# Patient Record
Sex: Female | Born: 1985 | Race: White | Hispanic: No | Marital: Single | Smoking: Current every day smoker
Health system: Southern US, Community
[De-identification: ages and names within clinical notes are randomized; demographics above are authoritative.]

## PROBLEM LIST (undated history)

## (undated) ENCOUNTER — Inpatient Hospital Stay (HOSPITAL_COMMUNITY): Payer: Self-pay

## (undated) DIAGNOSIS — J45909 Unspecified asthma, uncomplicated: Secondary | ICD-10-CM

## (undated) DIAGNOSIS — A4902 Methicillin resistant Staphylococcus aureus infection, unspecified site: Secondary | ICD-10-CM

## (undated) DIAGNOSIS — D649 Anemia, unspecified: Secondary | ICD-10-CM

## (undated) HISTORY — PX: WISDOM TOOTH EXTRACTION: SHX21

## (undated) HISTORY — DX: Methicillin resistant Staphylococcus aureus infection, unspecified site: A49.02

---
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
## 2004-01-15 ENCOUNTER — Emergency Department (HOSPITAL_COMMUNITY): Admission: EM | Admit: 2004-01-15 | Discharge: 2004-01-16 | Payer: Self-pay | Admitting: Emergency Medicine

## 2005-07-28 ENCOUNTER — Emergency Department (HOSPITAL_COMMUNITY): Admission: EM | Admit: 2005-07-28 | Discharge: 2005-07-28 | Payer: Self-pay | Admitting: Emergency Medicine

## 2005-08-17 ENCOUNTER — Emergency Department (HOSPITAL_COMMUNITY): Admission: EM | Admit: 2005-08-17 | Discharge: 2005-08-18 | Payer: Self-pay | Admitting: Emergency Medicine

## 2006-05-30 ENCOUNTER — Emergency Department (HOSPITAL_COMMUNITY): Admission: EM | Admit: 2006-05-30 | Discharge: 2006-05-30 | Payer: Self-pay | Admitting: Emergency Medicine

## 2006-10-12 DIAGNOSIS — J45909 Unspecified asthma, uncomplicated: Secondary | ICD-10-CM | POA: Insufficient documentation

## 2006-10-12 DIAGNOSIS — E669 Obesity, unspecified: Secondary | ICD-10-CM

## 2006-10-12 DIAGNOSIS — L708 Other acne: Secondary | ICD-10-CM

## 2011-12-12 ENCOUNTER — Encounter (HOSPITAL_COMMUNITY): Payer: Self-pay | Admitting: *Deleted

## 2011-12-12 ENCOUNTER — Emergency Department (HOSPITAL_COMMUNITY)
Admission: EM | Admit: 2011-12-12 | Discharge: 2011-12-12 | Disposition: A | Discharge status: Home / Self Care | Payer: Self-pay | Attending: Emergency Medicine | Admitting: Emergency Medicine

## 2011-12-12 DIAGNOSIS — R059 Cough, unspecified: Secondary | ICD-10-CM | POA: Insufficient documentation

## 2011-12-12 DIAGNOSIS — F17200 Nicotine dependence, unspecified, uncomplicated: Secondary | ICD-10-CM | POA: Insufficient documentation

## 2011-12-12 DIAGNOSIS — R05 Cough: Secondary | ICD-10-CM | POA: Insufficient documentation

## 2011-12-12 MED ORDER — AZITHROMYCIN 500 MG PO TABS: ORAL_TABLET | ORAL | Status: DC

## 2011-12-12 MED ORDER — IBUPROFEN 800 MG PO TABS: 800.0000 mg | ORAL_TABLET | Freq: Three times a day (TID) | ORAL | Status: AC

## 2011-12-12 MED ORDER — AZITHROMYCIN 250 MG PO TABS
500.0000 mg | ORAL_TABLET | Freq: Once | ORAL | Status: AC
  Administered 2011-12-12: 500 mg via ORAL
  Filled 2011-12-12: qty 2

## 2011-12-12 NOTE — Discharge Instructions (Signed)

## 2011-12-12 NOTE — ED Provider Notes (Signed)
History     CSN: [id number]  Arrival date & time 12/12/11  1722   First MD Initiated Contact with Patient 12/12/11 1851      Chief Complaint  Patient presents with  . Fever    (Consider location/radiation/quality/duration/timing/severity/associated sxs/prior treatment) Patient is a 26 y.o. female presenting with cough. The history is provided by the patient.  Cough This is a new problem. The current episode started yesterday. The problem occurs every few minutes. The problem has not changed since onset.The cough is productive of sputum. The maximum temperature recorded prior to her arrival was 100 to 100.9 F. The fever has been present for 1 to 2 days. Associated symptoms include chills, headaches, rhinorrhea and myalgias. Pertinent negatives include no ear pain, no sore throat, no shortness of breath and no wheezing. Associated symptoms comments: Body aches. Treatments tried: tylenol. The treatment provided no relief. She is a smoker. Her past medical history does not include bronchitis, pneumonia or asthma.    History reviewed. No pertinent past medical history.  History reviewed. No pertinent past surgical history.  History reviewed. No pertinent family history.  History  Substance Use Topics  . Smoking status: Current Some Day Smoker  . Smokeless tobacco: Not on file  . Alcohol Use: No    OB History    Grav Para Term Preterm Abortions TAB SAB Ect Mult Living                  Review of Systems  Constitutional: Positive for fever and chills. Negative for appetite change.  HENT: Positive for congestion and rhinorrhea. Negative for ear pain, sore throat, trouble swallowing, neck pain and neck stiffness.   Respiratory: Positive for cough. Negative for chest tightness, shortness of breath and wheezing.   Gastrointestinal: Positive for nausea. Negative for vomiting and abdominal pain.  Genitourinary: Negative for dysuria.  Musculoskeletal: Positive for myalgias. Negative for  arthralgias.  Skin: Negative for color change and rash.  Neurological: Positive for headaches. Negative for dizziness, weakness and numbness.  Hematological: Negative for adenopathy.  All other systems reviewed and are negative.    Allergies  Review of patient's allergies indicates no known allergies.  Home Medications   Current Outpatient Rx  Name Route Sig Dispense Refill  . ACETAMINOPHEN 500 MG PO TABS Oral Take 1,000 mg by mouth 2 (two) times daily as needed. For fever      BP 133/74  Pulse 74  Temp 97.7 F (36.5 C)  Resp 20  Ht 5\' 5"  (1.651 m)  Wt 275 lb 1.6 oz (124.785 kg)  BMI 45.78 kg/m2  SpO2 100%  LMP 12/12/2011  Physical Exam  Nursing note and vitals reviewed. Constitutional: She is oriented to person, place, and time. She appears well-developed and well-nourished. No distress.  HENT:  Head: Normocephalic and atraumatic.  Right Ear: Tympanic membrane and ear canal normal.  Left Ear: Tympanic membrane and ear canal normal.  Nose: Mucosal edema and rhinorrhea present.  Mouth/Throat: Uvula is midline, oropharynx is clear and moist and mucous membranes are normal.  Neck: Normal range of motion. Neck supple.  Cardiovascular: Normal rate, regular rhythm and normal heart sounds.   Pulmonary/Chest: Effort normal. No respiratory distress. She exhibits no tenderness.       Coarse lungs sounds bilaterally w/o rales or wheezing  Abdominal: Soft. She exhibits no distension. There is no tenderness.  Musculoskeletal: Normal range of motion. She exhibits no edema and no tenderness.  Lymphadenopathy:    She has no cervical  adenopathy.  Neurological: She is alert and oriented to person, place, and time. She exhibits normal muscle tone. Coordination normal.  Skin: Skin is warm and dry.    ED Course  Procedures (including critical care time)       MDM    Previous medical charts, nursing notes and vitals signs from this visit were reviewed by me   All laboratory  results and/or imaging results performed on this visit, if applicable, were reviewed by me and discussed with the patient and/or parent as well as recommendation for follow-up    MEDICATIONS GIVEN IN ED: zithromax  Patient is alert, non-toxic appearing.  No meningeal signs, abd is soft NT. No hypoxia.   Ambulates well.  Coarse lungs sounds bilaterally w/o rales or wheezing.  Pt agrees to f/u with her PMD or return here if her sx's worsen      PRESCRIPTIONS GIVEN AT DISCHARGE:  z-pack, ibuprofen      Pt stable in ED with no significant deterioration in condition. Pt feels improved after observation and/or treatment in ED. Patient / Family / Caregiver understand and agree with initial ED impression and plan with expectations set for ED visit.  Patient agrees to return to ED for any worsening symptoms        Braylea Brancato L. [place], [place] 12/16/11 2142

## 2011-12-12 NOTE — ED Notes (Signed)
Sick since yesterday with cough,productive green brown sputum,  Headache, Fever.nausea

## 2011-12-17 NOTE — ED Provider Notes (Signed)
Medical screening examination/treatment/procedure(s) were performed by non-physician practitioner and as supervising physician I was immediately available for consultation/collaboration.  Dahlia Nifong S. Fritzie Prioleau, MD 12/17/11 2319 

## 2012-04-26 ENCOUNTER — Encounter (HOSPITAL_COMMUNITY): Payer: Self-pay | Admitting: Emergency Medicine

## 2012-04-26 ENCOUNTER — Emergency Department (HOSPITAL_COMMUNITY)
Admission: EM | Admit: 2012-04-26 | Discharge: 2012-04-26 | Disposition: A | Discharge status: Home / Self Care | Payer: Self-pay | Attending: Emergency Medicine | Admitting: Emergency Medicine

## 2012-04-26 DIAGNOSIS — J40 Bronchitis, not specified as acute or chronic: Secondary | ICD-10-CM | POA: Insufficient documentation

## 2012-04-26 DIAGNOSIS — F17200 Nicotine dependence, unspecified, uncomplicated: Secondary | ICD-10-CM | POA: Insufficient documentation

## 2012-04-26 MED ORDER — AMOXICILLIN 500 MG PO CAPS: 500.0000 mg | ORAL_CAPSULE | Freq: Three times a day (TID) | ORAL | Status: AC

## 2012-04-26 NOTE — ED Notes (Signed)
Pt c/o sore throat, sob, and productive cough.

## 2012-04-26 NOTE — ED Provider Notes (Signed)
History     CSN: [id number]  Arrival date & time 04/26/12  1404   First MD Initiated Contact with Patient 04/26/12 1454      Chief Complaint  Patient presents with  . Sore Throat  . Shortness of Breath  . Cough    (Consider location/radiation/quality/duration/timing/severity/associated sxs/prior treatment) Patient is a 26 y.o. female presenting with pharyngitis, shortness of breath, and cough. The history is provided by the patient (pt complains of cough and sore throat). No language interpreter was used.  Sore Throat This is a new problem. The current episode started 12 to 24 hours ago. The problem occurs hourly. The problem has not changed since onset.Associated symptoms include shortness of breath. Pertinent negatives include no chest pain, no abdominal pain and no headaches. Nothing aggravates the symptoms. Nothing relieves the symptoms. She has tried nothing for the symptoms. The treatment provided no relief.  Shortness of Breath  Associated symptoms include sore throat, cough and shortness of breath. Pertinent negatives include no chest pain.  Cough Associated symptoms include sore throat and shortness of breath. Pertinent negatives include no chest pain and no headaches.    History reviewed. No pertinent past medical history.  History reviewed. No pertinent past surgical history.  History reviewed. No pertinent family history.  History  Substance Use Topics  . Smoking status: Current Some Day Smoker  . Smokeless tobacco: Not on file  . Alcohol Use: No    OB History    Grav Para Term Preterm Abortions TAB SAB Ect Mult Living                  Review of Systems  Constitutional: Negative for fatigue.  HENT: Positive for sore throat. Negative for congestion, sinus pressure and ear discharge.   Eyes: Negative for discharge.  Respiratory: Positive for cough and shortness of breath.   Cardiovascular: Negative for chest pain.  Gastrointestinal: Negative for abdominal  pain and diarrhea.  Genitourinary: Negative for frequency and hematuria.  Musculoskeletal: Negative for back pain.  Skin: Negative for rash.  Neurological: Negative for seizures and headaches.  Hematological: Negative.   Psychiatric/Behavioral: Negative for hallucinations.    Allergies  Review of patient's allergies indicates no known allergies.  Home Medications   Current Outpatient Rx  Name Route Sig Dispense Refill  . GUAIFENESIN-DM 100-10 MG/5ML PO SYRP Oral Take 5 mLs by mouth 3 (three) times daily as needed. For congestion    . AMOXICILLIN 500 MG PO CAPS Oral Take 1 capsule (500 mg total) by mouth 3 (three) times daily. 21 capsule 0    BP 149/98  Pulse 73  Temp 98.8 F (37.1 C) (Oral)  Resp 20  Ht 5\' 5"  (1.651 m)  Wt 258 lb (117.028 kg)  BMI 42.93 kg/m2  SpO2 100%  LMP 04/25/2012  Physical Exam  Constitutional: She is oriented to person, place, and time. She appears well-developed.  HENT:  Head: Normocephalic and atraumatic.  Eyes: Conjunctivae normal and EOM are normal. No scleral icterus.  Neck: Neck supple. No thyromegaly present.  Cardiovascular: Normal rate and regular rhythm.  Exam reveals no gallop and no friction rub.   No murmur heard. Pulmonary/Chest: No stridor. She has no wheezes. She has no rales. She exhibits no tenderness.  Abdominal: She exhibits no distension. There is no tenderness. There is no rebound.  Musculoskeletal: Normal range of motion. She exhibits no edema.  Lymphadenopathy:    She has no cervical adenopathy.  Neurological: She is oriented to person, place, and  time. Coordination normal.  Skin: No rash noted. No erythema.  Psychiatric: She has a normal mood and affect. Her behavior is normal.    ED Course  Procedures (including critical care time)  Labs Reviewed - No data to display No results found.   1. Bronchitis       MDM           Benny Lennert, MD 04/26/12 1500

## 2012-05-30 ENCOUNTER — Encounter (HOSPITAL_COMMUNITY): Payer: Self-pay | Admitting: *Deleted

## 2012-05-30 ENCOUNTER — Emergency Department (HOSPITAL_COMMUNITY)
Admission: EM | Admit: 2012-05-30 | Discharge: 2012-05-30 | Disposition: A | Discharge status: Home / Self Care | Payer: Self-pay | Attending: Emergency Medicine | Admitting: Emergency Medicine

## 2012-05-30 DIAGNOSIS — K0889 Other specified disorders of teeth and supporting structures: Secondary | ICD-10-CM

## 2012-05-30 DIAGNOSIS — X58XXXA Exposure to other specified factors, initial encounter: Secondary | ICD-10-CM | POA: Insufficient documentation

## 2012-05-30 DIAGNOSIS — S025XXA Fracture of tooth (traumatic), initial encounter for closed fracture: Secondary | ICD-10-CM | POA: Insufficient documentation

## 2012-05-30 DIAGNOSIS — F17200 Nicotine dependence, unspecified, uncomplicated: Secondary | ICD-10-CM | POA: Insufficient documentation

## 2012-05-30 DIAGNOSIS — K029 Dental caries, unspecified: Secondary | ICD-10-CM | POA: Insufficient documentation

## 2012-05-30 DIAGNOSIS — K047 Periapical abscess without sinus: Secondary | ICD-10-CM

## 2012-05-30 MED ORDER — HYDROCODONE-ACETAMINOPHEN 5-325 MG PO TABS: ORAL_TABLET | ORAL | Status: DC

## 2012-05-30 MED ORDER — PENICILLIN V POTASSIUM 500 MG PO TABS: 500.0000 mg | ORAL_TABLET | Freq: Four times a day (QID) | ORAL | Status: AC

## 2012-05-30 MED ORDER — HYDROCODONE-ACETAMINOPHEN 5-325 MG PO TABS
2.0000 | ORAL_TABLET | Freq: Once | ORAL | Status: AC
  Administered 2012-05-30: 2 via ORAL
  Filled 2012-05-30: qty 2

## 2012-05-30 NOTE — ED Provider Notes (Signed)
History     CSN: [id number]  Arrival date & time 05/30/12  4098   First MD Initiated Contact with Patient 05/30/12 [phone]      Chief Complaint  Patient presents with  . Dental Pain    (Consider location/radiation/quality/duration/timing/severity/associated sxs/prior treatment) The history is provided by the patient, a parent and medical records.    Dorothy Flowers is a 26 y.o. female presents to the emergency department complaining of dental pain on the top L.  The onset of the symptoms was abrupt starting 4 days ago.  The patient has associated L ear pain, decreased appetite, pain in the L jaw, fever, headache.  The symptoms have been  persistent, gradually worsened.  Eating, drinking, smoking, talking makes the symptoms worse and nothing makes symptoms better.  The patient denies chills, sore throat, nausea, vomiting, diarrhea, abdominal pain.  Pt states the tooth broke about 1 week ago while she was eating.   Pt had a fever to 100.8 yesterday.  She has been taking ibuprofen 600mg  TID.  Pt has an appointment on Oct 25th with dentistry.  Pt denies Hx of coryza, cough, congestion, rhinorrhea, sore throat.    History reviewed. No pertinent past medical history.  History reviewed. No pertinent past surgical history.  No family history on file.  History  Substance Use Topics  . Smoking status: Current Some Day Smoker -- 0.5 packs/day    Types: Cigarettes  . Smokeless tobacco: Not on file  . Alcohol Use: No    OB History    Grav Para Term Preterm Abortions TAB SAB Ect Mult Living                  Review of Systems  Constitutional: Positive for fever. Negative for chills and appetite change.  HENT: Positive for ear pain, facial swelling and dental problem. Negative for nosebleeds, rhinorrhea, drooling, trouble swallowing, neck pain, neck stiffness and postnasal drip.   Eyes: Negative for pain and redness.  Respiratory: Negative for cough and wheezing.   Cardiovascular:  Negative for chest pain.  Gastrointestinal: Negative for nausea, vomiting and abdominal pain.  Skin: Negative for color change and rash.  Neurological: Positive for headaches. Negative for weakness and light-headedness.  All other systems reviewed and are negative.    Allergies  Review of patient's allergies indicates no known allergies.  Home Medications   Current Outpatient Rx  Name Route Sig Dispense Refill  . IBUPROFEN 200 MG PO TABS Oral Take 600 mg by mouth every 6 (six) hours as needed. For pain    . HYDROCODONE-ACETAMINOPHEN 5-325 MG PO TABS  1-2 tablets as needed for pain control 21 tablet 0  . PENICILLIN V POTASSIUM 500 MG PO TABS Oral Take 1 tablet (500 mg total) by mouth 4 (four) times daily. 40 tablet 0    BP 145/94  Temp 98.3 F (36.8 C) (Oral)  Resp 18  SpO2 96%  Physical Exam  Nursing note and vitals reviewed. Constitutional: She appears well-developed and well-nourished.  HENT:  Head: Normocephalic.  Right Ear: Tympanic membrane, external ear and ear canal normal. No drainage. No mastoid tenderness. Tympanic membrane is not bulging.  Left Ear: Tympanic membrane, external ear and ear canal normal. No drainage. No mastoid tenderness. Tympanic membrane is not bulging.  Nose: Nose normal. Right sinus exhibits no maxillary sinus tenderness and no frontal sinus tenderness. Left sinus exhibits no maxillary sinus tenderness and no frontal sinus tenderness.  Mouth/Throat: Uvula is midline, oropharynx is clear and moist  and mucous membranes are normal. No oral lesions. Abnormal dentition. Dental caries present. No dental abscesses, uvula swelling or lacerations. No oropharyngeal exudate, posterior oropharyngeal edema, posterior oropharyngeal erythema or tonsillar abscesses.         Broken molar on Left upper with dental carie Generally poor dentition with multiple other caries throughout   Eyes: Conjunctivae normal are normal. Right eye exhibits no discharge. Left eye  exhibits no discharge.  Neck: Normal range of motion. Neck supple.  Cardiovascular: Normal rate, regular rhythm and normal heart sounds.   Pulmonary/Chest: Effort normal and breath sounds normal. No respiratory distress. She has no wheezes.  Abdominal: Soft. Bowel sounds are normal. She exhibits no distension. There is no tenderness.  Lymphadenopathy:    She has no cervical adenopathy.  Neurological: She is alert.  Skin: Skin is warm and dry.  Psychiatric: She has a normal mood and affect.    ED Course  Procedures (including critical care time)  Labs Reviewed - No data to display No results found.   1. Pain, dental   2. Dental caries   3. Dental infection       MDM  Dorothy Flowers presents with dental pain and associated L ear pain.  Dental infection likely.  No evidence of dental abscess that needs draining.  Exam unconcerning for Ludwig's angina or spread of infection.  Will treat with penicillin and pain medicine.  Urged patient to keep her currently schedule appointment and follow-up with dentist.  I have also discussed reasons to return immediately to the ER.  Patient expresses understanding and agrees with plan.  1. Medications: penicillin, vicoden 2. Treatment: rest, drink plenty of fluids, take medications as prescribed 3. Follow Up: with dentistry          Dierdre Forth, PA-C 05/30/12 [phone]

## 2012-05-30 NOTE — ED Notes (Signed)
NAD noted at time of d/c home 

## 2012-05-30 NOTE — ED Provider Notes (Signed)
Medical screening examination/treatment/procedure(s) were performed by non-physician practitioner and as supervising physician I was immediately available for consultation/collaboration.  Ethelda Chick, MD 05/30/12 [phone]

## 2012-05-30 NOTE — ED Notes (Signed)
Pt to ED c/o L ear pain 4 days ago. Pain has increased to L jaw.

## 2012-05-30 NOTE — ED Notes (Signed)
Pt c/o L upper dental pain x 4 days.  States on Friday she felt something "crack".  Pain 8/10, has been taking 600 mg ibuprofen, 3 times a day.  Denies n/v, weakness, SOB.

## 2012-06-08 ENCOUNTER — Encounter (HOSPITAL_COMMUNITY): Payer: Self-pay | Admitting: Emergency Medicine

## 2012-06-08 ENCOUNTER — Emergency Department (HOSPITAL_COMMUNITY)
Admission: EM | Admit: 2012-06-08 | Discharge: 2012-06-08 | Disposition: A | Discharge status: Home / Self Care | Payer: Self-pay | Attending: Emergency Medicine | Admitting: Emergency Medicine

## 2012-06-08 DIAGNOSIS — Z87891 Personal history of nicotine dependence: Secondary | ICD-10-CM | POA: Insufficient documentation

## 2012-06-08 DIAGNOSIS — K089 Disorder of teeth and supporting structures, unspecified: Secondary | ICD-10-CM | POA: Insufficient documentation

## 2012-06-08 DIAGNOSIS — K0889 Other specified disorders of teeth and supporting structures: Secondary | ICD-10-CM

## 2012-06-08 NOTE — ED Notes (Signed)
Reports 2 weeks ago was dx with dental abscess, sent home on pcn and finished abx, went to dentist last Wednesday, to have tooth pulled, and wouldn't pull it d/t it was still infected--pt states dentist sent her back her for abx

## 2012-06-08 NOTE — ED Provider Notes (Signed)
History     CSN: [id number]  Arrival date & time 06/08/12  1513   First MD Initiated Contact with Patient 06/08/12 1530      Chief Complaint  Patient presents with  . Dental Problem    (Consider location/radiation/quality/duration/timing/severity/associated sxs/prior treatment) HPI Pt seen in the ED for dental pain about 10 days ago given RX for pain meds and Abx, states she saw a dentist in [place] 2 days ago who would not pull her tooth due to infection and reportedly advised her to come back to the ED for additional treatment even though she did not finish her antibiotics until today. Pain is moderate aching. Swelling is improved.   History reviewed. No pertinent past medical history.  History reviewed. No pertinent past surgical history.  History reviewed. No pertinent family history.  History  Substance Use Topics  . Smoking status: Former Smoker    Quit date: 05/30/2012  . Smokeless tobacco: Not on file  . Alcohol Use: No    OB History    Grav Para Term Preterm Abortions TAB SAB Ect Mult Living                  Review of Systems All other systems reviewed and are negative except as noted in HPI.   Allergies  Review of patient's allergies indicates no known allergies.  Home Medications   Current Outpatient Rx  Name Route Sig Dispense Refill  . IBUPROFEN 200 MG PO TABS Oral Take 1,000 mg by mouth every 6 (six) hours as needed. For pain      BP 137/94  Pulse 78  Temp 98 F (36.7 C) (Oral)  Resp 19  SpO2 99%  LMP 05/29/2012  Physical Exam  Constitutional: She is oriented to person, place, and time. She appears well-developed and well-nourished.  HENT:  Head: Normocephalic and atraumatic.       Multiple dental caries, no significant gingival swelling and no abscess to L upper 2nd molar  Neck: Neck supple.  Pulmonary/Chest: Effort normal.  Neurological: She is alert and oriented to person, place, and time. No cranial nerve deficit.  Psychiatric: She  has a normal mood and affect. Her behavior is normal.    ED Course  Procedures (including critical care time)  Labs Reviewed - No data to display No results found.   No diagnosis found.    MDM  Pt finished PCN today, no need for additional Rx. Advised dental follow-up with a different dentist who would be willing to treat her.        Brittain Hosie B. Bernette Mayers, MD 06/08/12 1539

## 2014-03-19 ENCOUNTER — Encounter (HOSPITAL_COMMUNITY): Payer: Self-pay | Admitting: Emergency Medicine

## 2014-03-19 ENCOUNTER — Emergency Department (HOSPITAL_COMMUNITY)
Admission: EM | Admit: 2014-03-19 | Discharge: 2014-03-19 | Disposition: A | Discharge status: Home / Self Care | Payer: Self-pay | Attending: Emergency Medicine | Admitting: Emergency Medicine

## 2014-03-19 DIAGNOSIS — K089 Disorder of teeth and supporting structures, unspecified: Secondary | ICD-10-CM | POA: Insufficient documentation

## 2014-03-19 DIAGNOSIS — F17200 Nicotine dependence, unspecified, uncomplicated: Secondary | ICD-10-CM | POA: Insufficient documentation

## 2014-03-19 DIAGNOSIS — K0530 Chronic periodontitis, unspecified: Secondary | ICD-10-CM | POA: Insufficient documentation

## 2014-03-19 DIAGNOSIS — K029 Dental caries, unspecified: Secondary | ICD-10-CM | POA: Insufficient documentation

## 2014-03-19 MED ORDER — OXYCODONE-ACETAMINOPHEN 5-325 MG PO TABS
1.0000 | ORAL_TABLET | Freq: Once | ORAL | Status: AC
  Administered 2014-03-19: 1 via ORAL
  Filled 2014-03-19: qty 1

## 2014-03-19 MED ORDER — AMOXICILLIN 500 MG PO CAPS: 500.0000 mg | ORAL_CAPSULE | Freq: Three times a day (TID) | ORAL | Status: DC

## 2014-03-19 MED ORDER — AMOXICILLIN 250 MG PO CAPS
500.0000 mg | ORAL_CAPSULE | Freq: Once | ORAL | Status: AC
  Administered 2014-03-19: 500 mg via ORAL
  Filled 2014-03-19: qty 2

## 2014-03-19 MED ORDER — TRAMADOL HCL 50 MG PO TABS: 50.0000 mg | ORAL_TABLET | Freq: Four times a day (QID) | ORAL | Status: DC | PRN

## 2014-03-19 NOTE — ED Provider Notes (Signed)
CSN: [id number]     Arrival date & time 03/19/14  1028 History   First MD Initiated Contact with Patient 03/19/14 1031     Chief Complaint  Patient presents with  . Dental Pain     (Consider location/radiation/quality/duration/timing/severity/associated sxs/prior Treatment) Patient is a 28 y.o. female presenting with tooth pain. The history is provided by the patient.  Dental Pain Location:  Upper and lower Upper teeth location:  1/RU 3rd molar and 16/LU 3rd molar Lower teeth location:  17/LL 3rd molar and 31/RL 2nd molar Quality:  Throbbing Severity:  Severe Onset quality:  Gradual Duration:  3 days Timing:  Constant Progression:  Worsening Chronicity:  New  Dorothy Flowers is a age female who presents to the ED with dental pain that started 3 days ago. She could not get an appointment with a dentist for 3 weeks.   History reviewed. No pertinent past medical history. History reviewed. No pertinent past surgical history. No family history on file. History  Substance Use Topics  . Smoking status: Current Every Day Smoker    Types: Cigarettes  . Smokeless tobacco: Not on file  . Alcohol Use: No   OB History   Grav Para Term Preterm Abortions TAB SAB Ect Mult Living                 Review of Systems Negative except as stated in HPI   Allergies  Review of patient's allergies indicates no known allergies.  Home Medications   Prior to Admission medications   Medication Sig Start Date End Date Taking? Authorizing Provider  ibuprofen (ADVIL,MOTRIN) 200 MG tablet Take 1,000 mg by mouth every 6 (six) hours as needed. For pain    Historical Provider, MD   BP 119/75  Pulse 100  Temp(Src) 98.8 F (37.1 C) (Oral)  Resp 16  Ht 5\' 6"  (1.676 m)  Wt 270 lb (122.471 kg)  BMI 43.60 kg/m2  SpO2 100%  LMP 02/26/2014 Physical Exam  Nursing note and vitals reviewed. Constitutional: She is oriented to person, place, and time. She appears well-developed and well-nourished.    HENT:  Mouth/Throat: Uvula is midline, oropharynx is clear and moist and mucous membranes are normal.    There is decay and tenderness of the third molars upper and lower. There is swelling and tenderness of the gum surrounding the molars.   Eyes: EOM are normal.  Neck: Neck supple.  Pulmonary/Chest: Effort normal.  Abdominal: Soft. There is no tenderness.  Musculoskeletal: Normal range of motion.  Lymphadenopathy:    She has cervical adenopathy.  Neurological: She is alert and oriented to person, place, and time. No cranial nerve deficit.  Skin: Skin is warm and dry.    ED Course  Procedures   MDM  28 y.o. female with pain and decay of the third upper and lower molars with swelling of the gum surrounding the teeth. Will treat for infection and pain. The patient will keep the follow up appointment she has with the dentist. She will return here as needed. Discussed with the patient and all questioned fully answered.    Medication List    TAKE these medications       amoxicillin 500 MG capsule  Commonly known as:  AMOXIL  Take 1 capsule (500 mg total) by mouth 3 (three) times daily.     traMADol 50 MG tablet  Commonly known as:  ULTRAM  Take 1 tablet (50 mg total) by mouth every 6 (six) hours as needed.  ASK your doctor about these medications       ibuprofen 200 MG tablet  Commonly known as:  ADVIL,MOTRIN  Take 1,000 mg by mouth every 6 (six) hours as needed. For pain           Janne Napoleon, [place] 03/19/14 [phone]

## 2014-03-19 NOTE — Discharge Instructions (Signed)
See the dentist as soon as possible for follow up.   Dental Caries Dental caries (also called tooth decay) is the most common oral disease. It can occur at any age but is more common in children and young adults.  HOW DENTAL CARIES DEVELOPS  The process of decay begins when bacteria and foods (particularly sugars and starches) combine in your mouth to produce plaque. Plaque is a substance that sticks to the hard, outer surface of a tooth (enamel). The bacteria in plaque produce acids that attack enamel. These acids may also attack the root surface of a tooth (cementum) if it is exposed. Repeated attacks dissolve these surfaces and create holes in the tooth (cavities). If left untreated, the acids destroy the other layers of the tooth.  RISK FACTORS  Frequent sipping of sugary beverages.   Frequent snacking on sugary and starchy foods, especially those that easily get stuck in the teeth.   Poor oral hygiene.   Dry mouth.   Substance abuse such as methamphetamine abuse.   Broken or poor-fitting dental restorations.   Eating disorders.   Gastroesophageal reflux disease (GERD).   Certain radiation treatments to the head and neck. SYMPTOMS In the early stages of dental caries, symptoms are seldom present. Sometimes white, chalky areas may be seen on the enamel or other tooth layers. In later stages, symptoms may include:  Pits and holes on the enamel.  Toothache after sweet, hot, or cold foods or drinks are consumed.  Pain around the tooth.  Swelling around the tooth. DIAGNOSIS  Most of the time, dental caries is detected during a regular dental checkup. A diagnosis is made after a thorough medical and dental history is taken and the surfaces of your teeth are checked for signs of dental caries. Sometimes special instruments, such as lasers, are used to check for dental caries. Dental X-ray exams may be taken so that areas not visible to the eye (such as between the contact  areas of the teeth) can be checked for cavities.  TREATMENT  If dental caries is in its early stages, it may be reversed with a fluoride treatment or an application of a remineralizing agent at the dental office. Thorough brushing and flossing at home is needed to aid these treatments. If it is in its later stages, treatment depends on the location and extent of tooth destruction:   If a small area of the tooth has been destroyed, the destroyed area will be removed and cavities will be filled with a material such as gold, silver amalgam, or composite resin.   If a large area of the tooth has been destroyed, the destroyed area will be removed and a cap (crown) will be fitted over the remaining tooth structure.   If the center part of the tooth (pulp) is affected, a procedure called a root canal will be needed before a filling or crown can be placed.   If most of the tooth has been destroyed, the tooth may need to be pulled (extracted). HOME CARE INSTRUCTIONS You can prevent, stop, or reverse dental caries at home by practicing good oral hygiene. Good oral hygiene includes:  Thoroughly cleaning your teeth at least twice a day with a toothbrush and dental floss.   Using a fluoride toothpaste. A fluoride mouth rinse may also be used if recommended by your dentist or health care provider.   Restricting the amount of sugary and starchy foods and sugary liquids you consume.   Avoiding frequent snacking on these foods  and sipping of these liquids.   Keeping regular visits with a dentist for checkups and cleanings. PREVENTION   Practice good oral hygiene.  Consider a dental sealant. A dental sealant is a coating material that is applied by your dentist to the pits and grooves of teeth. The sealant prevents food from being trapped in them. It may protect the teeth for several years.  Ask about fluoride supplements if you live in a community without fluorinated water or with water that has a  low fluoride content. Use fluoride supplements as directed by your dentist or health care provider.  Allow fluoride varnish applications to teeth if directed by your dentist or health care provider. Document Released: 04/23/2002 Document Revised: 12/16/2013 Document Reviewed: 08/03/2012 [facility] Patient Information 2015 [place], [place]. This information is not intended to replace advice given to you by your health care provider. Make sure you discuss any questions you have with your health care provider.  Dental Pain Toothache is pain in or around a tooth. It may get worse with chewing or with cold or heat.  HOME CARE  Your dentist may use a numbing medicine during treatment. If so, you may need to avoid eating until the medicine wears off. Ask your dentist about this.  Only take medicine as told by your dentist or doctor.  Avoid chewing food near the painful tooth until after all treatment is done. Ask your dentist about this. GET HELP RIGHT AWAY IF:   The problem gets worse or new problems appear.  You have a fever.  There is redness and puffiness (swelling) of the face, jaw, or neck.  You cannot open your mouth.  There is pain in the jaw.  There is very bad pain that is not helped by medicine. MAKE SURE YOU:   Understand these instructions.  Will watch your condition.  Will get help right away if you are not doing well or get worse. Document Released: 01/18/2008 Document Revised: 10/24/2011 Document Reviewed: 01/18/2008 [facility] Patient Information 2015 [place], [place]. This information is not intended to replace advice given to you by your health care provider. Make sure you discuss any questions you have with your health care provider.

## 2014-03-19 NOTE — ED Notes (Signed)
Pt states dental pain began Monday, states shooting pain to ear and head. Unable to see dentist x 3 weeks.

## 2014-03-25 NOTE — ED Provider Notes (Signed)
Medical screening examination/treatment/procedure(s) were performed by non-physician practitioner and as supervising physician I was immediately available for consultation/collaboration.   EKG Interpretation None        Cherene Dobbins L Makaia Rappa, MD 03/25/14 0910 

## 2014-05-11 ENCOUNTER — Emergency Department (HOSPITAL_COMMUNITY)
Admission: EM | Admit: 2014-05-11 | Discharge: 2014-05-11 | Disposition: A | Discharge status: Home / Self Care | Payer: Self-pay | Attending: Emergency Medicine | Admitting: Emergency Medicine

## 2014-05-11 ENCOUNTER — Encounter (HOSPITAL_COMMUNITY): Payer: Self-pay | Admitting: Emergency Medicine

## 2014-05-11 DIAGNOSIS — Z792 Long term (current) use of antibiotics: Secondary | ICD-10-CM | POA: Insufficient documentation

## 2014-05-11 DIAGNOSIS — T7840XA Allergy, unspecified, initial encounter: Secondary | ICD-10-CM

## 2014-05-11 DIAGNOSIS — K0889 Other specified disorders of teeth and supporting structures: Secondary | ICD-10-CM

## 2014-05-11 DIAGNOSIS — K029 Dental caries, unspecified: Secondary | ICD-10-CM | POA: Insufficient documentation

## 2014-05-11 DIAGNOSIS — R221 Localized swelling, mass and lump, neck: Secondary | ICD-10-CM

## 2014-05-11 DIAGNOSIS — T361X5A Adverse effect of cephalosporins and other beta-lactam antibiotics, initial encounter: Secondary | ICD-10-CM | POA: Insufficient documentation

## 2014-05-11 DIAGNOSIS — R220 Localized swelling, mass and lump, head: Secondary | ICD-10-CM | POA: Insufficient documentation

## 2014-05-11 DIAGNOSIS — F17200 Nicotine dependence, unspecified, uncomplicated: Secondary | ICD-10-CM | POA: Insufficient documentation

## 2014-05-11 DIAGNOSIS — K089 Disorder of teeth and supporting structures, unspecified: Secondary | ICD-10-CM | POA: Insufficient documentation

## 2014-05-11 MED ORDER — PREDNISONE 10 MG PO TABS
60.0000 mg | ORAL_TABLET | Freq: Once | ORAL | Status: AC
  Administered 2014-05-11: 60 mg via ORAL
  Filled 2014-05-11 (×2): qty 1

## 2014-05-11 MED ORDER — PREDNISONE 20 MG PO TABS: 40.0000 mg | ORAL_TABLET | Freq: Every day | ORAL | Status: DC

## 2014-05-11 MED ORDER — FAMOTIDINE 20 MG PO TABS
40.0000 mg | ORAL_TABLET | Freq: Once | ORAL | Status: AC
  Administered 2014-05-11: 40 mg via ORAL
  Filled 2014-05-11: qty 2

## 2014-05-11 MED ORDER — CLINDAMYCIN HCL 150 MG PO CAPS: ORAL_CAPSULE | ORAL | Status: DC

## 2014-05-11 MED ORDER — DIPHENHYDRAMINE HCL 25 MG PO CAPS
50.0000 mg | ORAL_CAPSULE | Freq: Once | ORAL | Status: AC
  Administered 2014-05-11: 50 mg via ORAL
  Filled 2014-05-11: qty 2

## 2014-05-11 MED ORDER — HYDROCODONE-ACETAMINOPHEN 5-325 MG PO TABS: ORAL_TABLET | ORAL | Status: DC

## 2014-05-11 MED ORDER — CLINDAMYCIN HCL 150 MG PO CAPS
450.0000 mg | ORAL_CAPSULE | Freq: Once | ORAL | Status: AC
  Administered 2014-05-11: 450 mg via ORAL
  Filled 2014-05-11: qty 3

## 2014-05-11 NOTE — ED Provider Notes (Signed)
CSN: [id number]     Arrival date & time 05/11/14  1714 History   First MD Initiated Contact with Patient 05/11/14 1749     Chief Complaint  Patient presents with  . Facial Swelling  . Allergic Reaction      HPI Pt was seen at 1755. Per pt and her mother, c/o gradual onset and persistence of constant right sided facial "pain" and "swelling" for the past 3 days. Pt's mother states pt had her right upper wisdom tooth extracted 2 weeks ago and is due to have several more teeth extracted in the upcoming weeks. Pt states she has been having mild pain in her right upper and lower teeth for the past 5 days, so she called her Dentist. Pt's Dentist started her on Keflex 3 days ago. Pt states since she started the keflex, she feels her right face and tongue have "gotten swollen." Pt left work today to come to the ED for evaluation. Pt's mother states she is concerned that pt "is allergic to the antibiotic" and "the antibiotic isn't working anyway." Denies fevers, no rash/hives, no dysphagia, no hoarse voice, no SOB/wheezing.    History reviewed. No pertinent past medical history.  Past Surgical History  Procedure Laterality Date  . Wisdom tooth extraction     History  Substance Use Topics  . Smoking status: Current Every Day Smoker -- 0.50 packs/day    Types: Cigarettes  . Smokeless tobacco: Not on file  . Alcohol Use: No    Review of Systems ROS: Statement: All systems negative except as marked or noted in the HPI; Constitutional: Negative for fever and chills. ; ; Eyes: Negative for eye pain and discharge. ; ; ENMT: Positive for "right sided facial swelling" and "right sided tongue swelling," dental caries, dental hygiene poor and toothache. Negative for ear pain, bleeding gums, dental injury, facial deformity, hoarseness, nasal congestion, sinus pressure, sore throat, throat swelling. ; ; Cardiovascular: Negative for chest pain, palpitations, diaphoresis, dyspnea and peripheral edema. ; ;  Respiratory: Negative for cough, wheezing and stridor. ; ; Gastrointestinal: Negative for nausea, vomiting, diarrhea and abdominal pain. ; ; Genitourinary: Negative for dysuria, flank pain and hematuria. ; ; Musculoskeletal: Negative for back pain and neck pain. ; ; Skin: Negative for rash and skin lesion. ; ; Neuro: Negative for headache, lightheadedness and neck stiffness.      Allergies  Review of patient's allergies indicates no known allergies.  Home Medications   Prior to Admission medications   Medication Sig Start Date End Date Taking? Authorizing Provider  amoxicillin (AMOXIL) 500 MG capsule Take 1 capsule (500 mg total) by mouth 3 (three) times daily. 03/19/14   Hope Orlene Och, NP  ibuprofen (ADVIL,MOTRIN) 200 MG tablet Take 1,000 mg by mouth every 6 (six) hours as needed. For pain    Historical Provider, MD  traMADol (ULTRAM) 50 MG tablet Take 1 tablet (50 mg total) by mouth every 6 (six) hours as needed. 03/19/14   Hope Orlene Och, NP   BP 127/93  Pulse 70  Temp(Src) 98.1 F (36.7 C) (Oral)  Resp 19  Ht  (1.676 m)  Wt 270 lb (122.471 kg)  BMI 43.60 kg/m2  SpO2 100%  LMP 04/21/2014 Physical Exam 1800: Physical examination: Vital signs and O2 SAT: Reviewed; Constitutional: Well developed, Well nourished, Well hydrated, In no acute distress; Head and Face: Normocephalic, Atraumatic. No facial swelling. ; Eyes: EOMI, PERRL, No scleral icterus; ENMT: Mouth and pharynx normal, Poor dentition, Widespread dental decay,  Left TM normal, Right TM normal, Mucous membranes moist, +upper right wisdom tooth extraction site healed without open wound, no drainage, no fluctuance, no edema, no erythema, no ecchymosis. +lower right 2nd molar, upper and lower left molars with extensive dental decay.  No gingival erythema, edema, fluctuance, or drainage.  No intra-oral edema. No submandibular or sublingual edema. When pt is distracted she has no hoarse or muffled voice. No drooling, no stridor. No  trismus. ; Neck: Supple, Full range of motion, No lymphadenopathy; Cardiovascular: Regular rate and rhythm, No murmur, rub, or gallop; Respiratory: Breath sounds clear & equal bilaterally, No rales, rhonchi, wheezes, Normal respiratory effort/excursion; Chest: Nontender, Movement normal; Extremities: Pulses normal, No tenderness, No edema; Neuro: AA&Ox3, Major CN grossly intact.  No gross focal motor or sensory deficits in extremities. Climbs on and off stretcher easily by herself. Gait steady.; Skin: Color normal, No diffuse rash, No petechiae, Warm, Dry    ED Course  Procedures     MDM  MDM Reviewed: previous chart, nursing note and vitals    1805:  Pt was mumbling quietly initially, but when I told her that I could not understand her, she spoke in a loud clear voice. Pt continued to speak with a clear voice throughout my H&P. No intra-oral edema noted and pt does not have rash/hives. Wisdom tooth extraction site well healed and without signs of infection. Pt with multiple dental caries with significant dental decay. No signs of Ludwig's at this time. Source of pt's symptoms is likely her multiple dental caries, but confounding factor of recent new abx (keflex) is present. Will tx for presumed allergic reaction, as well as change abx to clindamycin. Will dose meds in ED and continue to observe. Pt and her mother agreeable with plan.    2030:  Pt states she "feels better now" and wants to go home. No intra-oral edema, no SOB, no rash. Voice clear, lungs CTA bilat, Sats 98% R/A. Pt has tol PO well without choking/gagging/dysphagia. Dx and testing d/w pt and family.  Questions answered.  Verb understanding, agreeable to d/c home with outpt f/u.     Samuel Jester, DO 05/14/14 [place]

## 2014-05-11 NOTE — Discharge Instructions (Signed)
°Emergency Department Resource Guide °1) Find a Doctor and Pay Out of Pocket °Although you won't have to find out who is covered by your insurance plan, it is a good idea to ask around and get recommendations. You will then need to call the office and see if the doctor you have chosen will accept you as a new patient and what types of options they offer for patients who are self-pay. Some doctors offer discounts or will set up payment plans for their patients who do not have insurance, but you will need to ask so you aren't surprised when you get to your appointment. ° °2) Contact Your Local Health Department °Not all health departments have doctors that can see patients for sick visits, but many do, so it is worth a call to see if yours does. If you don't know where your local health department is, you can check in your phone book. The CDC also has a tool to help you locate your state's health department, and many state websites also have listings of all of their local health departments. ° °3) Find a Walk-in Clinic °If your illness is not likely to be very severe or complicated, you may want to try a walk in clinic. These are popping up all over the country in pharmacies, drugstores, and shopping centers. They're usually staffed by nurse practitioners or physician assistants that have been trained to treat common illnesses and complaints. They're usually fairly quick and inexpensive. However, if you have serious medical issues or chronic medical problems, these are probably not your best option. ° °No Primary Care Doctor: °- Call [facility] at  832-8000 - they can help you locate a primary care doctor that  accepts your insurance, provides certain services, etc. °- Physician Referral Service- 1-800-533-3463 ° °Chronic Pain Problems: °Organization         Address  Phone   Notes  °[facility]  (336) 297-2271 Patients need to be referred by their primary care doctor.  ° °Medication  Assistance: °Organization         Address  Phone   Notes  °[place] [facility] [address] °[place], [place] [address] (336) 641-8030 --Must be a resident of [place] °-- Must have NO insurance coverage whatsoever (no Medicaid/ Medicare, etc.) °-- The pt. MUST have a primary care doctor that directs their care regularly and follows them in the community °  °[facility]  (866) 331-1348   °[employer]  (888) 892-1162   ° °Agencies that provide inexpensive medical care: °Organization         Address  Phone   Notes  °[facility]  (336) 832-8035   °[facility]    (336) 832-7272   °[facility] [address] °[place], [place] [address] (336) 832-4777   °[facility] [address], °[place] (336) 271-4999   °[facility]    (336) 373-0678   °[facility]    (336) 272-1050   °[facility] ° [address], [place] Phone:  (336) 832-4444, Fax:  (336) 832-4440 Hours of Operation:  9 am - 6 pm, M-F.  Also accepts Medicaid/Medicare and self-pay.  °[facility] ° [address], [place] Phone: (336) 832-3150, Fax: (336) 832-3151. Hours of Operation:  8:30 am - 5:30 pm, M-F.  Also accepts Medicaid and self-pay.  °[facility] [place] [address], [place] Phone: (336) 878-6027   °[facility] [address], [place], [place] (336)723-1848, Ext. 123 Mondays & Thursdays: 7-9 AM.  First 15 patients are seen on a first come, first serve basis. °  ° °Medicaid-accepting [place] Providers: ° °Organization         Address  Phone   Notes  °[facility] [address], [place] (336) 641-2100 Also accepts self-pay patients.  °[facility] [address], [place] ° (336) 856-9996   °[facility] [address], [place]  (336) 288-8857   °[facility] [address] [place] [address], [place] (336) 299-7000   °Veita Bland [address], [place]  ° (336) 373-1557 Only accepts [place] Access Medicaid patients after they have their name applied to their card.  ° °Self-Pay (no insurance) in [place]: ° °Organization         Address  Phone   Notes  °Sickle Cell Patients, [facility] [address], [place] (336) 832-1970   °[facility] [address], [place] (336) 832-4400   °[facility] ° [address], [place] (336) 992-4800   °[facility]/Dr. Osei-Bonsu ° [address] [place] [address], [place] or [address], [place] (336) 841-8500 Phone number for both [place] and [place] locations is the same.  °[facility] [address], [place] (336) 299-0000   °[facility] [address] [place] [address], [place] or [address] (336) 852-7530 °(336) 878-2260   °[facility] [address], [place] (336) 350-1642, phone; (336) 294-5005, fax Sees patients 1st and 3rd Saturday of every month.  Must not qualify for public or private insurance (i.e. Medicaid, Medicare, [place] Health Choice, Veterans' Benefits) • Household income should be no more than 200% of the poverty level •The clinic cannot treat you if you are pregnant or think you are pregnant • Sexually transmitted diseases are not treated at the clinic.  ° ° °Dental Care: °Organization         Address  Phone  Notes  °[place] [facility] [address], [place] (336) 641-6152 Accepts children up to age 21 who are enrolled in Medicaid or [place] Health Choice; pregnant women with a Medicaid card; and children who have applied for Medicaid or [place] Health Choice, but were declined, whose parents can pay a reduced fee at time of service.  °[place]  [facility] [place]  [address], [place] (336) 641-7733 Accepts children up to age 21 who are enrolled in Medicaid or [place] Health Choice; pregnant women with a Medicaid card; and children who have applied for Medicaid or [place] Health Choice, but were declined, whose parents can pay a reduced fee at time of service.  °[facility] ° [address], [place] (336) 641-4533 Patients are seen by appointment only. Walk-ins are not accepted. [facility] will see patients 18 years of age and older. °Monday - Tuesday (8am-5pm) °Most Wednesdays (8:30-5pm) °$30 per visit, cash only  °[facility] ° [address], [place] (336) 641-4533 Patients are seen by appointment only. Walk-ins are not accepted. [facility] will see patients 18 years of age and older. °One   Wednesday Evening (Monthly: Volunteer Based).  $30 per visit, cash only  °[facility]  (919) 537-3737 for adults; Children under age 4, call Graduate Pediatric Dentistry at ([phone]) [phone]. Children aged 4-14, please call (919) [phone] to request a pediatric application. ° Dental services are provided in all areas of dental care including fillings, crowns and bridges, complete and partial dentures, implants, gum treatment, root canals, and extractions. Preventive care is also provided. Treatment is provided to both adults and children. °Patients are selected via a lottery and there is often a waiting list. °  °[facility] [address], °[place] ° (336) 763-8833 [email or web address] °  °[facility] [address], [place], [place] (336)723-1848, Ext. 123 Second and Fourth Thursday of each month, opens at 6:30 AM; Clinic ends at 9 AM.  Patients are seen on a first-come first-served basis, and a limited number are seen during each clinic.  ° °[facility] ° [address], [place], [place] (336) 723-7904    Eligibility Requirements °You must have lived in [place], [place], or [place] counties for at least the last three months. °  You cannot be eligible for state or federal sponsored healthcare insurance, including Veterans Administration, Medicaid, or Medicare. °  You generally cannot be eligible for healthcare insurance through your employer.  °  How to apply: °Eligibility screenings are held every Tuesday and Wednesday afternoon from 1:00 pm until 4:00 pm. You do not need an appointment for the interview!  °[facility] [address], [place], [place] 336-631-2330   °[facility]  336-342-8273   °[facility]  336-703-3100   °[facility]  336-570-6415   ° °Behavioral Health Resources in the Community: °Intensive Outpatient Programs °Organization         Address  Phone  Notes  °[facility] [address], [place], [place] 336-878-6098   °[facility] [address], [place], [place] 336-832-9800   °[facility] [address], [place], [place] ° 336-882-2125   °[facility] [address],  °[place], [place] 1-800-853-5163 or 336-641-4981   °Substance Abuse Resources °Organization         Address  Phone  Notes  °[facility]  336-882-2125   °[facility]  336-784-9470   °[facility]  336-285-9073   °[facility]  336-845-3988   °Residential & Outpatient Substance Abuse Program  1-800-659-3381   °Psychological Services °Organization         Address  Phone  Notes  °[facility]  336- 832-9600   °[facility]  336- 378-7881   °[facility] [address], [place] 1-800-853-5163 or 336-641-4981   ° °Mobile Crisis Teams °Organization         Address  Phone  Notes  °[facility], Mobile Crisis Care Unit  1-877-626-1772   °Assertive °[facility] ° [address]  [place], [place] 336-834-9664   °Sharon DeEsch [address] °[place] 336-554-5454   ° °Self-Help/Support Groups °Organization         Address  Phone             Notes  °[facility]  - variety of support groups  336- 373-1402 Call for more information  °Narcotics Anonymous (NA), [facility] [address], °[place]  2 meetings at this location  ° °  Residential Treatment Programs Organization         Address  Phone  Notes  [facility] [address]    [place]  1-610-960-4540   [facility]  [address] [place] [address], [place], [place] 478-295-6213   [facility] [address] [place], [place] 086-578-4696 Admissions: 8am-3pm M-F  [facility] [address]    [place], [place] 295-284-1324   [facility] [address] [place], [place], [place] 401-027-2536   [facility] [address]  [place], [place] 644-034-7425   [facility] - Intensive Outpatient [address], [place], [place] 956-387-5643   [facility] ([facility] [address] [place].,  [place], [place] 3-295-188-4166 or 445-140-9160   [facility] ([facility] [address] [place], [place] 323-557-3220 Accepts Medicaid  [facility] [address]  [place] 2-542-706-2376 Substance Abuse/Addiction Treatment   [facility]         Address  Phone  Notes  [facility]  318-662-4592   Angie Fava, PhD [address] [place] [place], [place]   682-825-9072 or 682-147-6969   [facility]   [address] [place], [place] 641-635-3299   [facility] [address] [place], [place] 364-402-6605 Insurance/Medicaid/sponsorship through [facility] [address]                                    [place], [place] 402-720-8031 Therapy/tele-psych/case    [facility] [address], [place] [phone]    Dr. Lolly Mustache  219-359-2208   [facility]  [employer] [facility] Dept. 1) [address] [place] 2) [address], [place] 3)  [address], [place] (551)[phone]  330-428-4385   [facility] Child Abuse Hotline 731-200-8620 or 360-386-7204 (After Hours)       Take the prescriptions as directed.  Take over the counter benadryl, as directed on packaging, as needed for itching, rash, or swelling.  If the benadryl is too sedating, take an over the counter non-sedating antihistamine such as claritin, allegra or zyrtec, as directed on packaging.  Call your regular medical doctor and your Dentist tomorrow to schedule a follow up appointment this week.  Return to the Emergency Department immediately sooner if worsening.

## 2014-05-11 NOTE — ED Notes (Signed)
Wisdom tooth pulled 2 weeks ago, been on antibiotics, right jaw pain, pt face is swollen, Work called mother felt like pt is having a reaction to antibiotic, pt is mumbling, difficult to talk due to pain and swelling in face and tongue swelling.

## 2014-07-28 ENCOUNTER — Other Ambulatory Visit: Payer: Self-pay | Admitting: Obstetrics and Gynecology

## 2014-07-28 DIAGNOSIS — O3680X0 Pregnancy with inconclusive fetal viability, not applicable or unspecified: Secondary | ICD-10-CM

## 2014-07-29 ENCOUNTER — Other Ambulatory Visit: Payer: Self-pay | Admitting: Obstetrics and Gynecology

## 2014-07-29 ENCOUNTER — Ambulatory Visit (INDEPENDENT_AMBULATORY_CARE_PROVIDER_SITE_OTHER): Payer: Medicaid Other

## 2014-07-29 ENCOUNTER — Encounter: Payer: Self-pay | Admitting: Obstetrics and Gynecology

## 2014-07-29 DIAGNOSIS — O3680X0 Pregnancy with inconclusive fetal viability, not applicable or unspecified: Secondary | ICD-10-CM

## 2014-07-29 NOTE — Progress Notes (Signed)
U/S(7+2wks)-single IUP with +FCA noted, FHR-159 bpm, cx appears closed, Rt ovary with 3.0cm Corpus Luteum noted, no free fluid noted within the pelvis, CRL c/w LMP dates

## 2014-08-05 ENCOUNTER — Encounter: Payer: Self-pay | Admitting: Advanced Practice Midwife

## 2014-08-05 ENCOUNTER — Other Ambulatory Visit (HOSPITAL_COMMUNITY)
Admission: RE | Admit: 2014-08-05 | Discharge: 2014-08-05 | Disposition: A | Discharge status: Home / Self Care | Payer: Medicaid Other | Source: Ambulatory Visit | Attending: Advanced Practice Midwife | Admitting: Advanced Practice Midwife

## 2014-08-05 ENCOUNTER — Ambulatory Visit (INDEPENDENT_AMBULATORY_CARE_PROVIDER_SITE_OTHER): Payer: Medicaid Other | Admitting: Advanced Practice Midwife

## 2014-08-05 VITALS — BP 120/60 | Wt 275.0 lb

## 2014-08-05 DIAGNOSIS — Z3401 Encounter for supervision of normal first pregnancy, first trimester: Secondary | ICD-10-CM

## 2014-08-05 DIAGNOSIS — Z0283 Encounter for blood-alcohol and blood-drug test: Secondary | ICD-10-CM

## 2014-08-05 DIAGNOSIS — Z113 Encounter for screening for infections with a predominantly sexual mode of transmission: Secondary | ICD-10-CM

## 2014-08-05 DIAGNOSIS — Z331 Pregnant state, incidental: Secondary | ICD-10-CM

## 2014-08-05 DIAGNOSIS — Z131 Encounter for screening for diabetes mellitus: Secondary | ICD-10-CM

## 2014-08-05 DIAGNOSIS — Z118 Encounter for screening for other infectious and parasitic diseases: Secondary | ICD-10-CM

## 2014-08-05 DIAGNOSIS — Z3400 Encounter for supervision of normal first pregnancy, unspecified trimester: Secondary | ICD-10-CM | POA: Insufficient documentation

## 2014-08-05 DIAGNOSIS — Z01419 Encounter for gynecological examination (general) (routine) without abnormal findings: Secondary | ICD-10-CM | POA: Diagnosis present

## 2014-08-05 DIAGNOSIS — Z1159 Encounter for screening for other viral diseases: Secondary | ICD-10-CM

## 2014-08-05 DIAGNOSIS — Z1389 Encounter for screening for other disorder: Secondary | ICD-10-CM

## 2014-08-05 DIAGNOSIS — Z3682 Encounter for antenatal screening for nuchal translucency: Secondary | ICD-10-CM

## 2014-08-05 DIAGNOSIS — Z1371 Encounter for nonprocreative screening for genetic disease carrier status: Secondary | ICD-10-CM

## 2014-08-05 DIAGNOSIS — Z114 Encounter for screening for human immunodeficiency virus [HIV]: Secondary | ICD-10-CM

## 2014-08-05 DIAGNOSIS — Z0184 Encounter for antibody response examination: Secondary | ICD-10-CM

## 2014-08-05 LAB — POCT URINALYSIS DIPSTICK
Blood, UA: NEGATIVE
Glucose, UA: NEGATIVE
KETONES UA: NEGATIVE
LEUKOCYTES UA: NEGATIVE
NITRITE UA: NEGATIVE
PROTEIN UA: NEGATIVE

## 2014-08-05 LAB — CBC
HEMATOCRIT: 38.1 % (ref 36.0–46.0)
Hemoglobin: 12.6 g/dL (ref 12.0–15.0)
MCH: 28.9 pg (ref 26.0–34.0)
MCHC: 33.1 g/dL (ref 30.0–36.0)
MCV: 87.4 fL (ref 78.0–100.0)
MPV: 9.5 fL (ref 9.4–12.4)
Platelets: 376 10*3/uL (ref 150–400)
RBC: 4.36 MIL/uL (ref 3.87–5.11)
RDW: 13.3 % (ref 11.5–15.5)
WBC: 12.8 10*3/uL — ABNORMAL HIGH (ref 4.0–10.5)

## 2014-08-05 MED ORDER — ANTIPYRINE-BENZOCAINE 5.4-1.4 % OT SOLN: 3.0000 [drp] | OTIC | Status: DC | PRN

## 2014-08-05 NOTE — Progress Notes (Signed)
  Subjective:    Dorothy Flowers is a G1P0 161w2d being seen today for her first obstetrical visit.  Her obstetrical history is significant for nothing.  Pregnancy history fully reviewed. She does not have a boyfriend and is not gay.  Was a virgin, but wanted to have a baby, so had intercourse with her "best friend" so she could get pregnant.  He plans on helping raise the baby.  Pt's mom also plans on helping raise  The baby  Patient reports cold sx with ear pain for 1 day.  Filed Vitals:   08/05/14 0916  BP: 120/60  Weight: 275 lb (124.739 kg)    HISTORY: OB History  Gravida Para Term Preterm AB SAB TAB Ectopic Multiple Living  1             # Outcome Date GA Lbr Len/2nd Weight Sex Delivery Anes PTL Lv  1 Current              History reviewed. No pertinent past medical history. Past Surgical History  Procedure Laterality Date  . Wisdom tooth extraction     Family History  Problem Relation Age of Onset  . Hypertension Mother   . Diabetes Maternal Aunt   . Diabetes Maternal Grandmother      Exam       Pelvic Exam:    Perineum: Normal Perineum   Vulva: normal   Vagina:  normal mucosa, normal discharge, no palpable nodules   Uterus Normal, Gravid, FH: ~8     Cervix: Normal.  1st pap smear ever collected   Adnexa: Not palpable   Urinary:  urethral meatus normal    System: Breast:  normal appearance, no masses or tenderness   Skin: normal coloration and turgor, no rashes    Neurologic: oriented, normal, normal mood   Extremities: normal strength, tone, and muscle mass   HEENT PERRLA.  Right ear has some fluid, no erythema   Mouth/Teeth mucous membranes moist, poor dentition   Neck supple and no masses   Cardiovascular: regular rate and rhythm   Respiratory:  appears well, vitals normal, no respiratory distress, acyanotic   Abdomen: soft, non-tender;  FHR: 159us          Assessment:    Pregnancy: G1P0 Patient Active Problem List   Diagnosis Date Noted   . Supervision of normal first pregnancy 08/05/2014  . OBESITY, NOS 10/12/2006  . ASTHMA, UNSPECIFIED 10/12/2006  . ACNE 10/12/2006        Plan:     Initial labs drawn. Continue prenatal vitamins  Auralgan for ear pain Salt water gargle/saline spray Problem list reviewed and updated  Reviewed n/v relief measures and warning s/s to report  Reviewed recommended weight gain based on pre-gravid BMI  Encouraged well-balanced diet Genetic Screening discussed Integrated Screen: requested.  Ultrasound discussed; fetal survey: requested.  Follow up in 4 weeks for NT/IT.  CRESENZO-DISHMAN,Jemia Fata 08/05/2014

## 2014-08-05 NOTE — Patient Instructions (Signed)
How a Baby Grows During Pregnancy Pregnancy begins when the female's sperm enters the female's egg. This happens in the fallopian tube and is called fertilization. The fertilized egg is called an embryo until it reaches 9 weeks from the time of fertilization. From 9 weeks until birth it is called a fetus. The fertilized egg moves down the tube into the uterus and attaches to the inside lining of the uterus.  The pregnant woman is responsible for the growth of the embryo/fetus by supplying nourishment and oxygen through the blood stream and placenta to the developing fetus. The uterus becomes larger and pops out from the abdomen more and more as the fetus develops and grows. A normal pregnancy lasts 280 days, with a range of 259 to 294 days, or 40 weeks. The pregnancy is divided up into three trimesters:  First trimester - 0 to 13 weeks.  Second trimester - 14 to 27 weeks.  Third trimester - 28 to 40 weeks. The day your baby is supposed to be born is called estimated date of confinement [facility]) or estimated date of delivery (EDD). GROWTH OF THE BABY MONTH BY MONTH 1. First Month: The fertilized egg attaches to the inside of the uterus and certain cells will form the placenta and others will develop into the fetus. The arms, legs, brain, spinal cord, lungs, and heart begin to develop. At the end of the first month the heart begins to beat. The embryo weighs less than an ounce and is  inch long. 2. Second Month: The bones can be seen, the inner ear, eye lids, hands and feet form and genitals develop. By the end of 8 weeks, all of the major organs are developing. The fetus now weighs less than an ounce and is one inch (2.54 cm) long. 3. Third Month: Teeth buds appear, all the internal organs are forming, bones and muscles begin to grow, the spine can flex and the skin is transparent. Finger and toe nails begin to form, the hands develop faster than the feet and the arms are longer than the legs at this point.  The fetus weighs a little more than an ounce (0.03 kg) and is 3 inches (8.89cm) long. 4. Fourth Month: The placenta is completely formed. The external sex organs, neck, outer ear, eyebrows, eyelids and fingernails are formed. The fetus can hear, swallow, flex its arms and legs and the kidney begins to produce urine. The skin is covered with a white waxy coating (vernix) and very thin hair (lanugo) is present. The fetus weighs 5 ounces (0.14kg) and is 6 to 7 inches (16.51cm) long. 5. Fifth Month: The fetus moves around more and can be felt for the first time (called quickening), sleeps and wakes up at times, may begin to suck its finger and the nails grow to the end of the fingers. The gallbladder is now functioning and helps to digest the nutrients, eggs are formed in the female and the testicles begin to drop down from the abdomen to the scrotum in the female. The fetus weighs  to 1 pound (0.45kg) and is 10 inches (25.4cm) long. 6. Sixth Month: The lungs are formed but the fetus does not breath yet. The eyes open, the brain develops more quickly at this time, one can detect finger and toe prints and thicker hair grows. The fetus weighs 1 to 1 pounds (0.68kg) and is 12 inches (30.48cm) long. 7. Seventh Month: The fetus can hear and respond to sounds, kicks and stretches and can sense  changes in light. The fetus weighs 2 to 2 pounds (1.13kg) and is 14 inches (35.56cm) long. 8. Eight Month: All organs and body systems are fully developed and functioning. The bones get harder, taste buds develop and can taste sweet and sour flavors and the fetus may hiccup now. Different parts of the brain are developing and the skull remains soft for the brain to grow. The fetus weighs 5 pounds (2.27kg) and is 18 inches (45.75cm) long. 9. Ninth Month: The fetus gains about a half a pound a week, the lungs are fully developed, patterns of sleep develop and the head moves down into the bottom of the uterus called vertex. If the  buttocks moves into the bottom of the uterus, it is called a breech. The fetus weighs 6 to 9 pounds (2.72 to 4.08kg) and is 20 inches (50.8cm) long. You should be informed about your pregnancy, yourself and how the baby is developing as much as possible. Being informed helps you to enjoy this experience. It also gives you the sense to feel if something is not going right and when to ask questions. Talk to your caregiver when you have questions about your baby or your own body. Document Released: 01/18/2008 Document Revised: 10/24/2011 Document Reviewed: 01/18/2008 [facility] Patient Information 2015 [place], [place]. This information is not intended to replace advice given to you by your health care provider. Make sure you discuss any questions you have with your health care provider. First Trimester of Pregnancy The first trimester of pregnancy is from week 1 until the end of week 12 (months 1 through 3). A week after a sperm fertilizes an egg, the egg will implant on the wall of the uterus. This embryo will begin to develop into a baby. Genes from you and your partner are forming the baby. The female genes determine whether the baby is a boy or a girl. At 6-8 weeks, the eyes and face are formed, and the heartbeat can be seen on ultrasound. At the end of 12 weeks, all the baby's organs are formed.  Now that you are pregnant, you will want to do everything you can to have a healthy baby. Two of the most important things are to get good prenatal care and to follow your health care provider's instructions. Prenatal care is all the medical care you receive before the baby's birth. This care will help prevent, find, and treat any problems during the pregnancy and childbirth. BODY CHANGES Your body goes through many changes during pregnancy. The changes vary from woman to woman.   You may gain or lose a couple of pounds at first.  You may feel sick to your stomach (nauseous) and throw up (vomit). If the vomiting  is uncontrollable, call your health care provider.  You may tire easily.  You may develop headaches that can be relieved by medicines approved by your health care provider.  You may urinate more often. Painful urination may mean you have a bladder infection.  You may develop heartburn as a result of your pregnancy.  You may develop constipation because certain hormones are causing the muscles that push waste through your intestines to slow down.  You may develop hemorrhoids or swollen, bulging veins (varicose veins).  Your breasts may begin to grow larger and become tender. Your nipples may stick out more, and the tissue that surrounds them (areola) may become darker.  Your gums may bleed and may be sensitive to brushing and flossing.  Dark spots or blotches (chloasma, mask  of pregnancy) may develop on your face. This will likely fade after the baby is born.  Your menstrual periods will stop.  You may have a loss of appetite.  You may develop cravings for certain kinds of food.  You may have changes in your emotions from day to day, such as being excited to be pregnant or being concerned that something may go wrong with the pregnancy and baby.  You may have more vivid and strange dreams.  You may have changes in your hair. These can include thickening of your hair, rapid growth, and changes in texture. Some women also have hair loss during or after pregnancy, or hair that feels dry or thin. Your hair will most likely return to normal after your baby is born. WHAT TO EXPECT AT YOUR PRENATAL VISITS During a routine prenatal visit: 10. You will be weighed to make sure you and the baby are growing normally. 11. Your blood pressure will be taken. 12. Your abdomen will be measured to track your baby's growth. 13. The fetal heartbeat will be listened to starting around week 10 or 12 of your pregnancy. 14. Test results from any previous visits will be discussed. Your health care  provider may ask you:  How you are feeling.  If you are feeling the baby move.  If you have had any abnormal symptoms, such as leaking fluid, bleeding, severe headaches, or abdominal cramping.  If you have any questions. Other tests that may be performed during your first trimester include:  Blood tests to find your blood type and to check for the presence of any previous infections. They will also be used to check for low iron levels (anemia) and Rh antibodies. Later in the pregnancy, blood tests for diabetes will be done along with other tests if problems develop.  Urine tests to check for infections, diabetes, or protein in the urine.  An ultrasound to confirm the proper growth and development of the baby.  An amniocentesis to check for possible genetic problems.  Fetal screens for spina bifida and Down syndrome.  You may need other tests to make sure you and the baby are doing well. HOME CARE INSTRUCTIONS  Medicines  Follow your health care provider's instructions regarding medicine use. Specific medicines may be either safe or unsafe to take during pregnancy.  Take your prenatal vitamins as directed.  If you develop constipation, try taking a stool softener if your health care provider approves. Diet  Eat regular, well-balanced meals. Choose a variety of foods, such as meat or vegetable-based protein, fish, milk and low-fat dairy products, vegetables, fruits, and whole grain breads and cereals. Your health care provider will help you determine the amount of weight gain that is right for you.  Avoid raw meat and uncooked cheese. These carry germs that can cause birth defects in the baby.  Eating four or five small meals rather than three large meals a day may help relieve nausea and vomiting. If you start to feel nauseous, eating a few soda crackers can be helpful. Drinking liquids between meals instead of during meals also seems to help nausea and vomiting.  If you develop  constipation, eat more high-fiber foods, such as fresh vegetables or fruit and whole grains. Drink enough fluids to keep your urine clear or pale yellow. Activity and Exercise  Exercise only as directed by your health care provider. Exercising will help you:  Control your weight.  Stay in shape.  Be prepared for labor and delivery.  Experiencing pain or cramping in the lower abdomen or low back is a good sign that you should stop exercising. Check with your health care provider before continuing normal exercises.  Try to avoid standing for long periods of time. Move your legs often if you must stand in one place for a long time.  Avoid heavy lifting.  Wear low-heeled shoes, and practice good posture.  You may continue to have sex unless your health care provider directs you otherwise. Relief of Pain or Discomfort  Wear a good support bra for breast tenderness.   Take warm sitz baths to soothe any pain or discomfort caused by hemorrhoids. Use hemorrhoid cream if your health care provider approves.   Rest with your legs elevated if you have leg cramps or low back pain.  If you develop varicose veins in your legs, wear support hose. Elevate your feet for 15 minutes, 3-4 times a day. Limit salt in your diet. Prenatal Care  Schedule your prenatal visits by the twelfth week of pregnancy. They are usually scheduled monthly at first, then more often in the last 2 months before delivery.  Write down your questions. Take them to your prenatal visits.  Keep all your prenatal visits as directed by your health care provider. Safety  Wear your seat belt at all times when driving.  Make a list of emergency phone numbers, including numbers for family, friends, the hospital, and police and fire departments. General Tips  Ask your health care provider for a referral to a local prenatal education class. Begin classes no later than at the beginning of month 6 of your pregnancy.  Ask for  help if you have counseling or nutritional needs during pregnancy. Your health care provider can offer advice or refer you to specialists for help with various needs.  Do not use hot tubs, steam rooms, or saunas.  Do not douche or use tampons or scented sanitary pads.  Do not cross your legs for long periods of time.  Avoid cat litter boxes and soil used by cats. These carry germs that can cause birth defects in the baby and possibly loss of the fetus by miscarriage or stillbirth.  Avoid all smoking, herbs, alcohol, and medicines not prescribed by your health care provider. Chemicals in these affect the formation and growth of the baby.  Schedule a dentist appointment. At home, brush your teeth with a soft toothbrush and be gentle when you floss. SEEK MEDICAL CARE IF:   You have dizziness.  You have mild pelvic cramps, pelvic pressure, or nagging pain in the abdominal area.  You have persistent nausea, vomiting, or diarrhea.  You have a bad smelling vaginal discharge.  You have pain with urination.  You notice increased swelling in your face, hands, legs, or ankles. SEEK IMMEDIATE MEDICAL CARE IF:   You have a fever.  You are leaking fluid from your vagina.  You have spotting or bleeding from your vagina.  You have severe abdominal cramping or pain.  You have rapid weight gain or loss.  You vomit blood or material that looks like coffee grounds.  You are exposed to [place] measles and have never had them.  You are exposed to fifth disease or chickenpox.  You develop a severe headache.  You have shortness of breath.  You have any kind of trauma, such as from a fall or a car accident. Document Released: 07/26/2001 Document Revised: 12/16/2013 Document Reviewed: 06/11/2013 [facility] Patient Information 2015 [place], [place]. This information is not  intended to replace advice given to you by your health care provider. Make sure you discuss any questions you have with your  health care provider.

## 2014-08-05 NOTE — Progress Notes (Signed)
Pt here today for New OB visit. Pt given [facility] form and lab consent to read over and sign. PT states that she has a cold and thinks she may have an ear infection as well.

## 2014-08-06 ENCOUNTER — Other Ambulatory Visit: Payer: Medicaid Other

## 2014-08-06 LAB — DRUG SCREEN, URINE, NO CONFIRMATION
AMPHETAMINE SCRN UR: NEGATIVE
BENZODIAZEPINES.: NEGATIVE
Barbiturate Quant, Ur: NEGATIVE
CREATININE, U: 50.5 mg/dL
Cocaine Metabolites: NEGATIVE
METHADONE: NEGATIVE
Marijuana Metabolite: NEGATIVE
Opiate Screen, Urine: NEGATIVE
PROPOXYPHENE: NEGATIVE
Phencyclidine (PCP): NEGATIVE

## 2014-08-06 LAB — VARICELLA ZOSTER ANTIBODY, IGG: VARICELLA IGG: 96.52 {index} (ref <135.00)

## 2014-08-06 LAB — URINALYSIS
Bilirubin Urine: NEGATIVE
Glucose, UA: NEGATIVE mg/dL
HGB URINE DIPSTICK: NEGATIVE
KETONES UR: NEGATIVE mg/dL
NITRITE: NEGATIVE
PH: 7.5 (ref 5.0–8.0)
Protein, ur: NEGATIVE mg/dL
Specific Gravity, Urine: 1.009 (ref 1.005–1.030)
Urobilinogen, UA: 0.2 mg/dL (ref 0.0–1.0)

## 2014-08-06 LAB — ANTIBODY SCREEN: ANTIBODY SCREEN: NEGATIVE

## 2014-08-06 LAB — HEMOGLOBIN A1C
HEMOGLOBIN A1C: 5.6 % (ref <5.7)
Mean Plasma Glucose: 114 mg/dL (ref <117)

## 2014-08-06 LAB — RPR

## 2014-08-06 LAB — HIV ANTIBODY (ROUTINE TESTING W REFLEX): HIV: NONREACTIVE

## 2014-08-06 LAB — ABO AND RH: RH TYPE: POSITIVE

## 2014-08-06 LAB — CYSTIC FIBROSIS DIAGNOSTIC STUDY

## 2014-08-06 LAB — OXYCODONE SCREEN, UA, RFLX CONFIRM: OXYCODONE SCRN UR: NEGATIVE ng/mL

## 2014-08-06 LAB — CYTOLOGY - PAP

## 2014-08-06 LAB — HEPATITIS B SURFACE ANTIGEN: Hepatitis B Surface Ag: NEGATIVE

## 2014-08-06 LAB — RUBELLA SCREEN: RUBELLA: 0.34 {index} (ref <0.90)

## 2014-08-07 LAB — URINE CULTURE
Colony Count: NO GROWTH
Organism ID, Bacteria: NO GROWTH

## 2014-08-10 ENCOUNTER — Encounter (HOSPITAL_COMMUNITY): Payer: Self-pay | Admitting: Emergency Medicine

## 2014-08-10 ENCOUNTER — Emergency Department (HOSPITAL_COMMUNITY)
Admission: EM | Admit: 2014-08-10 | Discharge: 2014-08-10 | Disposition: A | Discharge status: Home / Self Care | Payer: Medicaid Other | Attending: Emergency Medicine | Admitting: Emergency Medicine

## 2014-08-10 DIAGNOSIS — B9789 Other viral agents as the cause of diseases classified elsewhere: Secondary | ICD-10-CM

## 2014-08-10 DIAGNOSIS — R05 Cough: Secondary | ICD-10-CM | POA: Diagnosis present

## 2014-08-10 DIAGNOSIS — Z87891 Personal history of nicotine dependence: Secondary | ICD-10-CM | POA: Diagnosis not present

## 2014-08-10 DIAGNOSIS — H109 Unspecified conjunctivitis: Secondary | ICD-10-CM | POA: Diagnosis not present

## 2014-08-10 DIAGNOSIS — J069 Acute upper respiratory infection, unspecified: Secondary | ICD-10-CM | POA: Insufficient documentation

## 2014-08-10 MED ORDER — ERYTHROMYCIN 5 MG/GM OP OINT
TOPICAL_OINTMENT | Freq: Four times a day (QID) | OPHTHALMIC | Status: DC
  Administered 2014-08-10: 1 via OPHTHALMIC
  Filled 2014-08-10: qty 3.5

## 2014-08-10 NOTE — ED Provider Notes (Signed)
CSN: [id number]     Arrival date & time 08/10/14  [id number] History  This chart was scribed for Joya Gaskinsonald W Ayeza Therriault, MD by Ronney LionSuzanne Le, ED Scribe. This patient was seen in room APA04/APA04 and the patient's care was started at 7:22 AM.    Chief Complaint  Patient presents with  . Eye Problem  . Cough   Patient is a 28 y.o. female presenting with eye problem and cough. The history is provided by the patient and a parent. No language interpreter was used.  Eye Problem Location:  L eye Quality:  Burning and aching Severity:  Moderate Onset quality:  Sudden Duration:  1 day Timing:  Constant Chronicity:  New Context: not contact lens problem, not direct trauma and not foreign body   Associated symptoms: crusting, discharge, itching and redness   Associated symptoms: no vomiting   Cough Cough characteristics:  Productive Sputum characteristics:  Green Severity:  Moderate Duration:  5 days Timing:  Constant Context: not sick contacts   Associated symptoms: eye discharge and sore throat   Associated symptoms: no fever and no shortness of breath     HPI Comments: Dorothy Flowers is a 28 y.o. female [redacted] weeks pregnant (G1P0A0) who presents to the Emergency Department complaining of constant productive cough, congestion, and sore throat that began 5 days ago, and burning, itching, sore, crusting, left eye drainage (green discharge) that began when she woke up yesterday morning. Patient denies any foreign object in eye, and denies use of contact lenses. Patient has had 2 US's done, with no abnormalities noted. She denies fever, vomiting, SOB, and visual disturbance. 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  PMH - pregnant Past Surgical History  Procedure Laterality Date  . Wisdom tooth extraction     Family History  Problem Relation Age of Onset  . Hypertension Mother   . Diabetes Maternal Aunt   . Diabetes Maternal Grandmother    History   Substance Use Topics  . Smoking status: Former Smoker -- 0.50 packs/day    Types: Cigarettes  . Smokeless tobacco: Never Used  . Alcohol Use: No   OB History    Gravida Para Term Preterm AB TAB SAB Ectopic Multiple Living   1              Review of Systems  Constitutional: Negative for fever.  HENT: Positive for congestion and sore throat.   Eyes: Positive for pain, discharge, redness and itching. Negative for visual disturbance.  Respiratory: Positive for cough. Negative for shortness of breath.   Gastrointestinal: Negative for vomiting.  Genitourinary: Negative for vaginal bleeding.  All other systems reviewed and are negative.     Allergies  Cephalosporins  Home Medications   Prior to Admission medications   Medication Sig Start Date End Date Taking? Authorizing Provider  antipyrine-benzocaine Lyla Son(AURALGAN) otic solution Place 3-4 drops into the right ear every 2 (two) hours as needed for ear pain. 08/05/14   Jacklyn ShellFrances Cresenzo-Dishmon, CNM  prenatal vitamin w/FE, FA (PRENATAL 1 + 1) 27-1 MG TABS tablet Take 1 tablet by mouth daily at 12 noon.    Historical Provider, MD   BP 113/79 mmHg  Pulse 99  Temp(Src) 98.6 F (37 C) (Oral)  Resp 16  Ht 5\' 4"  (1.626 m)  Wt 275 lb (124.739 kg)  BMI 47.18 kg/m2  SpO2 98%  LMP 06/08/2014 (Approximate) Physical Exam  Nursing note and vitals reviewed.  CONSTITUTIONAL: Well developed/well nourished  HEAD: Normocephalic/atraumatic EYES: EOMI/PERRL; conjunctival erythema and discharge noted to left eye, no foreign body or signs of trauma ENMT: Mucous membranes moist; bilateral TMs clear and intact; uvula midline; no erythema or exudate NECK: supple no meningeal signs SPINE/BACK:entire spine nontender CV: S1/S2 noted, no murmurs/rubs/gallops noted LUNGS: Lungs are clear to auscultation bilaterally, no apparent distress ABDOMEN: soft, nontender, no rebound or guarding, bowel sounds noted throughout abdomen NEURO: Pt is  awake/alert/appropriate, moves all extremitiesx4.  No facial droop.   EXTREMITIES: pulses normal/equal, full ROM SKIN: warm, color normal PSYCH: no abnormalities of mood noted, alert and oriented to situation  ED Course  Procedures  DIAGNOSTIC STUDIES: Oxygen Saturation is 98% on room air, normal by my interpretation.    COORDINATION OF CARE: 7:25 AM - Discussed treatment plan with pt at bedside which includes OTC cold medication and topical antibiotic for her eye and pt agreed to plan.   MDM   Final diagnoses:  Conjunctivitis of left eye  Viral URI with cough    I personally performed the services described in this documentation, which was scribed in my presence. The recorded information has been reviewed and is accurate.      Joya Gaskinsonald W Max Nuno, MD 08/10/14 [phone]

## 2014-08-10 NOTE — Discharge Instructions (Signed)

## 2014-08-10 NOTE — ED Notes (Signed)
Patient reports woke up yesterday morning with drainage and redness to left eye. Also reports cough, sore throat, nasal congestion since Tuesday. Patient reports [redacted] weeks pregnant.

## 2014-08-15 NOTE — L&D Delivery Note (Signed)
  Delivery Note Deep variables x 4 prior to delivery, but pt pushing very well and progressing rapidly.  At 11:24 AM a viable female was delivered via Vaginal, Spontaneous Delivery (Presentation: Left Occiput Anterior).  APGAR: 4, 4, 6; weight 6 lb 2.4 oz (2790 g). Infant floppy, but HR >100. Stimulated and bulb suctioned on mom's abd <1 minute. When not responding to stimulation cord was clamped x 2 and cut. Baby taken quickly to warmer. NICU called. See note.  Placenta status: Intact, Spontaneous, and sent for pathology. Cord: 3 vessels with the following complications: Nuchal x1 and True Knot.  Cord pH: 7.15  Anesthesia: Epidural  Episiotomy: None Lacerations: 2nd degree;Periurethral;bilat Sulcus Suture Repair: vicryl rapide Est. Blood Loss (mL): 601  Mom to postpartum.  Baby to NICU. Placenta to: pathology Feeding: Bottle Circ: IP Contraception: Abstinence  De HollingsheadCatherine L Wallace 02/26/2015, 1:44 PM

## 2014-08-16 ENCOUNTER — Encounter (HOSPITAL_COMMUNITY): Payer: Self-pay | Admitting: *Deleted

## 2014-08-16 ENCOUNTER — Inpatient Hospital Stay (HOSPITAL_COMMUNITY)
Admission: AD | Admit: 2014-08-16 | Discharge: 2014-08-16 | Disposition: A | Discharge status: Home / Self Care | Payer: Medicaid Other | Source: Ambulatory Visit | Attending: Obstetrics and Gynecology | Admitting: Obstetrics and Gynecology

## 2014-08-16 DIAGNOSIS — O99511 Diseases of the respiratory system complicating pregnancy, first trimester: Secondary | ICD-10-CM | POA: Insufficient documentation

## 2014-08-16 DIAGNOSIS — Z87891 Personal history of nicotine dependence: Secondary | ICD-10-CM | POA: Diagnosis not present

## 2014-08-16 DIAGNOSIS — O9989 Other specified diseases and conditions complicating pregnancy, childbirth and the puerperium: Secondary | ICD-10-CM

## 2014-08-16 DIAGNOSIS — Z3490 Encounter for supervision of normal pregnancy, unspecified, unspecified trimester: Secondary | ICD-10-CM

## 2014-08-16 DIAGNOSIS — J069 Acute upper respiratory infection, unspecified: Secondary | ICD-10-CM | POA: Insufficient documentation

## 2014-08-16 DIAGNOSIS — Z3A09 9 weeks gestation of pregnancy: Secondary | ICD-10-CM | POA: Insufficient documentation

## 2014-08-16 DIAGNOSIS — H9201 Otalgia, right ear: Secondary | ICD-10-CM | POA: Diagnosis present

## 2014-08-16 LAB — URINALYSIS, ROUTINE W REFLEX MICROSCOPIC
BILIRUBIN URINE: NEGATIVE
Glucose, UA: NEGATIVE mg/dL
HGB URINE DIPSTICK: NEGATIVE
KETONES UR: NEGATIVE mg/dL
Leukocytes, UA: NEGATIVE
Nitrite: NEGATIVE
PROTEIN: NEGATIVE mg/dL
Specific Gravity, Urine: 1.01 (ref 1.005–1.030)
UROBILINOGEN UA: 0.2 mg/dL (ref 0.0–1.0)
pH: 6 (ref 5.0–8.0)

## 2014-08-16 MED ORDER — ALBUTEROL SULFATE HFA 108 (90 BASE) MCG/ACT IN AERS: 1.0000 | INHALATION_SPRAY | Freq: Four times a day (QID) | RESPIRATORY_TRACT | Status: DC | PRN

## 2014-08-16 MED ORDER — AZITHROMYCIN 250 MG PO TABS: 250.0000 mg | ORAL_TABLET | Freq: Every day | ORAL | Status: DC

## 2014-08-16 MED ORDER — ACETAMINOPHEN-CODEINE #3 300-30 MG PO TABS: 1.0000 | ORAL_TABLET | Freq: Four times a day (QID) | ORAL | Status: DC | PRN

## 2014-08-16 NOTE — MAU Provider Note (Signed)
History     CSN: [id number]  Arrival date and time: 08/16/14 1478   First Provider Initiated Contact with Patient 08/16/14 1016      Chief Complaint  Patient presents with  . Rash  . Otalgia  . Morning Sickness   HPI Comments: Dorothy Flowers 29 y.o. G1P0 [redacted]w[redacted]d presents to MAU with cold sx that have been ongoing for 2 weeks and not getting better. She went to ED on 08/10/14 and was told to take OTC medications. She now has right ear ache, sore throat, cough. She has hx asthma and no inhaler. She is also complaining of rash on right side of face with no new products.   Rash Associated symptoms include coughing and a sore throat.  Otalgia  Associated symptoms include coughing, a rash and a sore throat.      History reviewed. No pertinent past medical history.  Past Surgical History  Procedure Laterality Date  . Wisdom tooth extraction      Family History  Problem Relation Age of Onset  . Hypertension Mother   . Diabetes Maternal Aunt   . Diabetes Maternal Grandmother     History  Substance Use Topics  . Smoking status: Former Smoker -- 0.00 packs/day  . Smokeless tobacco: Never Used  . Alcohol Use: No    Allergies:  Allergies  Allergen Reactions  . Cephalosporins Swelling and Other (See Comments)    Pain    Prescriptions prior to admission  Medication Sig Dispense Refill Last Dose  . acetaminophen (TYLENOL) 500 MG tablet Take 500 mg by mouth daily as needed for mild pain or moderate pain.   08/16/2014 at Unknown time  . erythromycin ophthalmic ointment Place 1 application into both eyes 2 (two) times daily.   08/15/2014 at Unknown time  . prenatal vitamin w/FE, FA (PRENATAL 1 + 1) 27-1 MG TABS tablet Take 1 tablet by mouth daily at 12 noon.   08/15/2014 at Unknown time  . antipyrine-benzocaine (AURALGAN) otic solution Place 3-4 drops into the right ear every 2 (two) hours as needed for ear pain. (Patient not taking: Reported on 08/16/2014) 10 mL 0     Review of  Systems  Constitutional: Positive for chills.  HENT: Positive for ear pain and sore throat.   Respiratory: Positive for cough. Negative for wheezing.   Gastrointestinal: Negative.   Genitourinary: Negative.   Musculoskeletal: Negative.   Skin: Positive for rash.  Neurological: Positive for weakness.  Psychiatric/Behavioral: Negative.    Physical Exam   Blood pressure 123/78, pulse 97, temperature 98.1 F (36.7 C), resp. rate 18, height  (1.626 m), weight 124.739 kg (275 lb), last menstrual period 06/08/2014.  Physical Exam  Constitutional: She is oriented to person, place, and time. She appears well-developed and well-nourished. No distress.  Obese  HENT:  Head: Normocephalic and atraumatic.  Right Ear: Tympanic membrane is injected.  Mouth/Throat: Posterior oropharyngeal erythema present. No oropharyngeal exudate or posterior oropharyngeal edema.  Cervical lymphadenopathy  Cardiovascular: Normal rate, regular rhythm and normal heart sounds.   Respiratory: Effort normal and breath sounds normal. No respiratory distress. She has no wheezes. She has no rales.  Musculoskeletal: Normal range of motion.  Neurological: She is alert and oriented to person, place, and time.  Skin: Skin is warm and dry.  Psychiatric: She has a normal mood and affect. Her behavior is normal. Judgment and thought content normal.    MAU Course  Procedures  MDM   Assessment and Plan   A:  URI Hx asthma  P: Z pack as directed Tynenol # 3 prn cough/ sore throat Proventil inhaler Increase fluids/ rest Note for work x 2 days per pt request  Carolynn Serve 08/16/2014, 10:25 AM

## 2014-08-16 NOTE — Discharge Instructions (Signed)
Upper Respiratory Infection, Adult °An upper respiratory infection (URI) is also sometimes known as the common cold. The upper respiratory tract includes the nose, sinuses, throat, trachea, and bronchi. Bronchi are the airways leading to the lungs. Most people improve within 1 week, but symptoms can last up to 2 weeks. A residual cough may last even longer.  °CAUSES °Many different viruses can infect the tissues lining the upper respiratory tract. The tissues become irritated and inflamed and often become very moist. Mucus production is also common. A cold is contagious. You can easily spread the virus to others by oral contact. This includes kissing, sharing a glass, coughing, or sneezing. Touching your mouth or nose and then touching a surface, which is then touched by another person, can also spread the virus. °SYMPTOMS  °Symptoms typically develop 1 to 3 days after you come in contact with a cold virus. Symptoms vary from person to person. They may include: °· Runny nose. °· Sneezing. °· Nasal congestion. °· Sinus irritation. °· Sore throat. °· Loss of voice (laryngitis). °· Cough. °· Fatigue. °· Muscle aches. °· Loss of appetite. °· Headache. °· Low-grade fever. °DIAGNOSIS  °You might diagnose your own cold based on familiar symptoms, since most people get a cold 2 to 3 times a year. Your caregiver can confirm this based on your exam. Most importantly, your caregiver can check that your symptoms are not due to another disease such as strep throat, sinusitis, pneumonia, asthma, or epiglottitis. Blood tests, throat tests, and X-rays are not necessary to diagnose a common cold, but they may sometimes be helpful in excluding other more serious diseases. Your caregiver will decide if any further tests are required. °RISKS AND COMPLICATIONS  °You may be at risk for a more severe case of the common cold if you smoke cigarettes, have chronic heart disease (such as heart failure) or lung disease (such as asthma), or if  you have a weakened immune system. The very young and very old are also at risk for more serious infections. Bacterial sinusitis, middle ear infections, and bacterial pneumonia can complicate the common cold. The common cold can worsen asthma and chronic obstructive pulmonary disease (COPD). Sometimes, these complications can require emergency medical care and may be life-threatening. °PREVENTION  °The best way to protect against getting a cold is to practice good hygiene. Avoid oral or hand contact with people with cold symptoms. Wash your hands often if contact occurs. There is no clear evidence that vitamin C, vitamin E, echinacea, or exercise reduces the chance of developing a cold. However, it is always recommended to get plenty of rest and practice good nutrition. °TREATMENT  °Treatment is directed at relieving symptoms. There is no cure. Antibiotics are not effective, because the infection is caused by a virus, not by bacteria. Treatment may include: °· Increased fluid intake. Sports drinks offer valuable electrolytes, sugars, and fluids. °· Breathing heated mist or steam (vaporizer or shower). °· Eating chicken soup or other clear broths, and maintaining good nutrition. °· Getting plenty of rest. °· Using gargles or lozenges for comfort. °· Controlling fevers with ibuprofen or acetaminophen as directed by your caregiver. °· Increasing usage of your inhaler if you have asthma. °Zinc gel and zinc lozenges, taken in the first 24 hours of the common cold, can shorten the duration and lessen the severity of symptoms. Pain medicines may help with fever, muscle aches, and throat pain. A variety of non-prescription medicines are available to treat congestion and runny nose. Your caregiver   can make recommendations and may suggest nasal or lung inhalers for other symptoms.  HOME CARE INSTRUCTIONS   Only take over-the-counter or prescription medicines for pain, discomfort, or fever as directed by your  caregiver.  Use a warm mist humidifier or inhale steam from a shower to increase air moisture. This may keep secretions moist and make it easier to breathe.  Drink enough water and fluids to keep your urine clear or pale yellow.  Rest as needed.  Return to work when your temperature has returned to normal or as your caregiver advises. You may need to stay home longer to avoid infecting others. You can also use a face mask and careful hand washing to prevent spread of the virus. SEEK MEDICAL CARE IF:   After the first few days, you feel you are getting worse rather than better.  You need your caregiver's advice about medicines to control symptoms.  You develop chills, worsening shortness of breath, or brown or red sputum. These may be signs of pneumonia.  You develop yellow or brown nasal discharge or pain in the face, especially when you bend forward. These may be signs of sinusitis.  You develop a fever, swollen neck glands, pain with swallowing, or white areas in the back of your throat. These may be signs of strep throat. SEEK IMMEDIATE MEDICAL CARE IF:   You have a fever.  You develop severe or persistent headache, ear pain, sinus pain, or chest pain.  You develop wheezing, a prolonged cough, cough up blood, or have a change in your usual mucus (if you have chronic lung disease).  You develop sore muscles or a stiff neck. Document Released: 01/25/2001 Document Revised: 10/24/2011 Document Reviewed: 11/06/2013 [facility] Patient Information 2015 [place], [place]. This information is not intended to replace advice given to you by your health care provider. Make sure you discuss any questions you have with your health care provider. Bronchospasm A bronchospasm is when the tubes that carry air in and out of your lungs (airways) spasm or tighten. During a bronchospasm it is hard to breathe. This is because the airways get smaller. A bronchospasm can be triggered by:  Allergies. These  may be to animals, pollen, food, or mold.  Infection. This is a common cause of bronchospasm.  Exercise.  Irritants. These include pollution, cigarette smoke, strong odors, aerosol sprays, and paint fumes.  Weather changes.  Stress.  Being emotional. HOME CARE   Always have a plan for getting help. Know when to call your doctor and local emergency services (911 in the [place] Know where you can get emergency care.  Only take medicines as told by your doctor.  If you were prescribed an inhaler or nebulizer machine, ask your doctor how to use it correctly. Always use a spacer with your inhaler if you were given one.  Stay calm during an attack. Try to relax and breathe more slowly.  Control your home environment:  Change your heating and air conditioning filter at least once a month.  Limit your use of fireplaces and wood stoves.  Do not  smoke. Do not  allow smoking in your home.  Avoid perfumes and fragrances.  Get rid of pests (such as roaches and mice) and their droppings.  Throw away plants if you see mold on them.  Keep your house clean and dust free.  Replace carpet with wood, tile, or vinyl flooring. Carpet can trap dander and dust.  Use allergy-proof pillows, mattress covers, and box spring covers.  Wash bed sheets and blankets every week in hot water. Dry them in a dryer.  Use blankets that are made of polyester or cotton.  Wash hands frequently. GET HELP IF:  You have muscle aches.  You have chest pain.  The thick spit you spit or cough up (sputum) changes from clear or white to yellow, green, gray, or bloody.  The thick spit you spit or cough up gets thicker.  There are problems that may be related to the medicine you are given such as:  A rash.  Itching.  Swelling.  Trouble breathing. GET HELP RIGHT AWAY IF:  You feel you cannot breathe or catch your breath.  You cannot stop coughing.  Your treatment is not helping you breathe  better.  You have very bad chest pain. MAKE SURE YOU:   Understand these instructions.  Will watch your condition.  Will get help right away if you are not doing well or get worse. Document Released: 05/29/2009 Document Revised: 08/06/2013 Document Reviewed: 01/22/2013 [facility] Patient Information 2015 [place], [place]. This information is not intended to replace advice given to you by your health care provider. Make sure you discuss any questions you have with your health care provider.

## 2014-08-16 NOTE — MAU Note (Signed)
Pt presents to MAU with complaints of an earache, N&V, and rash on the right side of her face.

## 2014-09-03 ENCOUNTER — Ambulatory Visit (INDEPENDENT_AMBULATORY_CARE_PROVIDER_SITE_OTHER): Payer: Medicaid Other | Admitting: Advanced Practice Midwife

## 2014-09-03 ENCOUNTER — Ambulatory Visit (INDEPENDENT_AMBULATORY_CARE_PROVIDER_SITE_OTHER): Payer: Medicaid Other

## 2014-09-03 VITALS — BP 108/72 | Wt 276.0 lb

## 2014-09-03 DIAGNOSIS — Z3682 Encounter for antenatal screening for nuchal translucency: Secondary | ICD-10-CM

## 2014-09-03 DIAGNOSIS — Z331 Pregnant state, incidental: Secondary | ICD-10-CM

## 2014-09-03 DIAGNOSIS — Z23 Encounter for immunization: Secondary | ICD-10-CM

## 2014-09-03 DIAGNOSIS — Z36 Encounter for antenatal screening of mother: Secondary | ICD-10-CM

## 2014-09-03 DIAGNOSIS — Z3401 Encounter for supervision of normal first pregnancy, first trimester: Secondary | ICD-10-CM

## 2014-09-03 DIAGNOSIS — Z1389 Encounter for screening for other disorder: Secondary | ICD-10-CM

## 2014-09-03 LAB — POCT URINALYSIS DIPSTICK
Blood, UA: NEGATIVE
GLUCOSE UA: NEGATIVE
KETONES UA: NEGATIVE
LEUKOCYTES UA: NEGATIVE
Nitrite, UA: NEGATIVE
PROTEIN UA: NEGATIVE

## 2014-09-03 MED ORDER — INFLUENZA VAC SPLIT QUAD 0.5 ML IM SUSP: 0.5000 mL | Freq: Once | INTRAMUSCULAR | Status: DC

## 2014-09-03 MED ORDER — INFLUENZA VAC SPLIT QUAD 0.5 ML IM SUSY
0.5000 mL | PREFILLED_SYRINGE | Freq: Once | INTRAMUSCULAR | Status: AC
  Administered 2014-09-03: 0.5 mL via INTRAMUSCULAR

## 2014-09-03 MED ORDER — INFLUENZA VAC SPLIT QUAD 0.5 ML IM SUSY: 0.5000 mL | PREFILLED_SYRINGE | Freq: Once | INTRAMUSCULAR | Status: DC

## 2014-09-03 MED ORDER — PRENATAL VITAMINS 0.8 MG PO TABS: 1.0000 | ORAL_TABLET | Freq: Every day | ORAL | Status: DC

## 2014-09-03 NOTE — Progress Notes (Signed)
U/S(12+3wks)-active fetus, FHR- 149 bpm, CRL c/w LMP dates, cx appears closed, bilateral adnexa appears WNL with C.L. Noted on the Rt-2.8cm, posterior Gr 0 placenta, NT-1.1957mm

## 2014-09-03 NOTE — Addendum Note (Signed)
Addended by: Criss AlvinePULLIAM, Penda Venturi G on: 09/03/2014 11:55 AM   Modules accepted: Orders, Medications

## 2014-09-03 NOTE — Progress Notes (Signed)
G1P0 5946w3d Estimated Date of Delivery: 03/15/15  Blood pressure 108/72, weight 276 lb (125.193 kg), last menstrual period 06/08/2014.   BP weight and urine results all reviewed and noted.  Please refer to the obstetrical flow sheet for the fundal height and fetal heart rate documentation:  Patient denies any bleeding and no rupture of membranes symptoms or regular contractions. Patient is without complaints. All questions were answered.  Plan:  Continued routine obstetrical care, NT/IT .Marland Kitchen. Flu shot today  Follow up in 4 weeks for OB appointment, 2nd IT

## 2014-09-03 NOTE — Addendum Note (Signed)
Addended by: Criss AlvinePULLIAM, CHRYSTAL G on: 09/03/2014 11:21 AM   Modules accepted: Orders

## 2014-09-03 NOTE — Addendum Note (Signed)
Addended by: Criss AlvinePULLIAM, Valrie Jia G on: 09/03/2014 11:31 AM   Modules accepted: Orders, Medications

## 2014-09-09 LAB — MATERNAL SCREEN, INTEGRATED #1

## 2014-09-11 ENCOUNTER — Telehealth: Payer: Self-pay | Admitting: *Deleted

## 2014-09-11 MED ORDER — AZITHROMYCIN 250 MG PO TABS: 250.0000 mg | ORAL_TABLET | Freq: Every day | ORAL | Status: DC

## 2014-09-11 NOTE — Telephone Encounter (Signed)
Cough/congestion since Sunday. Getting worse.  Zpack sent to pharmacy.  Has rx for APAP w/codeine

## 2014-09-11 NOTE — Telephone Encounter (Signed)
Pt Mom states pt got flu shot on 01/20, now having a cough with thick mucus, ears and sore throat, no fever. Pt to get wisdom tooth pulled today.

## 2014-10-01 ENCOUNTER — Encounter: Payer: Self-pay | Admitting: Women's Health

## 2014-10-01 ENCOUNTER — Ambulatory Visit (INDEPENDENT_AMBULATORY_CARE_PROVIDER_SITE_OTHER): Payer: Medicaid Other | Admitting: Women's Health

## 2014-10-01 VITALS — BP 122/64 | Wt 282.0 lb

## 2014-10-01 DIAGNOSIS — Z331 Pregnant state, incidental: Secondary | ICD-10-CM

## 2014-10-01 DIAGNOSIS — Z3402 Encounter for supervision of normal first pregnancy, second trimester: Secondary | ICD-10-CM

## 2014-10-01 DIAGNOSIS — Z3682 Encounter for antenatal screening for nuchal translucency: Secondary | ICD-10-CM

## 2014-10-01 DIAGNOSIS — Z1389 Encounter for screening for other disorder: Secondary | ICD-10-CM

## 2014-10-01 DIAGNOSIS — Z363 Encounter for antenatal screening for malformations: Secondary | ICD-10-CM

## 2014-10-01 NOTE — Progress Notes (Signed)
Low-risk OB appointment G1P0 3072w3d Estimated Date of Delivery: 03/15/15 BP 122/64 mmHg  Wt 282 lb (127.914 kg)  LMP 06/08/2014 (Approximate)  BP, weight, and urine reviewed.  Refer to obstetrical flow sheet for FH & FHR.  States she is feeling fm, and has been since 11wks. Denies cramping, lof, vb, or uti s/s. No complaints. Reviewed warning s/s to report. Plan:  Continue routine obstetrical care  F/U in 4wks for OB appointment and anatomy [facility]nd [facility]

## 2014-10-07 LAB — MATERNAL SCREEN, INTEGRATED #2
AFP MoM: 1.13
AFP, Serum: 27.6 ng/mL
Calculated Gestational Age: 16.7
Crown Rump Length: 64.4 mm
ESTRIOL FREE MAT SCREEN: 0.67 ng/mL
ESTRIOL MOM MAT SCREEN: 0.83
HCG, MOM MAT SCREEN: 0.88
INHIBIN A DIMERIC MAT SCREEN: 160 pg/mL
Inhibin A MoM: 1.26
MSS Down Syndrome: <1:5000 {titer}
MSS Trisomy 18 Risk: <1:5000 {titer}
NT MoM: 1.08
Nuchal Translucency: 1.57 mm
Number of fetuses: 1
PAPP-A MAT SCREEN: 390 ng/mL
PAPP-A MoM: 1.21
hCG, Serum: 19.1 IU/mL

## 2014-10-29 ENCOUNTER — Ambulatory Visit (INDEPENDENT_AMBULATORY_CARE_PROVIDER_SITE_OTHER): Payer: Medicaid Other | Admitting: Women's Health

## 2014-10-29 ENCOUNTER — Ambulatory Visit (INDEPENDENT_AMBULATORY_CARE_PROVIDER_SITE_OTHER): Payer: Medicaid Other

## 2014-10-29 ENCOUNTER — Encounter: Payer: Self-pay | Admitting: Women's Health

## 2014-10-29 ENCOUNTER — Other Ambulatory Visit: Payer: Self-pay | Admitting: Women's Health

## 2014-10-29 VITALS — BP 110/60 | HR 80 | Wt 278.0 lb

## 2014-10-29 DIAGNOSIS — Z1389 Encounter for screening for other disorder: Secondary | ICD-10-CM

## 2014-10-29 DIAGNOSIS — Z36 Encounter for antenatal screening of mother: Secondary | ICD-10-CM | POA: Diagnosis not present

## 2014-10-29 DIAGNOSIS — Z331 Pregnant state, incidental: Secondary | ICD-10-CM

## 2014-10-29 DIAGNOSIS — Z363 Encounter for antenatal screening for malformations: Secondary | ICD-10-CM

## 2014-10-29 DIAGNOSIS — Z0375 Encounter for suspected cervical shortening ruled out: Secondary | ICD-10-CM | POA: Diagnosis not present

## 2014-10-29 DIAGNOSIS — Z3402 Encounter for supervision of normal first pregnancy, second trimester: Secondary | ICD-10-CM

## 2014-10-29 LAB — POCT URINALYSIS DIPSTICK
GLUCOSE UA: NEGATIVE
GLUCOSE UA: NEGATIVE
Ketones, UA: NEGATIVE
LEUKOCYTES UA: NEGATIVE
NITRITE UA: NEGATIVE
Protein, UA: NEGATIVE
Protein, UA: NEGATIVE
RBC UA: NEGATIVE

## 2014-10-29 NOTE — Progress Notes (Signed)
Low-risk OB appointment G1P0 5960w3d Estimated Date of Delivery: 03/15/15 LMP 06/08/2014 (Approximate)  BP, weight, and urine reviewed.  Refer to obstetrical flow sheet for FH & FHR.  Reports good fm.  Denies regular uc's, lof, vb, or uti s/s. No complaints. Reviewed anatomy u/s- unable to view posterior fossa, all else normal- will recheck at 28wks. Discussed ptl s/s, fm. Plan:  Continue routine obstetrical care  F/U in 4wk for OB appointment

## 2014-10-29 NOTE — Progress Notes (Signed)
Anatomy US today at 20+[redacted] weeks GA.  Single, active female fetus in a cephalic presentation.  FHR 145 bpm.  Cervix is closed and measures 4.02 cm. Fluid is normal with a SVP of 5.31cm.  Posterior Gr 0 placenta.  EFW of 430 g which is consistent with dating. Posterior fossa and nuchal region not well visualized today.  All other anatomy appears normal on today's ultrasound. Study limited by maternal body habitus.

## 2014-10-29 NOTE — Patient Instructions (Signed)
Second Trimester of Pregnancy The second trimester is from week 13 through week 28, months 4 through 6. The second trimester is often a time when you feel your best. Your body has also adjusted to being pregnant, and you begin to feel better physically. Usually, morning sickness has lessened or quit completely, you may have more energy, and you may have an increase in appetite. The second trimester is also a time when the fetus is growing rapidly. At the end of the sixth month, the fetus is about 9 inches long and weighs about 1 pounds. You will likely begin to feel the baby move (quickening) between 18 and 20 weeks of the pregnancy. BODY CHANGES Your body goes through many changes during pregnancy. The changes vary from woman to woman.   Your weight will continue to increase. You will notice your lower abdomen bulging out.  You may begin to get stretch marks on your hips, abdomen, and breasts.  You may develop headaches that can be relieved by medicines approved by your health care provider.  You may urinate more often because the fetus is pressing on your bladder.  You may develop or continue to have heartburn as a result of your pregnancy.  You may develop constipation because certain hormones are causing the muscles that push waste through your intestines to slow down.  You may develop hemorrhoids or swollen, bulging veins (varicose veins).  You may have back pain because of the weight gain and pregnancy hormones relaxing your joints between the bones in your pelvis and as a result of a shift in weight and the muscles that support your balance.  Your breasts will continue to grow and be tender.  Your gums may bleed and may be sensitive to brushing and flossing.  Dark spots or blotches (chloasma, mask of pregnancy) may develop on your face. This will likely fade after the baby is born.  A dark line from your belly button to the pubic area (linea nigra) may appear. This will likely fade  after the baby is born.  You may have changes in your hair. These can include thickening of your hair, rapid growth, and changes in texture. Some women also have hair loss during or after pregnancy, or hair that feels dry or thin. Your hair will most likely return to normal after your baby is born. WHAT TO EXPECT AT YOUR PRENATAL VISITS During a routine prenatal visit:  You will be weighed to make sure you and the fetus are growing normally.  Your blood pressure will be taken.  Your abdomen will be measured to track your baby's growth.  The fetal heartbeat will be listened to.  Any test results from the previous visit will be discussed. Your health care provider may ask you:  How you are feeling.  If you are feeling the baby move.  If you have had any abnormal symptoms, such as leaking fluid, bleeding, severe headaches, or abdominal cramping.  If you have any questions. Other tests that may be performed during your second trimester include:  Blood tests that check for:  Low iron levels (anemia).  Gestational diabetes (between 24 and 28 weeks).  Rh antibodies.  Urine tests to check for infections, diabetes, or protein in the urine.  An ultrasound to confirm the proper growth and development of the baby.  An amniocentesis to check for possible genetic problems.  Fetal screens for spina bifida and Down syndrome. HOME CARE INSTRUCTIONS   Avoid all smoking, herbs, alcohol, and unprescribed   drugs. These chemicals affect the formation and growth of the baby.  Follow your health care provider's instructions regarding medicine use. There are medicines that are either safe or unsafe to take during pregnancy.  Exercise only as directed by your health care provider. Experiencing uterine cramps is a good sign to stop exercising.  Continue to eat regular, healthy meals.  Wear a good support bra for breast tenderness.  Do not use hot tubs, steam rooms, or saunas.  Wear your  seat belt at all times when driving.  Avoid raw meat, uncooked cheese, cat litter boxes, and soil used by cats. These carry germs that can cause birth defects in the baby.  Take your prenatal vitamins.  Try taking a stool softener (if your health care provider approves) if you develop constipation. Eat more high-fiber foods, such as fresh vegetables or fruit and whole grains. Drink plenty of fluids to keep your urine clear or pale yellow.  Take warm sitz baths to soothe any pain or discomfort caused by hemorrhoids. Use hemorrhoid cream if your health care provider approves.  If you develop varicose veins, wear support hose. Elevate your feet for 15 minutes, 3-4 times a day. Limit salt in your diet.  Avoid heavy lifting, wear low heel shoes, and practice good posture.  Rest with your legs elevated if you have leg cramps or low back pain.  Visit your dentist if you have not gone yet during your pregnancy. Use a soft toothbrush to brush your teeth and be gentle when you floss.  A sexual relationship may be continued unless your health care provider directs you otherwise.  Continue to go to all your prenatal visits as directed by your health care provider. SEEK MEDICAL CARE IF:   You have dizziness.  You have mild pelvic cramps, pelvic pressure, or nagging pain in the abdominal area.  You have persistent nausea, vomiting, or diarrhea.  You have a bad smelling vaginal discharge.  You have pain with urination. SEEK IMMEDIATE MEDICAL CARE IF:   You have a fever.  You are leaking fluid from your vagina.  You have spotting or bleeding from your vagina.  You have severe abdominal cramping or pain.  You have rapid weight gain or loss.  You have shortness of breath with chest pain.  You notice sudden or extreme swelling of your face, hands, ankles, feet, or legs.  You have not felt your baby move in over an hour.  You have severe headaches that do not go away with  medicine.  You have vision changes. Document Released: 07/26/2001 Document Revised: 08/06/2013 Document Reviewed: 10/02/2012 [employer] Patient Information 2015 [employer]. This information is not intended to replace advice given to you by your health care provider. Make sure you discuss any questions you have with your health care provider.  

## 2014-11-06 LAB — US OB DETAIL + 14 WK

## 2014-11-13 ENCOUNTER — Telehealth: Payer: Self-pay | Admitting: Adult Health

## 2014-11-13 ENCOUNTER — Telehealth: Payer: Self-pay | Admitting: *Deleted

## 2014-11-13 NOTE — Telephone Encounter (Signed)
Pt requesting a note faxed to [facility] @ [phone] stating it is safe for her to have a TB skin test done during pregnancy. Will fax per pt request.

## 2014-11-13 NOTE — Telephone Encounter (Signed)
I spoke with Dorothy Flowers stating it was safe for pt to receive a TB skin test during pregnancy. Faxed letter to [phone] Attn: Dorothy Flowers. JSY

## 2014-11-26 ENCOUNTER — Ambulatory Visit (INDEPENDENT_AMBULATORY_CARE_PROVIDER_SITE_OTHER): Payer: Medicaid Other | Admitting: Advanced Practice Midwife

## 2014-11-26 VITALS — BP 98/50 | HR 80 | Wt 283.0 lb

## 2014-11-26 DIAGNOSIS — Z3402 Encounter for supervision of normal first pregnancy, second trimester: Secondary | ICD-10-CM

## 2014-11-26 DIAGNOSIS — Z1389 Encounter for screening for other disorder: Secondary | ICD-10-CM

## 2014-11-26 DIAGNOSIS — Z363 Encounter for antenatal screening for malformations: Secondary | ICD-10-CM

## 2014-11-26 DIAGNOSIS — Z331 Pregnant state, incidental: Secondary | ICD-10-CM

## 2014-11-26 LAB — POCT URINALYSIS DIPSTICK
Glucose, UA: NEGATIVE
Ketones, UA: NEGATIVE
Nitrite, UA: NEGATIVE
PROTEIN UA: NEGATIVE

## 2014-11-26 NOTE — Patient Instructions (Signed)

## 2014-11-26 NOTE — Progress Notes (Signed)
G1P0 4271w3d Estimated Date of Delivery: 03/15/15  Blood pressure 98/50, pulse 80, weight 283 lb (128.368 kg), last menstrual period 06/08/2014.   BP weight and urine results all reviewed and noted.  Please refer to the obstetrical flow sheet for the fundal height and fetal heart rate documentation:  Patient reports good fetal movement, denies any bleeding and no rupture of membranes symptoms or regular contractions. Patient is without complaints. All questions were answered.  Plan:  Continued routine obstetrical care,   Follow up in 3 weeks for OB appointment, PN2, and recheck anatomy [place]

## 2014-12-08 ENCOUNTER — Telehealth: Payer: Self-pay | Admitting: Advanced Practice Midwife

## 2014-12-08 DIAGNOSIS — Z3402 Encounter for supervision of normal first pregnancy, second trimester: Secondary | ICD-10-CM

## 2014-12-08 NOTE — Telephone Encounter (Signed)
Pt states that she went to the bathroom this morning and she had clear mucus coming out of her. Pt states that she had a little clump of it come out. Pt states that she has not had any discharge. Pt states that it looked like someone blew their nose. Pt denies any bleeding or gush of fluid. Pt has an appointment on may 4th. Pt denies any contractions.I  I spoke with Dr. Emelda FearFerguson and he advised that the pt would need to be seen before her appointment but [facility]    [facility] has bleeding or a gush of fluid or if she starts having contractions. Pt verbalized understanding. Pt was given an appointment for 11 tomorrow with Drenda FreezeFran.

## 2014-12-09 ENCOUNTER — Encounter: Payer: Self-pay | Admitting: Advanced Practice Midwife

## 2014-12-09 ENCOUNTER — Ambulatory Visit (INDEPENDENT_AMBULATORY_CARE_PROVIDER_SITE_OTHER): Payer: Medicaid Other | Admitting: Advanced Practice Midwife

## 2014-12-09 VITALS — BP 100/52 | HR 72 | Wt 284.0 lb

## 2014-12-09 DIAGNOSIS — Z3402 Encounter for supervision of normal first pregnancy, second trimester: Secondary | ICD-10-CM

## 2014-12-09 DIAGNOSIS — O26892 Other specified pregnancy related conditions, second trimester: Secondary | ICD-10-CM

## 2014-12-09 DIAGNOSIS — N898 Other specified noninflammatory disorders of vagina: Secondary | ICD-10-CM

## 2014-12-09 DIAGNOSIS — Z331 Pregnant state, incidental: Secondary | ICD-10-CM

## 2014-12-09 DIAGNOSIS — Z1389 Encounter for screening for other disorder: Secondary | ICD-10-CM

## 2014-12-09 LAB — POCT URINALYSIS DIPSTICK
Blood, UA: NEGATIVE
GLUCOSE UA: NEGATIVE
Ketones, UA: NEGATIVE
Leukocytes, UA: NEGATIVE
Nitrite, UA: NEGATIVE
Protein, UA: NEGATIVE

## 2014-12-09 NOTE — Progress Notes (Signed)
Pt states that she is still having the snotty discharge and she has had some cramping at times as well. Pt states that she is vomiting a lot as well and a little lower back pain.

## 2014-12-09 NOTE — Progress Notes (Signed)
WORK IN FOR "SNOTTY DISCHARGE".  C/O 2 hours cramping last night, "a little" when she first got up this am. . FFN collected.  CX LTC, soft.  C/O LBP. Exercisres/LBP handout given.  PTL precautions discussed.

## 2014-12-09 NOTE — Patient Instructions (Signed)
Back Exercises °Back exercises help treat and prevent back injuries. The goal of back exercises is to increase the strength of your abdominal and back muscles and the flexibility of your back. These exercises should be started when you no longer have back pain. Back exercises include: °· Pelvic Tilt. Lie on your back with your knees bent. Tilt your pelvis until the lower part of your back is against the floor. Hold this position 5 to 10 sec and repeat 5 to 10 times. °· Knee to Chest. Pull first 1 knee up against your chest and hold for 20 to 30 seconds, repeat this with the other knee, and then both knees. This may be done with the other leg straight or bent, whichever feels better. °· Sit-Ups or Curl-Ups. Bend your knees 90 degrees. Start with tilting your pelvis, and do a partial, slow sit-up, lifting your trunk only 30 to 45 degrees off the floor. Take at least 2 to 3 seconds for each sit-up. Do not do sit-ups with your knees out straight. If partial sit-ups are difficult, simply do the above but with only tightening your abdominal muscles and holding it as directed. °· Hip-Lift. Lie on your back with your knees flexed 90 degrees. Push down with your feet and shoulders as you raise your hips a couple inches off the floor; hold for 10 seconds, repeat 5 to 10 times. °· Back arches. Lie on your stomach, propping yourself up on bent elbows. Slowly press on your hands, causing an arch in your low back. Repeat 3 to 5 times. Any initial stiffness and discomfort should lessen with repetition over time. °· Shoulder-Lifts. Lie face down with arms beside your body. Keep hips and torso pressed to floor as you slowly lift your head and shoulders off the floor. °Do not overdo your exercises, especially in the beginning. Exercises may cause you some mild back discomfort which lasts for a few minutes; however, if the pain is more severe, or lasts for more than 15 minutes, do not continue exercises until you see your caregiver.  Improvement with exercise therapy for back problems is slow.  °See your caregivers for assistance with developing a proper back exercise program. °Document Released: 09/08/2004 Document Revised: 10/24/2011 Document Reviewed: 06/02/2011 °ExitCare® Patient Information ©2015 [employer]. This information is not intended to replace advice given to you by your health care provider. Make sure you discuss any questions you have with your health care provider. °Back Pain in Pregnancy °Back pain during pregnancy is common. It happens in about half of all pregnancies. It is important for you and your baby that you remain active during your pregnancy. If you feel that back pain is not allowing you to remain active or sleep well, it is time to see your caregiver. Back pain may be caused by several factors related to changes during your pregnancy. Fortunately, unless you had trouble with your back before your pregnancy, the pain is likely to get better after you deliver. °Low back pain usually occurs between the fifth and seventh months of pregnancy. It can, however, happen in the first couple months. Factors that increase the risk of back problems include:  °· Previous back problems. °· Injury to your back. °· Having twins or multiple births. °· A chronic cough. °· Stress. °· Job-related repetitive motions. °· Muscle or spinal disease in the back. °· Family history of back problems, ruptured (herniated) discs, or osteoporosis. °· Depression, anxiety, and panic attacks. °CAUSES  °· When you are pregnant, your body   produces a hormone called relaxin. This hormone makes the ligaments connecting the low back and pubic bones more flexible. This flexibility allows the baby to be delivered more easily. When your ligaments are loose, your muscles need to work harder to support your back. Soreness in your back can come from tired muscles. Soreness can also come from back tissues that are irritated since they are receiving less  support. °· As the baby grows, it puts pressure on the nerves and blood vessels in your pelvis. This can cause back pain. °· As the baby grows and gets heavier during pregnancy, the uterus pushes the stomach muscles forward and changes your center of gravity. This makes your back muscles work harder to maintain good posture. °SYMPTOMS  °Lumbar pain during pregnancy °Lumbar pain during pregnancy usually occurs at or above the waist in the center of the back. There may be pain and numbness that radiates into your leg or foot. This is similar to low back pain experienced by non-pregnant women. It usually increases with sitting for long periods of time, standing, or repetitive lifting. Tenderness may also be present in the muscles along your upper back. °Posterior pelvic pain during pregnancy °Pain in the back of the pelvis is more common than lumbar pain in pregnancy. It is a deep pain felt in your side at the waistline, or across the tailbone (sacrum), or in both places. You may have pain on one or both sides. This pain can also go into the buttocks and backs of the upper thighs. Pubic and groin pain may also be present. The pain does not quickly resolve with rest, and morning stiffness may also be present. °Pelvic pain during pregnancy can be brought on by most activities. A high level of fitness before and during pregnancy may or may not prevent this problem. Labor pain is usually 1 to 2 minutes apart, lasts for about 1 minute, and involves a bearing down feeling or pressure in your pelvis. However, if you are at term with the pregnancy, constant low back pain can be the beginning of early labor, and you should be aware of this. °DIAGNOSIS  °X-rays of the back should not be done during the first 12 to 14 weeks of the pregnancy and only when absolutely necessary during the rest of the pregnancy. MRIs do not give off radiation and are safe during pregnancy. MRIs also should only be done when absolutely  necessary. °HOME CARE INSTRUCTIONS °· Exercise as directed by your caregiver. Exercise is the most effective way to prevent or manage back pain. If you have a back problem, it is especially important to avoid sports that require sudden body movements. Swimming and walking are great activities. °· Do not stand in one place for long periods of time. °· Do not wear high heels. °· Sit in chairs with good posture. Use a pillow on your lower back if necessary. Make sure your head rests over your shoulders and is not hanging forward. °· Try sleeping on your side, preferably the left side, with a pillow or two between your legs. If you are sore after a night's rest, your bed may be too soft. Try placing a board between your mattress and box spring. °· Listen to your body when lifting. If you are experiencing pain, ask for help or try bending your knees more so you can use your leg muscles rather than your back muscles. Squat down when picking up something from the floor. Do not bend over. °· Eat a healthy diet. Try to gain   weight within your caregiver's recommendations. °· Use heat or cold packs 3 to 4 times a day for 15 minutes to help with the pain. °· Only take over-the-counter or prescription medicines for pain, discomfort, or fever as directed by your caregiver. °Sudden (acute) back pain °· Use bed rest for only the most extreme, acute episodes of back pain. Prolonged bed rest over 48 hours will aggravate your condition. °· Ice is very effective for acute conditions. °¨ Put ice in a plastic bag. °¨ Place a towel between your skin and the bag. °¨ Leave the ice on for 10 to 20 minutes every 2 hours, or as needed. °· Using heat packs for 30 minutes prior to activities is also helpful. °Continued back pain °See your caregiver if you have continued problems. Your caregiver can help or refer you for appropriate physical therapy. With conditioning, most back problems can be avoided. Sometimes, a more serious issue may be the  cause of back pain. You should be seen right away if new problems seem to be developing. Your caregiver may recommend: °· A maternity girdle. °· An elastic sling. °· A back brace. °· A massage therapist or acupuncture. °SEEK MEDICAL CARE IF:  °· You are not able to do most of your daily activities, even when taking the pain medicine you were given. °· You need a referral to a physical therapist or chiropractor. °· You want to try acupuncture. °SEEK IMMEDIATE MEDICAL CARE IF: °· You develop numbness, tingling, weakness, or problems with the use of your arms or legs. °· You develop severe back pain that is no longer relieved with medicines. °· You have a sudden change in bowel or bladder control. °· You have increasing pain in other areas of the body. °· You develop shortness of breath, dizziness, or fainting. °· You develop nausea, vomiting, or sweating. °· You have back pain which is similar to labor pains. °· You have back pain along with your water breaking or vaginal bleeding. °· You have back pain or numbness that travels down your leg. °· Your back pain developed after you fell. °· You develop pain on one side of your back. You may have a kidney stone. °· You see blood in your urine. You may have a bladder infection or kidney stone. °· You have back pain with blisters. You may have shingles. °Back pain is fairly common during pregnancy but should not be accepted as just part of the process. Back pain should always be treated as soon as possible. This will make your pregnancy as pleasant as possible. °Document Released: 11/09/2005 Document Revised: 10/24/2011 Document Reviewed: 12/21/2010 °ExitCare® Patient Information ©2015 [employer]. This information is not intended to replace advice given to you by your health care provider. Make sure you discuss any questions you have with your health care provider. ° °

## 2014-12-10 ENCOUNTER — Telehealth: Payer: Self-pay | Admitting: Advanced Practice Midwife

## 2014-12-10 ENCOUNTER — Telehealth: Payer: Self-pay | Admitting: *Deleted

## 2014-12-10 DIAGNOSIS — Z3402 Encounter for supervision of normal first pregnancy, second trimester: Secondary | ICD-10-CM

## 2014-12-10 LAB — FETAL FIBRONECTIN: Fetal Fibronectin: NEGATIVE

## 2014-12-10 NOTE — Telephone Encounter (Signed)
Pt informed that the FFN test was negative.

## 2014-12-10 NOTE — Telephone Encounter (Signed)
[facility] called with a Stat result from a FFN done yesterday. FFN was negative.

## 2014-12-18 ENCOUNTER — Ambulatory Visit (INDEPENDENT_AMBULATORY_CARE_PROVIDER_SITE_OTHER): Payer: Medicaid Other

## 2014-12-18 ENCOUNTER — Ambulatory Visit (INDEPENDENT_AMBULATORY_CARE_PROVIDER_SITE_OTHER): Payer: Medicaid Other | Admitting: Advanced Practice Midwife

## 2014-12-18 ENCOUNTER — Other Ambulatory Visit: Payer: Medicaid Other

## 2014-12-18 VITALS — BP 104/60 | HR 86 | Wt 282.0 lb

## 2014-12-18 DIAGNOSIS — Z1389 Encounter for screening for other disorder: Secondary | ICD-10-CM

## 2014-12-18 DIAGNOSIS — Z131 Encounter for screening for diabetes mellitus: Secondary | ICD-10-CM

## 2014-12-18 DIAGNOSIS — Z3402 Encounter for supervision of normal first pregnancy, second trimester: Secondary | ICD-10-CM

## 2014-12-18 DIAGNOSIS — Z331 Pregnant state, incidental: Secondary | ICD-10-CM

## 2014-12-18 DIAGNOSIS — Z363 Encounter for antenatal screening for malformations: Secondary | ICD-10-CM

## 2014-12-18 DIAGNOSIS — O99213 Obesity complicating pregnancy, third trimester: Secondary | ICD-10-CM

## 2014-12-18 DIAGNOSIS — Z369 Encounter for antenatal screening, unspecified: Secondary | ICD-10-CM

## 2014-12-18 LAB — POCT URINALYSIS DIPSTICK
Blood, UA: NEGATIVE
Glucose, UA: NEGATIVE
Ketones, UA: NEGATIVE
LEUKOCYTES UA: NEGATIVE
NITRITE UA: NEGATIVE

## 2014-12-18 MED ORDER — AMOXICILLIN 500 MG PO CAPS: 500.0000 mg | ORAL_CAPSULE | Freq: Three times a day (TID) | ORAL | Status: DC

## 2014-12-18 NOTE — Progress Notes (Signed)
G1P0 2665w4d Estimated Date of Delivery: 03/15/15  Blood pressure 104/60, pulse 86, weight 282 lb (127.914 kg), last menstrual period 06/08/2014.   BP weight and urine results all reviewed and noted.  Please refer to the obstetrical flow sheet for the fundal height and fetal heart rate documentation: US today to recheck anatomy:  All normal   Patient reports good fetal movement, denies any bleeding and no rupture of membranes symptoms or regular contractions. Patient c/o right sided tooth pain. Has appt with dentist in 2 weeks.  Erythemous and can feel about a 2 cm abscessed area All questions were answered.  Plan:  Continued routine obstetrical care, amoxicillin 500mg  TID.   Follow up in 3 weeks for OB appointment,

## 2014-12-18 NOTE — Progress Notes (Signed)
US 6382w4d, measurements c/w dates, pos pl gr 1,  Cephalic,afi 13.31cm,fht 141bpm,adnexa's wnl, EFW 1281g 69.8%,cranial anatomy not seen because of fetal pos and pt body habitus.

## 2014-12-19 LAB — CBC
Hematocrit: 33.8 % — ABNORMAL LOW (ref 34.0–46.6)
Hemoglobin: 11.3 g/dL (ref 11.1–15.9)
MCH: 29.2 pg (ref 26.6–33.0)
MCHC: 33.4 g/dL (ref 31.5–35.7)
MCV: 87 fL (ref 79–97)
Platelets: 322 10*3/uL (ref 150–379)
RBC: 3.87 x10E6/uL (ref 3.77–5.28)
RDW: 13.3 % (ref 12.3–15.4)
WBC: 9.2 10*3/uL (ref 3.4–10.8)

## 2014-12-19 LAB — GLUCOSE TOLERANCE, 2 HOURS W/ 1HR
GLUCOSE, 1 HOUR: 164 mg/dL (ref 65–179)
Glucose, 2 hour: 122 mg/dL (ref 65–152)
Glucose, Fasting: 83 mg/dL (ref 65–91)

## 2014-12-19 LAB — HIV ANTIBODY (ROUTINE TESTING W REFLEX): HIV Screen 4th Generation wRfx: NONREACTIVE

## 2014-12-19 LAB — RPR: RPR Ser Ql: NONREACTIVE

## 2014-12-19 LAB — ANTIBODY SCREEN: Antibody Screen: NEGATIVE

## 2014-12-19 LAB — HSV 2 ANTIBODY, IGG: HSV 2 Glycoprotein G Ab, IgG: <0.91 index (ref 0.00–0.90)

## 2015-01-01 ENCOUNTER — Inpatient Hospital Stay (HOSPITAL_COMMUNITY)
Admission: AD | Admit: 2015-01-01 | Discharge: 2015-01-01 | Disposition: A | Discharge status: Home / Self Care | Payer: Medicaid Other | Source: Ambulatory Visit | Attending: Obstetrics & Gynecology | Admitting: Obstetrics & Gynecology

## 2015-01-01 ENCOUNTER — Encounter (HOSPITAL_COMMUNITY): Payer: Self-pay | Admitting: *Deleted

## 2015-01-01 DIAGNOSIS — Z87891 Personal history of nicotine dependence: Secondary | ICD-10-CM | POA: Diagnosis not present

## 2015-01-01 DIAGNOSIS — N949 Unspecified condition associated with female genital organs and menstrual cycle: Secondary | ICD-10-CM

## 2015-01-01 DIAGNOSIS — O99213 Obesity complicating pregnancy, third trimester: Secondary | ICD-10-CM | POA: Diagnosis not present

## 2015-01-01 DIAGNOSIS — Z3403 Encounter for supervision of normal first pregnancy, third trimester: Secondary | ICD-10-CM | POA: Insufficient documentation

## 2015-01-01 DIAGNOSIS — Z3A29 29 weeks gestation of pregnancy: Secondary | ICD-10-CM | POA: Diagnosis not present

## 2015-01-01 DIAGNOSIS — R109 Unspecified abdominal pain: Secondary | ICD-10-CM | POA: Diagnosis present

## 2015-01-01 DIAGNOSIS — R102 Pelvic and perineal pain: Secondary | ICD-10-CM | POA: Insufficient documentation

## 2015-01-01 DIAGNOSIS — O9989 Other specified diseases and conditions complicating pregnancy, childbirth and the puerperium: Secondary | ICD-10-CM | POA: Diagnosis not present

## 2015-01-01 DIAGNOSIS — Z3402 Encounter for supervision of normal first pregnancy, second trimester: Secondary | ICD-10-CM

## 2015-01-01 HISTORY — DX: Unspecified asthma, uncomplicated: J45.909

## 2015-01-01 LAB — URINALYSIS, ROUTINE W REFLEX MICROSCOPIC
Bilirubin Urine: NEGATIVE
GLUCOSE, UA: NEGATIVE mg/dL
Hgb urine dipstick: NEGATIVE
KETONES UR: NEGATIVE mg/dL
Leukocytes, UA: NEGATIVE
Nitrite: NEGATIVE
Protein, ur: NEGATIVE mg/dL
Specific Gravity, Urine: 1.01 (ref 1.005–1.030)
Urobilinogen, UA: 0.2 mg/dL (ref 0.0–1.0)
pH: 7.5 (ref 5.0–8.0)

## 2015-01-01 NOTE — MAU Note (Signed)
Pt stated she is having sharp pain  To her left abd.several times today. Nd vomited x2. Good fetal movement

## 2015-01-01 NOTE — MAU Provider Note (Signed)
Chief Complaint:  Abdominal Pain and Emesis   First Provider Initiated Contact with Patient 01/01/15 1136     HPI: Dorothy Flowers is a 29 y.o. G1P0 at [redacted]w[redacted]d by L/1st trimester scan who presents to maternity admissions reporting sharp L sided abdominal pain.  Reports last night she was laying in bed and noted two sharp pains in L abdomen, each lasting about 3 seconds and then re-occuring about 30 min later. Denies any VB, LOF, or vaginal discharge. No recent intercourse. Stated pain felt like "stepping on a nail." Said pain was so intense it made her vomit x 2, but does admit to significant nausea during the pregnancy and states vomiting is not abnormal to her  This AM woke up and called nursing line to tell about complaints and nurse told her to come to MAU. On the way to MAU in the car felt two additional sharp pains, again lasting about 3 seconds and completely disappearing.  Pain only on L side of lower-mid abdomen. No recent trauma.   Denies contractions, leakage of fluid or vaginal bleeding. Good fetal movement.   Pregnancy Course:  Morbid Obesity  Past Medical History: Past Medical History  Diagnosis Date  . Asthma     as child    Past obstetric history: OB History  Gravida Para Term Preterm AB SAB TAB Ectopic Multiple Living  1             # Outcome Date GA Lbr Len/2nd Weight Sex Delivery Anes PTL Lv  1 Current               Past Surgical History: Past Surgical History  Procedure Laterality Date  . Wisdom tooth extraction       Family History: Family History  Problem Relation Age of Onset  . Hypertension Mother   . Stroke Mother   . Diabetes Maternal Aunt   . Diabetes Maternal Grandmother   . Heart disease Father   . Hypertension Father   . Mental illness Paternal Uncle   . COPD Paternal Grandmother     smokes  . Cancer Paternal Grandfather   . Heart disease Paternal Grandfather     Social History: History  Substance Use Topics  . Smoking status:  Former Smoker -- 0.00 packs/day  . Smokeless tobacco: Never Used     Comment: quit with + preg  . Alcohol Use: No    Allergies:  Allergies  Allergen Reactions  . Keflex [Cephalexin] Shortness Of Breath and Rash  . Cephalosporins Swelling and Other (See Comments)    Pain    Meds:  No prescriptions prior to admission    ROS:  ROS  Physical Exam  Blood pressure 115/57, pulse 95, temperature 98.5 F (36.9 C), temperature source Oral, resp. rate 18, height 5' 3.75" (1.619 m), weight 281 lb 12.8 oz (127.824 kg), last menstrual period 06/08/2014. GENERAL: Well-developed, well-nourished female in no acute distress.  HEART: normal rate RESP: normal effort GI: Morbidly obese; Abd soft, non-tender, gravid appropriate for gestational age. Pos BS x 4. No bruising noted MS: Extremities nontender, no edema, normal ROM NEURO: Alert and oriented x 4.  GU: NEFG, physiologic discharge, no blood, cervix clean. No CVAT Dilation: Closed Effacement (%): Thick Exam by:: Dorothy Flowers, Dorothy Flowers  FHT:  Baseline 150 , moderate variability, accelerations present, occasional variable decelerations, appropriate for GA Contractions: none   Labs: Results for orders placed or performed during the hospital encounter of 01/01/15 (from the past 24 hour(s))  Urinalysis, Routine  w reflex microscopic     Status: None   Collection Time: 01/01/15  9:38 AM  Result Value Ref Range   Color, Urine YELLOW YELLOW   APPearance CLEAR CLEAR   Specific Gravity, Urine 1.010 1.005 - 1.030   pH 7.5 5.0 - 8.0   Glucose, UA NEGATIVE NEGATIVE mg/dL   Hgb urine dipstick NEGATIVE NEGATIVE   Bilirubin Urine NEGATIVE NEGATIVE   Ketones, ur NEGATIVE NEGATIVE mg/dL   Protein, ur NEGATIVE NEGATIVE mg/dL   Urobilinogen, UA 0.2 0.0 - 1.0 mg/dL   Nitrite NEGATIVE NEGATIVE   Leukocytes, UA NEGATIVE NEGATIVE    Imaging:  [place] [facility] Follow Up  12/31/2014   FOLLOW UP SONOGRAM   Dorothy Flowers is in the office for a follow up sonogram  for recheck  fetal anatomy and efw.  She is a 29 y.o. year old G1P0 with Estimated Date of Delivery: 03/15/15 by  LMP now at  7034w4d weeks gestation. Thus far the pregnancy has been  complicated by obesity..  GESTATION: SINGLETON  PRESENTATION: cephalic  FETAL ACTIVITY:          Heart rate         141bpm          The fetus is active.  AMNIOTIC FLUID: The amniotic fluid volume is  normal, 13.31 cm.  PLACENTA LOCALIZATION:  posterior GRADE 1  CERVIX:limited view  ADNEXA: The ovaries are normal.   GESTATIONAL AGE AND  BIOMETRICS:  Gestational criteria: Estimated Date of Delivery: 03/15/15 by LMP now at  7634w4d  Previous Scans:4              BIPARIETAL DIAMETER           7.23 cm         29 weeks  HEAD CIRCUMFERENCE           26.90 cm         29+2 weeks  ABDOMINAL CIRCUMFERENCE           24.35 cm         28+4 weeks  FEMUR LENGTH           5.37 cm         28+3 weeks                                                           AVERAGE EGA(BY THIS SCAN):   1281 weeks                                                 ESTIMATED FETAL WEIGHT:        197  grams, 69.8 % ANATOMICAL SURVEY                                                                             COMMENTS CEREBRAL VENTRICLES no  Unable to see  CHOROID PLEXUS no  Unable to see  CEREBELLUM no  Unable to see  CISTERNA MAGNA no  Unable to see  NUCHAL REGION no  Unable to see  ORBITS     NASAL BONE     NOSE/LIP     FACIAL PROFILE     4 CHAMBERED HEART     OUTFLOW TRACTS     DIAPHRAGM yes normal   STOMACH yes normal   RENAL REGION yes normal   BLADDER yes normal   CORD INSERTION     3 VESSEL CORD     SPINE     ARMS/HANDS     LEGS/FEET     GENITALIA yes normal female        SUSPECTED ABNORMALITIES:  no  QUALITY OF SCAN: unsatisfactory, limited view of head because of pt body habitus.  TECHNICIAN COMMENTS:    [place] [redacted]w[redacted]d, measurements c/w dates, pos pl gr 1,  Cephalic,afi 13.31cm,fht  141bpm,adnexa's wnl, EFW 1281g 69.8%,cranial anatomy not seen because of  fetal pos and pt body  habitus.  A copy of this report including all images has been saved and backed up to  a second source for retrieval if needed. All measures and details of the  anatomical scan, placentation, fluid volume and pelvic anatomy are  contained in that report.  Amber Flora Lipps 12/18/2014 10:44 AM   Clinical Impression and recommendations:  I have reviewed the sonogram results above, combined with the patient's  current clinical course, below are my impressions and any appropriate  recommendations for management based on the sonographic findings.   1.  G1P0 Estimated Date of Delivery: 03/15/15 by  LMP, early ultrasound and  confirmed by today's sonographic dating 2.  Normal fetal sonographic findings, unable to view all anatomy due to  fetal position,  no abnormalities noted, previously no abnormalities  either 3.  Normal general sonographic findings  Recommend routine prenatal care based on this sonogram or as clinically  indicated  EURE,LUTHER H          MAU Course: SVE with cervix c/t/h. Toco quiet. Per hx and PE, likely c/w round ligament pain.  Assessment: 1. Encounter for supervision of normal first pregnancy in second trimester   2. Round ligament pain     Plan:  Discussed likely round ligament pain, recommended tylenol, warm baths, laying with pillow and abdominal band for support. Not in labor.  Discharge home in stable condition.  PTL precautions and fetal kick counts     Follow-up Information    Please follow up.   Why:  If symptoms worsen         Medication List    TAKE these medications        albuterol 108 (90 BASE) MCG/ACT inhaler  Commonly known as:  PROVENTIL HFA;VENTOLIN HFA  Inhale 1-2 puffs into the lungs every 6 (six) hours as needed for wheezing or shortness of breath.     amoxicillin 500 MG capsule  Commonly known as:  AMOXIL  Take 1 capsule (500 mg total) by mouth 3 (three) times daily.     Prenatal Vitamins 0.8 MG tablet  Take 1 tablet by mouth daily.         Dorothy Chick, MD 01/01/2015 1:05 PM

## 2015-01-08 ENCOUNTER — Encounter: Payer: Self-pay | Admitting: Advanced Practice Midwife

## 2015-01-08 ENCOUNTER — Ambulatory Visit (INDEPENDENT_AMBULATORY_CARE_PROVIDER_SITE_OTHER): Payer: Medicaid Other | Admitting: Advanced Practice Midwife

## 2015-01-08 VITALS — BP 102/74 | HR 64 | Wt 282.0 lb

## 2015-01-08 DIAGNOSIS — Z1389 Encounter for screening for other disorder: Secondary | ICD-10-CM

## 2015-01-08 DIAGNOSIS — Z331 Pregnant state, incidental: Secondary | ICD-10-CM

## 2015-01-08 DIAGNOSIS — Z3403 Encounter for supervision of normal first pregnancy, third trimester: Secondary | ICD-10-CM

## 2015-01-08 LAB — POCT URINALYSIS DIPSTICK
Blood, UA: NEGATIVE
GLUCOSE UA: NEGATIVE
KETONES UA: NEGATIVE
Leukocytes, UA: NEGATIVE
NITRITE UA: NEGATIVE
Protein, UA: NEGATIVE

## 2015-01-08 NOTE — Progress Notes (Signed)
G1P0 7176w4d Estimated Date of Delivery: 03/15/15  Last menstrual period 06/08/2014.   BP weight and urine results all reviewed and noted.  Please refer to the obstetrical flow sheet for the fundal height and fetal heart rate documentation:  Patient reports good fetal movement, denies any bleeding and no rupture of membranes symptoms or regular contractions. Patient is without complaints. All questions were answered.  Plan:  Continued routine obstetrical care,   Follow up in 2 weeks for OB appointment,

## 2015-01-08 NOTE — Progress Notes (Signed)
Pt states that she is still having round ligament pain, [facility]

## 2015-01-21 ENCOUNTER — Encounter: Payer: Self-pay | Admitting: Women's Health

## 2015-01-21 ENCOUNTER — Ambulatory Visit (INDEPENDENT_AMBULATORY_CARE_PROVIDER_SITE_OTHER): Payer: Medicaid Other | Admitting: Women's Health

## 2015-01-21 VITALS — BP 128/80 | HR 74 | Wt 285.4 lb

## 2015-01-21 DIAGNOSIS — Z3403 Encounter for supervision of normal first pregnancy, third trimester: Secondary | ICD-10-CM

## 2015-01-21 DIAGNOSIS — Z1389 Encounter for screening for other disorder: Secondary | ICD-10-CM

## 2015-01-21 DIAGNOSIS — Z331 Pregnant state, incidental: Secondary | ICD-10-CM

## 2015-01-21 LAB — POCT URINALYSIS DIPSTICK
Glucose, UA: NEGATIVE
Ketones, UA: NEGATIVE
LEUKOCYTES UA: NEGATIVE
Nitrite, UA: NEGATIVE
Protein, UA: NEGATIVE
RBC UA: NEGATIVE

## 2015-01-21 NOTE — Patient Instructions (Addendum)
Call the office [phone]) or go to [facility] if:  You begin to have strong, frequent contractions  Your water breaks.  Sometimes it is a big gush of fluid, sometimes it is just a trickle that keeps getting your panties wet or running down your legs  You have vaginal bleeding.  It is normal to have a small amount of spotting if your cervix was checked.   You don't feel your baby moving like normal.  If you don't, get you something to eat and drink and lay down and focus on feeling your baby move.  You should feel at least 10 movements in 2 hours.  If you don't, you should call the office or go to [facility].     Tdap Vaccine  It is recommended that you get the Tdap vaccine during the third trimester of EACH pregnancy to help protect your baby from getting pertussis (whooping cough)  27-36 weeks is the BEST time to do this so that you can pass the protection on to your baby. During pregnancy is better than after pregnancy, but if you are unable to get it during pregnancy it will be offered at the hospital.   You can get this vaccine at the health department or your family doctor  Everyone who will be around your baby should also be up-to-date on their vaccines. Adults (who are not pregnant) only need 1 dose of Tdap during adulthood.     [place] Pediatricians/Family Doctors:  [facility] [phone]            [facility] [phone]                 [facility] [phone] (usually not accepting new patients unless you have family there already, you are always welcome to call and ask)            [facility] ([address] [place]) [phone]   [facility]/Family [facility]:   [facility]: [phone]  [facility]: [phone]   Preterm Labor Information Preterm labor is when labor starts at less than 37 weeks of pregnancy. The normal length of a pregnancy is 39 to 41  weeks. CAUSES Often, there is no identifiable underlying cause as to why a woman goes into preterm labor. One of the most common known causes of preterm labor is infection. Infections of the uterus, cervix, vagina, amniotic sac, bladder, kidney, or even the lungs (pneumonia) can cause labor to start. Other suspected causes of preterm labor include:  9. Urogenital infections, such as yeast infections and bacterial vaginosis.  10. Uterine abnormalities (uterine shape, uterine septum, fibroids, or bleeding from the placenta).  11. A cervix that has been operated on (it may fail to stay closed).  12. Malformations in the fetus.  13. Multiple gestations (twins, triplets, and so on).  14. Breakage of the amniotic sac.  RISK FACTORS 3. Having a previous history of preterm labor.  4. Having premature rupture of membranes (PROM).  5. Having a placenta that covers the opening of the cervix (placenta previa).  6. Having a placenta that separates from the uterus (placental abruption).  7. Having a cervix that is too weak to hold the fetus in the uterus (incompetent cervix).  8. Having too much fluid in the amniotic sac (polyhydramnios).  9. Taking illegal drugs or smoking while pregnant.  10. Not gaining enough weight while pregnant.  11. Being younger than 7318 and older than 30 years old.  12.  Having a low socioeconomic status.  13. Being African American. SYMPTOMS Signs and symptoms of preterm labor include:   Menstrual-like cramps, abdominal pain, or back pain.  Uterine contractions that are regular, as frequent as six in an hour, regardless of their intensity (may be mild or painful).  Contractions that start on the top of the uterus and spread down to the lower abdomen and back.   A sense of increased pelvic pressure.   A watery or bloody mucus discharge that comes from the vagina.  TREATMENT Depending on the length of the pregnancy and other circumstances, your health  care provider may suggest bed rest. If necessary, there are medicines that can be given to stop contractions and to mature the fetal lungs. If labor happens before 34 weeks of pregnancy, a prolonged hospital stay may be recommended. Treatment depends on the condition of both you and the fetus.  WHAT SHOULD YOU DO IF YOU THINK YOU ARE IN PRETERM LABOR? Call your health care provider right away. You will need to go to the hospital to get checked immediately. HOW CAN YOU PREVENT PRETERM LABOR IN FUTURE PREGNANCIES? You should:   Stop smoking if you smoke.  Maintain healthy weight gain and avoid chemicals and drugs that are not necessary.  Be watchful for any type of infection.  Inform your health care provider if you have a known history of preterm labor. Document Released: 10/22/2003 Document Revised: 04/03/2013 Document Reviewed: 09/03/2012 [facility] Patient Information 2015 [place], [place]. This information is not intended to replace advice given to you by your health care provider. Make sure you discuss any questions you have with your health care provider.

## 2015-01-21 NOTE — Progress Notes (Signed)
Low-risk OB appointment G1P0 7247w3d Estimated Date of Delivery: 03/15/15 BP 128/80 mmHg  Pulse 74  Wt 285 lb 6.4 oz (129.457 kg)  LMP 06/08/2014 (Approximate)  BP, weight, and urine reviewed.  Refer to obstetrical flow sheet for FH & FHR.  Reports good fm.  Denies regular uc's, lof, vb, or uti s/s. No complaints. Reviewed ptl s/s, fkc. Plan:  Continue routine obstetrical care  F/U in 2wks for OB appointment

## 2015-02-04 ENCOUNTER — Ambulatory Visit (INDEPENDENT_AMBULATORY_CARE_PROVIDER_SITE_OTHER): Payer: Medicaid Other | Admitting: Women's Health

## 2015-02-04 ENCOUNTER — Encounter: Payer: Self-pay | Admitting: Women's Health

## 2015-02-04 VITALS — BP 130/74 | HR 72 | Wt 287.0 lb

## 2015-02-04 DIAGNOSIS — Z1389 Encounter for screening for other disorder: Secondary | ICD-10-CM

## 2015-02-04 DIAGNOSIS — O99210 Obesity complicating pregnancy, unspecified trimester: Secondary | ICD-10-CM

## 2015-02-04 DIAGNOSIS — Z3403 Encounter for supervision of normal first pregnancy, third trimester: Secondary | ICD-10-CM

## 2015-02-04 DIAGNOSIS — Z331 Pregnant state, incidental: Secondary | ICD-10-CM

## 2015-02-04 DIAGNOSIS — O26843 Uterine size-date discrepancy, third trimester: Secondary | ICD-10-CM

## 2015-02-04 LAB — POCT URINALYSIS DIPSTICK
Glucose, UA: NEGATIVE
Ketones, UA: NEGATIVE
Leukocytes, UA: NEGATIVE
NITRITE UA: NEGATIVE
Protein, UA: NEGATIVE
RBC UA: NEGATIVE

## 2015-02-04 NOTE — Patient Instructions (Signed)
Call the office ([phone]) or go to [facility] if:  You begin to have strong, frequent contractions  Your water breaks.  Sometimes it is a big gush of fluid, sometimes it is just a trickle that keeps getting your panties wet or running down your legs  You have vaginal bleeding.  It is normal to have a small amount of spotting if your cervix was checked.   You don't feel your baby moving like normal.  If you don't, get you something to eat and drink and lay down and focus on feeling your baby move.  You should feel at least 10 movements in 2 hours.  If you don't, you should call the office or go to [facility].    Preterm Labor Information Preterm labor is when labor starts at less than 37 weeks of pregnancy. The normal length of a pregnancy is 39 to 41 weeks. CAUSES Often, there is no identifiable underlying cause as to why a woman goes into preterm labor. One of the most common known causes of preterm labor is infection. Infections of the uterus, cervix, vagina, amniotic sac, bladder, kidney, or even the lungs (pneumonia) can cause labor to start. Other suspected causes of preterm labor include:   Urogenital infections, such as yeast infections and bacterial vaginosis.   Uterine abnormalities (uterine shape, uterine septum, fibroids, or bleeding from the placenta).   A cervix that has been operated on (it may fail to stay closed).   Malformations in the fetus.   Multiple gestations (twins, triplets, and so on).   Breakage of the amniotic sac.  RISK FACTORS  Having a previous history of preterm labor.   Having premature rupture of membranes (PROM).   Having a placenta that covers the opening of the cervix (placenta previa).   Having a placenta that separates from the uterus (placental abruption).   Having a cervix that is too weak to hold the fetus in the uterus (incompetent cervix).   Having too much fluid in the amniotic sac (polyhydramnios).   Taking  illegal drugs or smoking while pregnant.   Not gaining enough weight while pregnant.   Being younger than 18 and older than 30 years old.   Having a low socioeconomic status.   Being African American. SYMPTOMS Signs and symptoms of preterm labor include:   Menstrual-like cramps, abdominal pain, or back pain.  Uterine contractions that are regular, as frequent as six in an hour, regardless of their intensity (may be mild or painful).  Contractions that start on the top of the uterus and spread down to the lower abdomen and back.   A sense of increased pelvic pressure.   A watery or bloody mucus discharge that comes from the vagina.  TREATMENT Depending on the length of the pregnancy and other circumstances, your health care provider may suggest bed rest. If necessary, there are medicines that can be given to stop contractions and to mature the fetal lungs. If labor happens before 34 weeks of pregnancy, a prolonged hospital stay may be recommended. Treatment depends on the condition of both you and the fetus.  WHAT SHOULD YOU DO IF YOU THINK YOU ARE IN PRETERM LABOR? Call your health care provider right away. You will need to go to the hospital to get checked immediately. HOW CAN YOU PREVENT PRETERM LABOR IN FUTURE PREGNANCIES? You should:   Stop smoking if you smoke.  Maintain healthy weight gain and avoid chemicals and drugs that are not necessary.  Be watchful for   any type of infection.  Inform your health care provider if you have a known history of preterm labor. Document Released: 10/22/2003 Document Revised: 04/03/2013 Document Reviewed: 09/03/2012 [employer] Patient Information 2015 [employer]. This information is not intended to replace advice given to you by your health care provider. Make sure you discuss any questions you have with your health care provider.  

## 2015-02-04 NOTE — Progress Notes (Signed)
Low-risk OB appointment G1P0 [redacted]w[redacted]d Estimated Date of Delivery: 03/15/15 BP 130/74 mmHg  Pulse 72  Wt 287 lb (130.182 kg)  LMP 06/08/2014 (Approximate)  BP, weight, and urine reviewed.  Refer to obstetrical flow sheet for FH & FHR. FH measured from U, FH from SP 41cm Reports good fm.  Denies regular uc's, lof, vb, or uti s/s. Concerned b/c fob also had another woman pregnant that lost her baby- and that woman told her the cord detached from placenta & cord was wrapped around neck and those things were genetic. Reassured pt those are absolutely not genetic.  Reviewed ptl s/s, fkc. Plan:  Continue routine obstetrical care  F/U in 2wks for OB appointment and efw/afi u/d d/t obesity/unable to get good idea of growth w/ FH

## 2015-02-18 ENCOUNTER — Ambulatory Visit (INDEPENDENT_AMBULATORY_CARE_PROVIDER_SITE_OTHER): Payer: Medicaid Other

## 2015-02-18 ENCOUNTER — Ambulatory Visit (INDEPENDENT_AMBULATORY_CARE_PROVIDER_SITE_OTHER): Payer: Medicaid Other | Admitting: Obstetrics and Gynecology

## 2015-02-18 ENCOUNTER — Encounter: Payer: Self-pay | Admitting: Obstetrics and Gynecology

## 2015-02-18 VITALS — BP 108/70 | HR 84 | Wt 289.0 lb

## 2015-02-18 DIAGNOSIS — Z1389 Encounter for screening for other disorder: Secondary | ICD-10-CM

## 2015-02-18 DIAGNOSIS — Z369 Encounter for antenatal screening, unspecified: Secondary | ICD-10-CM

## 2015-02-18 DIAGNOSIS — Z3403 Encounter for supervision of normal first pregnancy, third trimester: Secondary | ICD-10-CM

## 2015-02-18 DIAGNOSIS — O99210 Obesity complicating pregnancy, unspecified trimester: Secondary | ICD-10-CM

## 2015-02-18 DIAGNOSIS — O26843 Uterine size-date discrepancy, third trimester: Secondary | ICD-10-CM

## 2015-02-18 DIAGNOSIS — E669 Obesity, unspecified: Secondary | ICD-10-CM

## 2015-02-18 DIAGNOSIS — Z331 Pregnant state, incidental: Secondary | ICD-10-CM

## 2015-02-18 LAB — POCT URINALYSIS DIPSTICK
Glucose, UA: NEGATIVE
KETONES UA: NEGATIVE
Leukocytes, UA: NEGATIVE
Nitrite, UA: NEGATIVE
Protein, UA: NEGATIVE
RBC UA: NEGATIVE

## 2015-02-18 NOTE — Progress Notes (Signed)
Pt wants to discuss coming out of work with Dr. Emelda FearFerguson. Pt states that "they are working me too much".

## 2015-02-18 NOTE — Progress Notes (Signed)
G1P0 3331w3d Estimated Date of Delivery: 03/15/15  Blood pressure 108/70, pulse 84, weight 289 lb (131.09 kg), last menstrual period 06/08/2014.   refer to the ob flow sheet for FH (U+23/41cm) and FHR (143), also BP, Wt, Urine results:negative  Patient reports good fetal movement, denies any bleeding and no rupture of membranes symptoms or regular contractions. Patient complaints:desiring maternity leave due to discomforts of pregnancy. 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   Questions were answered. Assessment:  Plan:  Continued routine obstetrical care            Out of work note given F/u in 1 week for routine prenatal visit   This chart was scribed by Leone PayorSonum Patel, Medical Scribe, for Dr. Christin BachJohn Zorina Mallin on 02/18/15 at 9:16 AM. This chart was reviewed by Dr. Christin BachJohn Kenzly Rogoff for accuracy.

## 2015-02-18 NOTE — Progress Notes (Addendum)
US 36+3wks,measurements c/w dates, efw 3030g 57.2%,post pl gr 1,afi 11cm,fht 155bpm,bilat adnexa wnl,cephalic,limited view because of pt body habitus.

## 2015-02-20 LAB — GC/CHLAMYDIA PROBE AMP
Chlamydia trachomatis, NAA: NEGATIVE
NEISSERIA GONORRHOEAE BY PCR: NEGATIVE

## 2015-02-23 LAB — OB RESULTS CONSOLE GC/CHLAMYDIA
Chlamydia: NEGATIVE
Gonorrhea: NEGATIVE

## 2015-02-23 LAB — STREP GP B NAA+RFLX: Strep Gp B NAA+Rflx: POSITIVE — AB

## 2015-02-23 LAB — STREP GP B SUSCEPTIBILITY

## 2015-02-23 LAB — OB RESULTS CONSOLE GBS: STREP GROUP B AG: POSITIVE

## 2015-02-24 ENCOUNTER — Inpatient Hospital Stay (HOSPITAL_COMMUNITY): Payer: Medicaid Other

## 2015-02-24 ENCOUNTER — Encounter: Payer: Medicaid Other | Admitting: Advanced Practice Midwife

## 2015-02-24 ENCOUNTER — Inpatient Hospital Stay (HOSPITAL_COMMUNITY)
Admission: AD | Admit: 2015-02-24 | Discharge: 2015-02-28 | DRG: 775 | Disposition: A | Discharge status: Home / Self Care | Payer: Medicaid Other | Source: Ambulatory Visit | Attending: Obstetrics & Gynecology | Admitting: Obstetrics & Gynecology

## 2015-02-24 ENCOUNTER — Encounter (HOSPITAL_COMMUNITY): Payer: Self-pay | Admitting: *Deleted

## 2015-02-24 ENCOUNTER — Telehealth: Payer: Self-pay | Admitting: *Deleted

## 2015-02-24 DIAGNOSIS — J45909 Unspecified asthma, uncomplicated: Secondary | ICD-10-CM | POA: Diagnosis present

## 2015-02-24 DIAGNOSIS — Z6841 Body Mass Index (BMI) 40.0 and over, adult: Secondary | ICD-10-CM

## 2015-02-24 DIAGNOSIS — O99214 Obesity complicating childbirth: Secondary | ICD-10-CM | POA: Diagnosis present

## 2015-02-24 DIAGNOSIS — Z87891 Personal history of nicotine dependence: Secondary | ICD-10-CM | POA: Diagnosis not present

## 2015-02-24 DIAGNOSIS — Z3400 Encounter for supervision of normal first pregnancy, unspecified trimester: Secondary | ICD-10-CM

## 2015-02-24 DIAGNOSIS — O9952 Diseases of the respiratory system complicating childbirth: Secondary | ICD-10-CM | POA: Diagnosis present

## 2015-02-24 DIAGNOSIS — Z881 Allergy status to other antibiotic agents status: Secondary | ICD-10-CM

## 2015-02-24 DIAGNOSIS — O99824 Streptococcus B carrier state complicating childbirth: Secondary | ICD-10-CM

## 2015-02-24 DIAGNOSIS — O42013 Preterm premature rupture of membranes, onset of labor within 24 hours of rupture, third trimester: Secondary | ICD-10-CM | POA: Diagnosis present

## 2015-02-24 DIAGNOSIS — O99519 Diseases of the respiratory system complicating pregnancy, unspecified trimester: Secondary | ICD-10-CM

## 2015-02-24 DIAGNOSIS — Z23 Encounter for immunization: Secondary | ICD-10-CM

## 2015-02-24 DIAGNOSIS — Z3403 Encounter for supervision of normal first pregnancy, third trimester: Secondary | ICD-10-CM

## 2015-02-24 DIAGNOSIS — Z3A37 37 weeks gestation of pregnancy: Secondary | ICD-10-CM

## 2015-02-24 DIAGNOSIS — O288 Other abnormal findings on antenatal screening of mother: Secondary | ICD-10-CM

## 2015-02-24 DIAGNOSIS — Z880 Allergy status to penicillin: Secondary | ICD-10-CM | POA: Diagnosis not present

## 2015-02-24 DIAGNOSIS — O4292 Full-term premature rupture of membranes, unspecified as to length of time between rupture and onset of labor: Principal | ICD-10-CM | POA: Diagnosis present

## 2015-02-24 LAB — CBC
HCT: 37.9 % (ref 36.0–46.0)
HEMOGLOBIN: 13 g/dL (ref 12.0–15.0)
MCH: 30.6 pg (ref 26.0–34.0)
MCHC: 34.3 g/dL (ref 30.0–36.0)
MCV: 89.2 fL (ref 78.0–100.0)
PLATELETS: 274 10*3/uL (ref 150–400)
RBC: 4.25 MIL/uL (ref 3.87–5.11)
RDW: 14 % (ref 11.5–15.5)
WBC: 10.7 10*3/uL — ABNORMAL HIGH (ref 4.0–10.5)

## 2015-02-24 LAB — TYPE AND SCREEN
ABO/RH(D): O POS
Antibody Screen: NEGATIVE

## 2015-02-24 LAB — ABO/RH: ABO/RH(D): O POS

## 2015-02-24 LAB — POCT FERN TEST: POCT FERN TEST: POSITIVE

## 2015-02-24 MED ORDER — LACTATED RINGERS IV SOLN
INTRAVENOUS | Status: DC
  Administered 2015-02-24 – 2015-02-25 (×4): via INTRAVENOUS
  Administered 2015-02-26: 125 mL/h via INTRAVENOUS

## 2015-02-24 MED ORDER — PENICILLIN G POTASSIUM 5000000 UNITS IJ SOLR: 5.0000 10*6.[IU] | Freq: Once | INTRAVENOUS | Status: DC

## 2015-02-24 MED ORDER — PENICILLIN G POTASSIUM 5000000 UNITS IJ SOLR: 2.5000 10*6.[IU] | INTRAVENOUS | Status: DC

## 2015-02-24 MED ORDER — ONDANSETRON HCL 4 MG/2ML IJ SOLN: 4.0000 mg | Freq: Four times a day (QID) | INTRAMUSCULAR | Status: DC | PRN

## 2015-02-24 MED ORDER — OXYCODONE-ACETAMINOPHEN 5-325 MG PO TABS: 2.0000 | ORAL_TABLET | ORAL | Status: DC | PRN

## 2015-02-24 MED ORDER — TERBUTALINE SULFATE 1 MG/ML IJ SOLN: 0.2500 mg | Freq: Once | INTRAMUSCULAR | Status: AC | PRN

## 2015-02-24 MED ORDER — EPHEDRINE 5 MG/ML INJ: 10.0000 mg | INTRAVENOUS | Status: DC | PRN

## 2015-02-24 MED ORDER — PHENYLEPHRINE 40 MCG/ML (10ML) SYRINGE FOR IV PUSH (FOR BLOOD PRESSURE SUPPORT): 80.0000 ug | PREFILLED_SYRINGE | INTRAVENOUS | Status: DC | PRN

## 2015-02-24 MED ORDER — VANCOMYCIN HCL IN DEXTROSE 1-5 GM/200ML-% IV SOLN
1000.0000 mg | Freq: Two times a day (BID) | INTRAVENOUS | Status: DC
  Administered 2015-02-24 – 2015-02-26 (×4): 1000 mg via INTRAVENOUS
  Filled 2015-02-24 (×6): qty 200

## 2015-02-24 MED ORDER — LACTATED RINGERS IV SOLN: 500.0000 mL | INTRAVENOUS | Status: DC | PRN

## 2015-02-24 MED ORDER — CITRIC ACID-SODIUM CITRATE 334-500 MG/5ML PO SOLN: 30.0000 mL | ORAL | Status: DC | PRN

## 2015-02-24 MED ORDER — FENTANYL 2.5 MCG/ML BUPIVACAINE 1/10 % EPIDURAL INFUSION (WH - ANES)
14.0000 mL/h | INTRAMUSCULAR | Status: DC | PRN
  Administered 2015-02-25 – 2015-02-26 (×5): 14 mL/h via EPIDURAL
  Filled 2015-02-24 (×3): qty 125

## 2015-02-24 MED ORDER — OXYTOCIN 40 UNITS IN LACTATED RINGERS INFUSION - SIMPLE MED
62.5000 mL/h | INTRAVENOUS | Status: DC
  Administered 2015-02-26: 999 mL/h via INTRAVENOUS
  Filled 2015-02-24 (×2): qty 1000

## 2015-02-24 MED ORDER — OXYTOCIN 40 UNITS IN LACTATED RINGERS INFUSION - SIMPLE MED
1.0000 m[IU]/min | INTRAVENOUS | Status: DC
  Administered 2015-02-24: 2 m[IU]/min via INTRAVENOUS

## 2015-02-24 MED ORDER — ALBUTEROL SULFATE (2.5 MG/3ML) 0.083% IN NEBU: 3.0000 mL | INHALATION_SOLUTION | Freq: Four times a day (QID) | RESPIRATORY_TRACT | Status: DC | PRN

## 2015-02-24 MED ORDER — FENTANYL CITRATE (PF) 100 MCG/2ML IJ SOLN
50.0000 ug | INTRAMUSCULAR | Status: DC | PRN
  Administered 2015-02-25 (×2): 100 ug via INTRAVENOUS
  Filled 2015-02-24 (×2): qty 2

## 2015-02-24 MED ORDER — DIPHENHYDRAMINE HCL 50 MG/ML IJ SOLN
12.5000 mg | INTRAMUSCULAR | Status: DC | PRN
  Administered 2015-02-26 (×2): 12.5 mg via INTRAVENOUS
  Filled 2015-02-24: qty 1

## 2015-02-24 MED ORDER — ACETAMINOPHEN 325 MG PO TABS: 650.0000 mg | ORAL_TABLET | ORAL | Status: DC | PRN

## 2015-02-24 MED ORDER — MISOPROSTOL 50MCG HALF TABLET
50.0000 ug | ORAL_TABLET | ORAL | Status: DC | PRN
  Administered 2015-02-24: 50 ug via ORAL
  Filled 2015-02-24: qty 0.5

## 2015-02-24 MED ORDER — LIDOCAINE HCL (PF) 1 % IJ SOLN
30.0000 mL | INTRAMUSCULAR | Status: AC | PRN
  Administered 2015-02-26: 30 mL via SUBCUTANEOUS
  Filled 2015-02-24 (×2): qty 30

## 2015-02-24 MED ORDER — OXYCODONE-ACETAMINOPHEN 5-325 MG PO TABS: 1.0000 | ORAL_TABLET | ORAL | Status: DC | PRN

## 2015-02-24 MED ORDER — OXYTOCIN BOLUS FROM INFUSION: 500.0000 mL | INTRAVENOUS | Status: DC

## 2015-02-24 NOTE — H&P (Signed)
LABOR ADMISSION HISTORY AND PHYSICAL  Dorothy Flowers is a 29 y.o. female G1P0 with IUP at [redacted]w[redacted]d by L/7 scan presenting for PROM. She reports +FMs, No LOF, no VB, no blurry vision, headaches or peripheral edema, and RUQ pain.  She plans on formula feeding. She plans to use abstinence for birth control  Dating: By L/7 --->  Estimated Date of Delivery: 03/15/15  Sono:  Last on 7/7, 36+5  3030g, 57% EFW  She also reports decreased FM today.    Prenatal History/Complications:  Past Medical History: Past Medical History  Diagnosis Date  . Asthma     as child    Past Surgical History: Past Surgical History  Procedure Laterality Date  . Wisdom tooth extraction      Obstetrical History: OB History    Gravida Para Term Preterm AB TAB SAB Ectopic Multiple Living   1               Social History: History   Social History  . Marital Status: Single    Spouse Name: N/A  . Number of Children: N/A  . Years of Education: N/A   Social History Main Topics  . Smoking status: Former Smoker -- 0.00 packs/day  . Smokeless tobacco: Never Used     Comment: quit with + preg  . Alcohol Use: No  . Drug Use: No  . Sexual Activity: Not Currently    Birth Control/ Protection: None   Other Topics Concern  . None   Social History Narrative    Family History: Family History  Problem Relation Age of Onset  . Hypertension Mother   . Stroke Mother   . Diabetes Maternal Aunt   . Diabetes Maternal Grandmother   . Heart disease Father   . Hypertension Father   . Mental illness Paternal Uncle   . COPD Paternal Grandmother     smokes  . Cancer Paternal Grandfather   . Heart disease Paternal Grandfather     Allergies: Allergies  Allergen Reactions  . Keflex [Cephalexin] Shortness Of Breath and Rash  . Cephalosporins Swelling and Other (See Comments)    Pain    Prescriptions prior to admission  Medication Sig Dispense Refill Last Dose  . Prenatal Multivit-Min-Fe-FA  (PRENATAL VITAMINS) 0.8 MG tablet Take 1 tablet by mouth daily. 30 tablet 12 02/23/2015 at Unknown time  . albuterol (PROVENTIL HFA;VENTOLIN HFA) 108 (90 BASE) MCG/ACT inhaler Inhale 1-2 puffs into the lungs every 6 (six) hours as needed for wheezing or shortness of breath. 1 Inhaler 1 Taking     Review of Systems   All systems reviewed and negative except as stated in HPI  Blood pressure 130/73, pulse 77, temperature 98.5 F (36.9 C), temperature source Oral, resp. rate 20, height  (1.6 m), weight 290 lb (131.543 kg), last menstrual period 06/08/2014.   Body mass index is 51.38 kg/(m^2).  General appearance: alert, cooperative, no distress and morbidly obese Lungs: clear to auscultation bilaterally Heart: regular rate and rhythm Abdomen: soft, non-tender; bowel sounds normal Pelvic: Closed appearing on speculum exam.  Extremities: Homans sign is negative, no sign of DVT Presentation: cephalic Fetal monitoringBaseline: 135 bpm Uterine activityNone Dilation: 1 Effacement (%): 70 Station: -3 Exam by:: Dr Alvester Morin   Prenatal labs: ABO, Rh: --/--/O POS (07/12 1430) Antibody: NEG (07/12 1430) Rubella:   RPR: Non Reactive (05/05 0907)  HBsAg: NEGATIVE (12/22 1025)  HIV: NONREACTIVE (12/22 1025)  GBS: Positive (07/11 0000)  3hr 83/162/123 Genetic screening  Neg Anatomy US wnl, female  Prenatal Transfer Tool  Maternal Diabetes: No Genetic Screening: Normal Maternal Ultrasounds/Referrals: Normal Fetal Ultrasounds or other Referrals:  None Maternal Substance Abuse:  No Significant Maternal Medications:  None Significant Maternal Lab Results: None  Results for orders placed or performed during the hospital encounter of 02/24/15 (from the past 24 hour(s))  Fern Test   Collection Time: 02/24/15  1:50 PM  Result Value Ref Range   POCT Fern Test Positive = ruptured amniotic membanes   CBC   Collection Time: 02/24/15  2:30 PM  Result Value Ref Range   WBC 10.7 (H) 4.0 -  10.5 K/uL   RBC 4.25 3.87 - 5.11 MIL/uL   Hemoglobin 13.0 12.0 - 15.0 g/dL   HCT 16.137.9 09.636.0 - 04.546.0 %   MCV 89.2 78.0 - 100.0 fL   MCH 30.6 26.0 - 34.0 pg   MCHC 34.3 30.0 - 36.0 g/dL   RDW 40.914.0 81.111.5 - 91.415.5 %   Platelets 274 150 - 400 K/uL  Type and screen   Collection Time: 02/24/15  2:30 PM  Result Value Ref Range   ABO/RH(D) O POS    Antibody Screen NEG    Sample Expiration 02/27/2015     Patient Active Problem List   Diagnosis Date Noted  . Indication for care or intervention in labor or delivery 02/24/2015  . Asthma affecting pregnancy, antepartum 02/24/2015  . Morbid obesity with BMI of 50.0-59.9, adult 02/24/2015  . Preterm premature rupture of membranes with onset of labor within 24 hours of rupture in third trimester   . Supervision of normal first pregnancy 08/05/2014  . OBESITY, NOS 10/12/2006  . ASTHMA, UNSPECIFIED 10/12/2006  . ACNE 10/12/2006    Assessment: Dorothy Flowers is a 29 y.o. G1P0 at 1329w2d here for PROM at term without onset of labor  #Labor: expectantly managed for > 6 hours and will augment/induce with FB and Cytotec #Pain: Epidural  #FWB: Cat I, BPP 6/8 (2 off for fetal breathing), AFI 5.54, MVP 2.09 #ID:  GBS POS with PCN allergy and clinda resistant species #MOF: formula #MOC: abstience #Circ:  desires  Dorothy FlakeKimberly Niles Braelin Brosch MD 02/24/2015, 8:21 PM

## 2015-02-24 NOTE — Telephone Encounter (Signed)
Pt Mother, Junious DresserConnie, states pt has been leaking a watery discharge since yesterday, and c/o decrease FM. Advised pt Mom, Junious DresserConnie pt needed to be seen, [place] and could be here by 1:30 pm. Pt added to Rodena PietyFran Cresenzo, CNM for evaluation.

## 2015-02-24 NOTE — MAU Note (Signed)
Woke up this morning, having leaking.  Clear fluid has continued, not pee- can't control it. Some mucous noted with it..  Baby not moving also... Some movement this morning, but none really since

## 2015-02-24 NOTE — Progress Notes (Signed)
Patient ID: Dorothy HammedJennifer L Jaffee, female   DOB: [birth year], 29 y.o.   MRN: [id number] Doing well, appears  Comfortable  Filed Vitals:   02/24/15 1504 02/24/15 1717 02/24/15 1930 02/24/15 2130  BP: 130/73   114/72  Pulse: 77   72  Temp: 98.7 F (37.1 C) 98.5 F (36.9 C) 98.7 F (37.1 C) 98.9 F (37.2 C)  TempSrc: Oral Oral Oral   Resp: 20   20  Height:      Weight:        FHR reactive, baseline 140s, + accels  UCs every 4-5 minutes  Dilation: 1 Effacement (%): 70 Cervical Position: Middle Station: -3 Presentation: Vertex Exam by:: Dr Alvester MorinNewton  Will start Pitocin after she eats.

## 2015-02-24 NOTE — MAU Note (Signed)
BS unable to triage at this time.

## 2015-02-25 ENCOUNTER — Encounter (HOSPITAL_COMMUNITY): Payer: Self-pay | Admitting: Anesthesiology

## 2015-02-25 ENCOUNTER — Inpatient Hospital Stay (HOSPITAL_COMMUNITY): Payer: Medicaid Other | Admitting: Anesthesiology

## 2015-02-25 MED ORDER — FENTANYL 2.5 MCG/ML BUPIVACAINE 1/10 % EPIDURAL INFUSION (WH - ANES)
INTRAMUSCULAR | Status: AC
  Administered 2015-02-25: 14 mL/h via EPIDURAL
  Filled 2015-02-25: qty 125

## 2015-02-25 MED ORDER — PROMETHAZINE HCL 25 MG/ML IJ SOLN: 12.5000 mg | Freq: Four times a day (QID) | INTRAMUSCULAR | Status: DC | PRN

## 2015-02-25 MED ORDER — LIDOCAINE HCL (PF) 1 % IJ SOLN
INTRAMUSCULAR | Status: DC | PRN
  Administered 2015-02-25 (×2): 9 mL

## 2015-02-25 MED ORDER — MORPHINE SULFATE 4 MG/ML IJ SOLN
5.0000 mg | Freq: Once | INTRAMUSCULAR | Status: AC
  Administered 2015-02-25: 5 mg via INTRAVENOUS
  Filled 2015-02-25: qty 2

## 2015-02-25 MED ORDER — PROMETHAZINE HCL 25 MG/ML IJ SOLN
12.5000 mg | Freq: Four times a day (QID) | INTRAMUSCULAR | Status: DC | PRN
  Administered 2015-02-25: 12.5 mg via INTRAVENOUS
  Filled 2015-02-25: qty 1

## 2015-02-25 MED ORDER — PHENYLEPHRINE 40 MCG/ML (10ML) SYRINGE FOR IV PUSH (FOR BLOOD PRESSURE SUPPORT)
PREFILLED_SYRINGE | INTRAVENOUS | Status: AC
  Filled 2015-02-25: qty 20

## 2015-02-25 MED ORDER — MORPHINE SULFATE 4 MG/ML IJ SOLN
5.0000 mg | Freq: Once | INTRAMUSCULAR | Status: AC
  Administered 2015-02-25: 5 mg via SUBCUTANEOUS
  Filled 2015-02-25: qty 2

## 2015-02-25 NOTE — Anesthesia Procedure Notes (Signed)
Epidural Patient location during procedure: OB Start time: 02/25/2015 2:21 PM End time: 02/25/2015 2:25 PM  Staffing Anesthesiologist: Leilani AbleHATCHETT, Shamon Lobo Performed by: anesthesiologist   Preanesthetic Checklist Completed: patient identified, surgical consent, pre-op evaluation, timeout performed, IV checked, risks and benefits discussed and monitors and equipment checked  Epidural Patient position: sitting Prep: site prepped and draped and DuraPrep Patient monitoring: continuous pulse ox and blood pressure Approach: midline Location: L3-L4 Injection technique: LOR air  Needle:  Needle type: Tuohy  Needle gauge: 17 G Needle length: 9 cm and 9 Needle insertion depth: 7 cm Catheter type: closed end flexible Catheter size: 19 Gauge Catheter at skin depth: 12 cm Test dose: negative and Other  Assessment Sensory level: T9 Events: blood not aspirated, injection not painful, no injection resistance, negative IV test and no paresthesia

## 2015-02-25 NOTE — Progress Notes (Signed)
Patient ID: Dorothy Flowers, female   DOB: [birth year], [age] y.o.   MRN: [id number] Doing well  Good progress on labor curve  Filed Vitals:   02/25/15 0131 02/25/15 0201 02/25/15 0232 02/25/15 0301  BP: 116/64 131/75 134/76 137/69  Pulse: 66 71 60 66  Temp:      TempSrc:      Resp:      Height:      Weight:       FHR reactive UCs every 2-3 min  Dilation: 4 Effacement (%): 80 Cervical Position: Posterior Station: -2 Presentation: Vertex Exam by:: Lanice ShirtsV Rogers RN   Will continue to observe

## 2015-02-25 NOTE — Progress Notes (Signed)
Patient ID: Dorothy Flowers, female   DOB: 05-Dec-1985, 29 y.o.   MRN: [id number] Doing well, appears  Comfortable  Filed Vitals:   02/24/15 2331 02/25/15 0002 02/25/15 0030 02/25/15 0031  BP: 135/81 108/61 126/81 126/81  Pulse: 67 77 72 72  Temp:      TempSrc:      Resp:      Height:      Weight:        FHR reactive, baseline 140s, + accels  UCs every 2-5 minutes  Dilation: 2 Effacement (%): 70 Cervical Position: Posterior Station: -3 Presentation: Vertex Exam by:: Ace GinsL. Cresenzo, RN  Continue to increase Pitocin  [name], MD, MPH PGY-2,  [facility] 02/25/2015 1:28 AM

## 2015-02-25 NOTE — Progress Notes (Signed)
Labor Progress Note  S: very uncomfortable with contractions  O:  BP 130/75 mmHg  Pulse 70  Temp(Src) 97.9 F (36.6 C) (Oral)  Resp 20  Ht 5\' 3"  (1.6 m)  Wt 290 lb (131.543 kg)  BMI 51.38 kg/m2  SpO2 100%  LMP 06/08/2014 (Approximate) Cat I, difficult to trace contractions which are q1-292min then q644min CVE: 4/80/-2 with significant adipose tissue in pelvis  A&P: 29 y.o. G1P0 4892w3d augmentation of labor 2/2 Premature Prolonged ROM 0500 yesterday #labor: progressing very slowly, on pitocin since 2240.  IUPC and FSE placed.   #pain: declines epidural, morphine 5mg  IV and 5mg  subcu, phenergan 12.5IV for therapeutic rest.  If no change advised patient an epidural would beneficial for rest.  Dorothy MountACOSTA,Dorothy Setzer ROCIO, MD 10:22 AM

## 2015-02-25 NOTE — Progress Notes (Signed)
Dorothy Flowers is a 29 y.o. G1P0 at 3855w3d by ultrasound admitted for induction of labor due to ROM., rupture of membranes pt has morbid obesity, and lax abdominal wall  Subjective:Pt has moderate discomfort in left leg, with the right leg very anesthetized.    Objective: BP 132/65 mmHg  Pulse 75  Temp(Src) 98.5 F (36.9 C) (Oral)  Resp 18  Ht 5\' 3"  (1.6 m)  Wt 290 lb (131.543 kg)  BMI 51.38 kg/m2  SpO2 100%  LMP 06/08/2014 (Approximate) I/O last 3 completed shifts: In: -  Out: 1000 [Urine:1000]   Abdomen, uterus is rotated to the left, and by ultrasound the fetal spine is on the right side of the uterus, with fetal small parts far to the left of midline due to the left-sided rotation of the uterine fundus FHT:  FHR: 145 bpm, variability: moderate,  accelerations:  Present,  decelerations:  Absent UC:   regular, every 3-4 minutes SVE:   Dilation: 5 Effacement (%): 70 Station: -2 Exam by:: Dr Emelda FearFerguson the frontal suture is palpable in middle of the cervix, vertex presenting part.  Labs: Lab Results  Component Value Date   WBC 10.7* 02/24/2015   HGB 13.0 02/24/2015   HCT 37.9 02/24/2015   MCV 89.2 02/24/2015   PLT 274 02/24/2015    Assessment / Plan: Induction of labor due to PROM,  progressing well on pitocin  Labor: latent phase, with IUPc IN PLACE Preeclampsia:   Fetal Wellbeing:  Category I Pain Control:  Epidural I/D:  n/a Anticipated MOD:  uncertain: pt has abdominal laxity and uterine rotation working against us. Plan is to boost the epidural with pt on her left side, then have her spend most of labor on the right side to try to align the baby with the maternal midline. Dorothy Flowers V 02/25/2015, 9:34 PM

## 2015-02-25 NOTE — Anesthesia Preprocedure Evaluation (Signed)
Anesthesia Evaluation  Patient identified by MRN, date of birth, ID band Patient awake    Reviewed: Allergy & Precautions, H&P , NPO status , Patient's Chart, lab work & pertinent test results  Airway Mallampati: III  TM Distance: >3 FB Neck ROM: full    Dental no notable dental hx.    Pulmonary former smoker,    Pulmonary exam normal       Cardiovascular negative cardio ROS Normal cardiovascular exam    Neuro/Psych negative neurological ROS  negative psych ROS   GI/Hepatic negative GI ROS, Neg liver ROS,   Endo/Other  Morbid obesity  Renal/GU negative Renal ROS     Musculoskeletal   Abdominal (+) + obese,   Peds  Hematology negative hematology ROS (+)   Anesthesia Other Findings   Reproductive/Obstetrics (+) Pregnancy                             Anesthesia Physical Anesthesia Plan  ASA: III  Anesthesia Plan: Epidural   Post-op Pain Management:    Induction:   Airway Management Planned:   Additional Equipment:   Intra-op Plan:   Post-operative Plan:   Informed Consent: I have reviewed the patients History and Physical, chart, labs and discussed the procedure including the risks, benefits and alternatives for the proposed anesthesia with the patient or authorized representative who has indicated his/her understanding and acceptance.     Plan Discussed with:   Anesthesia Plan Comments:         Anesthesia Quick Evaluation

## 2015-02-26 ENCOUNTER — Encounter (HOSPITAL_COMMUNITY): Payer: Self-pay | Admitting: *Deleted

## 2015-02-26 DIAGNOSIS — O4292 Full-term premature rupture of membranes, unspecified as to length of time between rupture and onset of labor: Secondary | ICD-10-CM

## 2015-02-26 DIAGNOSIS — O99214 Obesity complicating childbirth: Secondary | ICD-10-CM

## 2015-02-26 DIAGNOSIS — O99824 Streptococcus B carrier state complicating childbirth: Secondary | ICD-10-CM

## 2015-02-26 DIAGNOSIS — Z3A37 37 weeks gestation of pregnancy: Secondary | ICD-10-CM

## 2015-02-26 LAB — RPR: RPR: NONREACTIVE

## 2015-02-26 MED ORDER — ONDANSETRON HCL 4 MG PO TABS: 4.0000 mg | ORAL_TABLET | ORAL | Status: DC | PRN

## 2015-02-26 MED ORDER — SIMETHICONE 80 MG PO CHEW: 80.0000 mg | CHEWABLE_TABLET | ORAL | Status: DC | PRN

## 2015-02-26 MED ORDER — WITCH HAZEL-GLYCERIN EX PADS: 1.0000 "application " | MEDICATED_PAD | CUTANEOUS | Status: DC | PRN

## 2015-02-26 MED ORDER — DIBUCAINE 1 % RE OINT: 1.0000 "application " | TOPICAL_OINTMENT | RECTAL | Status: DC | PRN

## 2015-02-26 MED ORDER — DIPHENHYDRAMINE HCL 25 MG PO CAPS
25.0000 mg | ORAL_CAPSULE | Freq: Four times a day (QID) | ORAL | Status: DC | PRN
Start: 2015-02-26 — End: 2015-02-28

## 2015-02-26 MED ORDER — ZOLPIDEM TARTRATE 5 MG PO TABS
5.0000 mg | ORAL_TABLET | Freq: Every evening | ORAL | Status: DC | PRN
Start: 2015-02-26 — End: 2015-02-28

## 2015-02-26 MED ORDER — TETANUS-DIPHTH-ACELL PERTUSSIS 5-2.5-18.5 LF-MCG/0.5 IM SUSP
0.5000 mL | Freq: Once | INTRAMUSCULAR | Status: DC
  Filled 2015-02-26: qty 0.5

## 2015-02-26 MED ORDER — ACETAMINOPHEN 325 MG PO TABS: 650.0000 mg | ORAL_TABLET | ORAL | Status: DC | PRN

## 2015-02-26 MED ORDER — LANOLIN HYDROUS EX OINT: TOPICAL_OINTMENT | CUTANEOUS | Status: DC | PRN

## 2015-02-26 MED ORDER — OXYCODONE-ACETAMINOPHEN 5-325 MG PO TABS
1.0000 | ORAL_TABLET | ORAL | Status: DC | PRN
  Administered 2015-02-26: 1 via ORAL
  Administered 2015-02-27: 2 via ORAL
  Filled 2015-02-26: qty 2
  Filled 2015-02-26: qty 1

## 2015-02-26 MED ORDER — SENNOSIDES-DOCUSATE SODIUM 8.6-50 MG PO TABS
2.0000 | ORAL_TABLET | ORAL | Status: DC
  Administered 2015-02-26 – 2015-02-27 (×2): 2 via ORAL
  Filled 2015-02-26 (×2): qty 2

## 2015-02-26 MED ORDER — ONDANSETRON HCL 4 MG/2ML IJ SOLN: 4.0000 mg | INTRAMUSCULAR | Status: DC | PRN

## 2015-02-26 MED ORDER — IBUPROFEN 600 MG PO TABS
600.0000 mg | ORAL_TABLET | Freq: Four times a day (QID) | ORAL | Status: DC
  Administered 2015-02-26 – 2015-02-28 (×8): 600 mg via ORAL
  Filled 2015-02-26 (×8): qty 1

## 2015-02-26 MED ORDER — PRENATAL MULTIVITAMIN CH
1.0000 | ORAL_TABLET | Freq: Every day | ORAL | Status: DC
  Administered 2015-02-26 – 2015-02-28 (×3): 1 via ORAL
  Filled 2015-02-26 (×3): qty 1

## 2015-02-26 MED ORDER — PNEUMOCOCCAL VAC POLYVALENT 25 MCG/0.5ML IJ INJ
0.5000 mL | INJECTION | INTRAMUSCULAR | Status: AC
  Administered 2015-02-27: 0.5 mL via INTRAMUSCULAR
  Filled 2015-02-26: qty 0.5

## 2015-02-26 MED ORDER — BENZOCAINE-MENTHOL 20-0.5 % EX AERO
1.0000 "application " | INHALATION_SPRAY | CUTANEOUS | Status: DC | PRN
  Filled 2015-02-26: qty 56

## 2015-02-26 NOTE — Progress Notes (Signed)
Dorothy HammedJennifer L Wyeth is a 29 y.o. G1P0 at 463w4d admitted for induction of labor due to Spontaneous rupture of BOW. Pt is morbidly obese with BMI 51 Subjective: Pt comfortable, notes slight increased perineal pressure. Objective: BP 103/56 mmHg  Pulse 68  Temp(Src) 98.2 F (36.8 C) (Oral)  Resp 18  Ht 5\' 3"  (1.6 m)  Wt 131.543 kg (290 lb)  BMI 51.38 kg/m2  SpO2 100%  LMP 06/08/2014 (Approximate) I/O last 3 completed shifts: In: -  Out: 1000 [Urine:1000] Total I/O In: -  Out: 2400 [Urine:2400]  FHT:  FHR: 150 bpm, variability: moderate,  accelerations:  Present,  decelerations:  Absent has had only a couple of variable decels with prompt recovery, no lates UC:   regular, every 3 minutes SVE:   Dilation: 7 Effacement (%): 80 Station: -1 Exam by:: Dr Emelda FearFerguson Exam now at 5:15 am : 8/100/0 with good downward thrust with contraction. Moulding of vertex present Labs: Lab Results  Component Value Date   WBC 10.7* 02/24/2015   HGB 13.0 02/24/2015   HCT 37.9 02/24/2015   MCV 89.2 02/24/2015   PLT 274 02/24/2015    Assessment / Plan: Induction of labor due to PROM,  progressing well on pitocin  Labor: progressing adequately Preeclampsia:   Fetal Wellbeing:  Category I Pain Control:  Epidural I/D:  n/a Anticipated MOD:  NSVD  Lamae Fosco V 02/26/2015, 5:15 AM

## 2015-02-26 NOTE — Progress Notes (Signed)
Fredda HammedJennifer L Germany is a 29 y.o. G1P0 at 669w4d  admitted for induction of labor due to Spontaneous rupture of BOW., rupture of membranes  Subjective: Pt having anterior pressure , like bladder filling, foley draining well  Objective: BP 132/70 mmHg  Pulse 67  Temp(Src) 98.4 F (36.9 C) (Oral)  Resp 18  Ht 5\' 3"  (1.6 m)  Wt 131.543 kg (290 lb)  BMI 51.38 kg/m2  SpO2 100%  LMP 06/08/2014 (Approximate) I/O last 3 completed shifts: In: -  Out: 1000 [Urine:1000] Total I/O In: -  Out: 2400 [Urine:2400]  FHT:  FHR: 150 bpm, variability: moderate,  accelerations:  Present,  decelerations:  Absent UC:   regular, every 3 minutes SVE:   Dilation: Lip/rim Effacement (%): 100 Station: +1 Exam by:: Darlis LoanFergison, MD  Labs: Lab Results  Component Value Date   WBC 10.7* 02/24/2015   HGB 13.0 02/24/2015   HCT 37.9 02/24/2015   MCV 89.2 02/24/2015   PLT 274 02/24/2015    Assessment / Plan: Induction of labor due to PROM,  progressing well on pitocin  Labor: Progressing normally Preeclampsia:   Fetal Wellbeing:  Category I Pain Control:  Epidural I/D:  n/a Anticipated MOD:  NSVD  Buelah Rennie V 02/26/2015, 7:00 AM

## 2015-02-26 NOTE — Progress Notes (Signed)
Dorothy Flowers is a 29 y.o. G1P0 at 714w4d  admitted for induction of labor due to Spontaneous rupture of BOW., PROM at term.  Subjective: Pt has been laboring while resting on the right side, has had iupc work its way out, now being replaced.  Objective: BP 142/76 mmHg  Pulse 80  Temp(Src) 98.2 F (36.8 C) (Oral)  Resp 18  Ht 5\' 3"  (1.6 m)  Wt 290 lb (131.543 kg)  BMI 51.38 kg/m2  SpO2 100%  LMP 06/08/2014 (Approximate) I/O last 3 completed shifts: In: -  Out: 1000 [Urine:1000] Total I/O In: -  Out: 1600 [Urine:1600]  FHT:  FHR: 145 bpm, variability: moderate,  accelerations:  Present,  decelerations:  Absent UC:   regular, every 4 minutes SVE:   Dilation: 5 Effacement (%): 70 Station: -2 Exam by:: Dr Emelda FearFerguson Exam NOW: 7/75%/-1 Labs: Lab Results  Component Value Date   WBC 10.7* 02/24/2015   HGB 13.0 02/24/2015   HCT 37.9 02/24/2015   MCV 89.2 02/24/2015   PLT 274 02/24/2015    Assessment / Plan: Protracted active phase but still progressing   Labor: adequate progress to continue Preeclampsia:   Fetal Wellbeing:  Category I Pain Control:  Epidural I/D:  n/a Anticipated MOD:  NSVD  Dorothy Flowers 02/26/2015, 3:02 AM

## 2015-02-26 NOTE — Progress Notes (Signed)
Patient ID: Dorothy HammedJennifer L Flowers, female   DOB: 05/16/1986, 29 y.o.   MRN: [id number] Dorothy Flowers is a 29 y.o. G1P0 at 672w4d.  Subjective: Mild-mod pressure.  Objective: BP 159/98 mmHg  Pulse 79  Temp(Src) 98.2 F (36.8 C) (Oral)  Resp 18  Ht 5\' 3"  (1.6 m)  Wt 290 lb (131.543 kg)  BMI 51.38 kg/m2  SpO2 100%  LMP 06/08/2014 (Approximate)   FHT:  FHR: 155 bpm, variability: minmod,  accelerations:  15x15,  decelerations:  Mild variables (although occurring  after UC's, doubt late decels. Appear to be accels w/ pushing due to SS followed by return to baseline vs mild variables.) UC:   Q 2-3 minutes, strong  Dilation: 10 Dilation Complete Date: 02/26/15 Dilation Complete Time: 0915 Effacement (%): 100 Cervical Position: Anterior Station: -1, 0 Presentation: Vertex Exam by:: Paul Dykesina Feir RNC  Tried pushing w/ several contractions. Minimal mvmt.   Labs: No results found for this or any previous visit (from the past 24 hour(s)).  Assessment / Plan: 382w4d week IUP Labor: transition->second stage Fetal Wellbeing:  Category I-II Pain Control:  epidural Anticipated MOD:  SVD. Will labor down 1 hour. May need to decrease epidural to allow to to feel urge to push better.   [place] Rihaan Barrack, CNM 02/26/2015 10:03 AM

## 2015-02-27 ENCOUNTER — Encounter: Payer: Medicaid Other | Admitting: Obstetrics and Gynecology

## 2015-02-27 DIAGNOSIS — O4290 Premature rupture of membranes, unspecified as to length of time between rupture and onset of labor, unspecified weeks of gestation: Secondary | ICD-10-CM

## 2015-02-27 LAB — CBC
HCT: 31 % — ABNORMAL LOW (ref 36.0–46.0)
Hemoglobin: 10.4 g/dL — ABNORMAL LOW (ref 12.0–15.0)
MCH: 30.4 pg (ref 26.0–34.0)
MCHC: 33.5 g/dL (ref 30.0–36.0)
MCV: 90.6 fL (ref 78.0–100.0)
Platelets: 219 10*3/uL (ref 150–400)
RBC: 3.42 MIL/uL — ABNORMAL LOW (ref 3.87–5.11)
RDW: 14.3 % (ref 11.5–15.5)
WBC: 14.7 10*3/uL — AB (ref 4.0–10.5)

## 2015-02-27 NOTE — Progress Notes (Signed)
I spent time with pt and her mother as they processed this unexpected experience of having their baby in the NICU.    They were frustrated with the long labor and with the different answers they were given during that time.    They are grateful that [name] is doing okay at this point and are eager to hear the lab results.  They are aware of ongoing availability of chaplain support.  [id number]Chaplain Dyanne CarrelKaty Azalie Harbeck, Bcc Pager, [phone] 11:10 AM    02/27/15 1100  Clinical Encounter Type  Visited With Patient and family together  Visit Type Spiritual support  Referral From Nurse  Spiritual Encounters  Spiritual Needs Emotional

## 2015-02-27 NOTE — Clinical Social Work Maternal (Signed)
CLINICAL SOCIAL WORK MATERNAL/CHILD NOTE  Patient Details  Name: Dorothy Flowers MRN: [id number] Date of Birth: 04/02/1986  Date:  2015-07-26  Clinical Social Worker Initiating Note:  Kambryn Dapolito E. Brigitte Pulse, [place] Date/ Time Initiated:  02/27/15/1500     Child's Name:  Dorothy Flowers   Legal Guardian:  Mother   Need for Interpreter:  None   Date of Referral:        Reason for Referral:   (No referral-NICU admission)   Referral Source:      Address:  [address], [place], [place] [address]  Phone number:  [phone]   Household Members:      Natural Supports (not living in the home):  Friends, Immediate Family   Professional Supports:     Employment:     Type of Work:     Education:      Pensions consultant:  [employer]   Other Resources:      Cultural/Religious Considerations Which May Impact Care: None stated  Strengths:  Ability to meet basic needs , Compliance with medical plan , Pediatrician chosen , Home prepared for child , Understanding of illness (Pediatric follow up will be with [facility])   Risk Factors/Current Problems:  Adjustment to Illness  (MOB reports feeling "pissed" that baby went needed intensive care.)   Cognitive State:  Alert , Insightful , Linear Thinking    Mood/Affect:  Angry    CSW Assessment: CSW met with MOB in her third floor room/306 to introduce myself, offer support, and complete assessment due to baby's admission to NICU at 37.4 weeks.  MOB had three visitors with her and CSW offered to return at a later time, but she stated that now was a good time to talk with her and stated that CSW could talk about anything with her visitors present.  CSW asked how MOB is feeling and she replied, "kinda pissed."  CSW asked if she felt comfortable elaborating on that feeling and her mother took over the conversation from that time forward.  MGM states they do not understand why MOB's doctors allowed her to be ruptured for 48  hours, when "everything we've read says a baby needs to come out before that so they don't get an infection."  She reports, "now Danne Baxter is in the NICU for infection."  MGM explained that his name is Orlo Brickle, but she calls him Danne Baxter.  She and MOB added that MOB's doctors from [facility] weren't here at delivery.  MOB and MGM report that everyone in the NICU has been wonderful and that they have "gone along with all their (NICU) recommendations except for one."  MGM explained that they did not want staff to put a feeding tube in baby before attempting to feed him, but are accepting of the fact that he might need one after trying to bottle feed the first time.  CSW commended them for seeing the NICU experience as separate from the labor and delivery experience and not allowing their emotions from their initial experience affect how they view baby's care.   CSW asked MOB who her other two visitors are.  She informed CSW that the man is baby's dad and the other woman in the room is baby's other grandmother (PGM).  CSW asked FOB how he is coping with the situation and if he has any other children.  He reports that he had a baby a month ago who was stillborn.  CSW offered condolences and asked if he has  received counseling.  He said no.  CSW asked if he would be interested and he declined.  MGM stated that she didn't want CSW to "think anything about her daughter since FOB had babies one month apart" and proceeded to tell their story.  She states MOB wanted to be a single mom and that FOB agreed to be the "sperm donor."  Both MOB and MGM state that he can have as much or as little involvement in the baby's life as he chooses.  FOB is not on the birth certificate and therefore has no legal rights to the child.   CSW found it difficult to process MOB's feelings with her, as MGM answered all questions and mostly spoke for her.  CSW acknowledged their feelings of trauma when they saw baby come out "not  blue, but black," per MGM.  CSW discussed common emotions often experienced in the first two weeks after delivery as well as provided education regarding PPD signs and symptoms.  This education was somewhat rushed due to a call from NICU RN stating MOB should come to the unit now.  CSW explained ongoing support services offered by NICU CSW and provided contact information.  MGM informed CSW that MOB has a great support system and that they will be "watching out for her emotions."  CSW encouraged MOB to talk with her doctor if she has concerns about her emotions at any time.    CSW Plan/Description:  Patient/Family Education , Psychosocial Support and Ongoing Assessment of Needs    Miamarie Moll Elizabeth, LCSW 02/27/2015, 3:20 PM 

## 2015-02-27 NOTE — Progress Notes (Signed)
Post Partum Day 1  Subjective:  Dorothy Flowers is a [age] y.o. G1P1001 2331w4d s/p NSVD after SROM without onset of labor that necessitated augmentation.  No acute events overnight.  Pt denies problems with ambulating, voiding or po intake.  She denies nausea or vomiting.  Pain is well controlled.  She has had flatus. She has not had bowel movement.  Lochia Minimal.  Plan for birth control is abstinence.  Method of Feeding: Formula  Objective: Blood pressure 124/80, pulse 93, temperature 98.9 F (37.2 C), temperature source Oral, resp. rate 18, height 5\' 3"  (1.6 m), weight 290 lb (131.543 kg), last menstrual period 06/08/2014, SpO2 100 %, unknown if currently breastfeeding.  Physical Exam:  General: alert, cooperative and no distress Lochia:normal flow Chest: CTAB Heart: RRR no m/r/g Abdomen: +BS, soft, nontender,  Uterine Fundus: firm DVT Evaluation: No evidence of DVT seen on physical exam. Extremities: 2+ pitting edema to mid shin   Recent Labs  02/24/15 1430 02/27/15 0520  HGB 13.0 10.4*  HCT 37.9 31.0*    Assessment/Plan:  ASSESSMENT: Dorothy Flowers is a 29 y.o. G1P1001 5831w4d s/p term NSVD after ROM~54 hours  Plan for discharge tomorrow, Breastfeeding, Lactation consult, Social Work consult and Contraception abstincence Infant in NICU, plan to stay until PPD#2   LOS: 3 days   Federico FlakeKimberly Niles Burhanuddin Kohlmann 02/27/2015, 11:17 AM

## 2015-02-27 NOTE — Anesthesia Postprocedure Evaluation (Signed)
Anesthesia Post Note  Patient: Dorothy HammedJennifer L Bouler  Procedure(s) Performed: * No procedures listed *  Anesthesia type: Epidural  Patient location: Mother/Baby  Post pain: Pain level controlled  Post assessment: Post-op Vital signs reviewed  Last Vitals:  Filed Vitals:   02/26/15 2102  BP: 124/80  Pulse: 93  Temp: 37.2 C  Resp: 18    Post vital signs: Reviewed  Level of consciousness:alert  Complications: No apparent anesthesia complications

## 2015-02-28 NOTE — Discharge Summary (Addendum)
Obstetric Discharge Summary Reason for Admission: rupture of membranes Prenatal Procedures: NST Intrapartum Procedures: spontaneous vaginal delivery Postpartum Procedures: none Complications-Operative and Postpartum: 2nd degree degree perineal laceration HEMOGLOBIN  Date Value Ref Range Status  02/27/2015 10.4* 12.0 - 15.0 g/dL Final   HCT  Date Value Ref Range Status  02/27/2015 31.0* 36.0 - 46.0 % Final   HEMATOCRIT  Date Value Ref Range Status  12/18/2014 33.8* 34.0 - 46.6 % Final  Active Problems:  Indication for care in labor or delivery   Pt s/p SVD. Patient was admitted 07/12 with h/o PROM at 37 weeks. She has postpartum course that was uncomplicated including no problems with ambulating, PO intake, urination, pain, or bleeding. The pt feels ready to go home and will be discharged with outpatient follow-up. She denies need for contraception and reports that she was a virgin who had her friend get her pregnant.  She denies desire to be sexually active again unless she wants to conceive.     Today: No acute events overnight. Pt denies problems with ambulating, voiding or po intake. She denies nausea or vomiting. Pain is well controlled. She has had flatus. She has not had bowel movement. Lochia Small. Plan for birth control is: abstinence. Method of Feeding: Breast. Pt is pumping while infant in NICU.  Physical Exam:  General: alert and no distress Lochia: appropriate Uterine Fundus: firm DVT Evaluation: No evidence of DVT seen on physical exam.  Discharge Diagnoses: Term Pregnancy-delivered  Discharge Information: Date: 02/28/2015 Activity: unrestricted Diet: routine Medications: Ibuprofen Condition: stable Instructions: refer to practice specific booklet Discharge to: home Follow-up Information    [facility]   Contact information:   [address] [place] [id number] [phone]       Newborn Data: Live born female  Birth Weight: 6 lb 2.4 oz (2790 g) APGAR: 4, 4  Infant to remain in NICU  [facility] Atlanta[facility]RAW[facility]-SMITH, Tykera Skates 02/28/2015, 9:06 PM

## 2015-02-28 NOTE — Progress Notes (Signed)
Discharge teaching complete. Pt understood all instructions and did not have any questions. Pt ambulated out of the hospital and discharged home to family.  

## 2015-02-28 NOTE — Discharge Instructions (Signed)

## 2015-03-03 ENCOUNTER — Telehealth: Payer: Self-pay | Admitting: *Deleted

## 2015-03-03 NOTE — Telephone Encounter (Signed)
Pt called back, I advised the pt that she needed to make sure she is taking her PNV and to increase her intake of iron rich foods. Pt verbalized understanding.

## 2015-03-03 NOTE — Telephone Encounter (Signed)
[facility] called with pt's Hgb from a visit there today. Pt's Hgb was 10.1 and currently taking her PNV. I spoke with Joellyn HaffKim Booker and she advised to tell the pt to continue taking her PNV and eat iron rich foods. I will contact pt and inform her of this.

## 2015-03-19 ENCOUNTER — Ambulatory Visit (INDEPENDENT_AMBULATORY_CARE_PROVIDER_SITE_OTHER): Payer: Medicaid Other | Admitting: Obstetrics and Gynecology

## 2015-03-19 ENCOUNTER — Encounter: Payer: Self-pay | Admitting: Obstetrics and Gynecology

## 2015-03-19 VITALS — BP 118/62 | HR 84 | Wt 276.0 lb

## 2015-03-19 DIAGNOSIS — A499 Bacterial infection, unspecified: Secondary | ICD-10-CM | POA: Diagnosis not present

## 2015-03-19 DIAGNOSIS — L089 Local infection of the skin and subcutaneous tissue, unspecified: Principal | ICD-10-CM

## 2015-03-19 DIAGNOSIS — L0889 Other specified local infections of the skin and subcutaneous tissue: Secondary | ICD-10-CM | POA: Diagnosis not present

## 2015-03-19 DIAGNOSIS — B9689 Other specified bacterial agents as the cause of diseases classified elsewhere: Secondary | ICD-10-CM

## 2015-03-19 MED ORDER — HYDROCODONE-ACETAMINOPHEN 5-325 MG PO TABS: 1.0000 | ORAL_TABLET | Freq: Four times a day (QID) | ORAL | Status: DC | PRN

## 2015-03-19 MED ORDER — DOXYCYCLINE HYCLATE 100 MG PO CAPS: 100.0000 mg | ORAL_CAPSULE | Freq: Two times a day (BID) | ORAL | Status: DC

## 2015-03-19 NOTE — Progress Notes (Signed)
Patient ID: Dorothy Flowers, female   DOB: [birth year], 29 y.o.   MRN: [id number]    [facility] Clinic Visit  Patient name: Dorothy Flowers MRN [id number]  Date of birth: 1986-07-26 workin appt CC & HPI:  Dorothy Flowers is a 29 y.o. female with a history of abscesses presenting today for two areas of gradually worsening redness and swelling, one on her left upper thigh and the other on her right lower abdomen that started a few weeks ago. She notes moderate associated pain. Pt has tried hot compresses and draining the right area with some relief. Pt gave birth on 7/13 at 37 weeks. She denies fever.  ROS:  A complete 10 system review of systems was obtained and all systems are negative except as noted in the HPI and PMH.   Pertinent History Reviewed:   Reviewed.  Medical         Past Medical History  Diagnosis Date  . Asthma     as child                              Surgical Hx:    Past Surgical History  Procedure Laterality Date  . Wisdom tooth extraction     Medications: Reviewed & Updated - see associated section                       Current outpatient prescriptions:  .  albuterol (PROVENTIL HFA;VENTOLIN HFA) 108 (90 BASE) MCG/ACT inhaler, Inhale 1-2 puffs into the lungs every 6 (six) hours as needed for wheezing or shortness of breath., Disp: 1 Inhaler, Rfl: 1 .  Prenatal Multivit-Min-Fe-FA (PRENATAL VITAMINS) 0.8 MG tablet, Take 1 tablet by mouth daily., Disp: 30 tablet, Rfl: 12   Social History: Reviewed -  reports that she has quit smoking. She has never used smokeless tobacco.  Objective Findings:  Vitals: Blood pressure 118/62, pulse 84, weight 276 lb (125.193 kg), not currently breastfeeding.  Physical Examination: General appearance - alert, well appearing, and in no distress, oriented to person, place, and time and overweight Mental status - alert, oriented to person, place, and time, normal mood, behavior, speech, dress, motor activity, and thought  processes Skin - 3 cm fluctuant hard indurated areas lateral to left superior iliac crest and lateral to left inguinal crease; 1.5 cm erythematous with two pinhole lesions  INCISION AND DRAINAGE PROCEDURE NOTE: Patient identification was confirmed and verbal consent was obtained. This procedure was performed by Tilda Burrow, MD at 11:02 AM. Site: left upper thigh Sterile procedures observed. Needle size: 25 gauge Anesthetic used (type and amt): Lidocaine 1% without epinephrine Blade size: 11 Drainage: Bloody and purulent drainage Complexity: Complex Packing used Site anesthetized, incision made over site, wound drained and explored loculations, rinsed with copious amounts of normal saline, wound packed with sterile gauze, covered with dry, sterile dressing.  Pt tolerated procedure well without complications.  Instructions for care discussed verbally and pt provided with additional written instructions for homecare and f/u.  INCISION AND DRAINAGE PROCEDURE NOTE: Patient identification was confirmed and verbal consent was obtained. This procedure was performed by Rickey Barbara,, PA student supervised by Tilda Burrow, MD at 11:10 AM. Site: Right lower abdomen Sterile procedures observed Needle size: 25 gauge Anesthetic used (type and amt): 1% lidocaine without epinephrine Blade size: 11 Drainage: Bloody and purulent drainage Complexity: Complex Packing used Site anesthetized, incision made over site,  wound drained and explored loculations, rinsed with copious amounts of normal saline, wound packed with sterile gauze, covered with dry, sterile dressing.  Pt tolerated procedure well without complications.  Instructions for care discussed verbally and pt provided with additional written instructions for homecare and f/u.  Assessment & Plan:   A: probable MRSA folliulitis. 1. Two abscessed areas; I&D performed  P:  1. Pt to remove packing in 48 hours; keep wounds  covered for 5 days 2. Prescribe antibiotic treatment doxycycline 100 bid x 14 d 3 Rx  Vicodin 5/325 x 20 tab This chart was scribed for Tilda Burrow, MD by Gwenyth Ober, Medical Scribe. This patient was seen in room 1 and the patient's care was started at 10:42 AM.   I personally performed the services described in this documentation, which was SCRIBED in my presence. The recorded information has been reviewed and considered accurate. It has been edited as necessary during review. Tilda Burrow, MD

## 2015-03-19 NOTE — Progress Notes (Signed)
Patient ID: Dorothy Flowers, female   DOB: 12-29-1985, 29 y.o.   MRN: [id number]

## 2015-03-27 ENCOUNTER — Ambulatory Visit (INDEPENDENT_AMBULATORY_CARE_PROVIDER_SITE_OTHER): Payer: Medicaid Other | Admitting: Obstetrics & Gynecology

## 2015-03-27 ENCOUNTER — Encounter: Payer: Self-pay | Admitting: Obstetrics & Gynecology

## 2015-03-27 VITALS — BP 90/58 | HR 76 | Ht 66.0 in | Wt 280.0 lb

## 2015-03-27 DIAGNOSIS — A4902 Methicillin resistant Staphylococcus aureus infection, unspecified site: Secondary | ICD-10-CM

## 2015-03-27 MED ORDER — SILVER SULFADIAZINE 1 % EX CREA: TOPICAL_CREAM | CUTANEOUS | Status: DC

## 2015-03-27 NOTE — Progress Notes (Signed)
Patient ID: Dorothy Flowers, female   DOB: 29-Dec-1985, 29 y.o.   MRN: [id number] Chief Complaint  Patient presents with  . Routine Post Op    thinks Doxycycline is causing leg cramps    Blood pressure 90/58, pulse 76, height  (1.676 m), weight 280 lb (127.007 kg), not currently breastfeeding.    Subjective Pt had abscesses that were incised and drained last week  Most likely MRSA On doxycycline  Objective 2 areas on left upper leg and right lower abdomen both intertriginous areas Healing well Gentian violet placed  Pertinent ROS No burning with urination, frequency or urgency No nausea, vomiting or diarrhea Nor fever chills or other constitutional symptoms   Labs or studies   Impression New Diagnosis:   Established relevant diagnosis(es): MRSA abscess x 2  Plan/Recommendations Continue doxycycline Silvadene cream Topical gentian violet  Follow up 2 weeks       All questions were answered.  Outpatient Prescriptions Prior to Visit  Medication Sig Dispense Refill  . albuterol (PROVENTIL HFA;VENTOLIN HFA) 108 (90 BASE) MCG/ACT inhaler Inhale 1-2 puffs into the lungs every 6 (six) hours as needed for wheezing or shortness of breath. 1 Inhaler 1  . doxycycline (VIBRAMYCIN) 100 MG capsule Take 1 capsule (100 mg total) by mouth 2 (two) times daily. 28 capsule 0  . HYDROcodone-acetaminophen (NORCO/VICODIN) 5-325 MG per tablet Take 1 tablet by mouth every 6 (six) hours as needed. 20 tablet 0  . Prenatal Multivit-Min-Fe-FA (PRENATAL VITAMINS) 0.8 MG tablet Take 1 tablet by mouth daily. 30 tablet 12   No facility-administered medications prior to visit.    Past Medical History  Diagnosis Date  . Asthma     as child  . MRSA (methicillin resistant Staphylococcus aureus)     Past Surgical History  Procedure Laterality Date  . Wisdom tooth extraction      OB History    Gravida Para Term Preterm AB TAB SAB Ectopic Multiple Living   0 1       Allergies  Allergen Reactions  . Keflex [Cephalexin] Shortness Of Breath and Rash  . Cephalosporins Swelling and Other (See Comments)    Pain    Social History   Social History  . Marital Status: Single    Spouse Name: N/A  . Number of Children: N/A  . Years of Education: N/A   Social History Main Topics  . Smoking status: Former Smoker -- 0.00 packs/day    Types: Cigarettes  . Smokeless tobacco: Never Used     Comment: quit with + preg  . Alcohol Use: No  . Drug Use: No  . Sexual Activity: Not Currently    Birth Control/ Protection: None   Other Topics Concern  . None   Social History Narrative    Family History  Problem Relation Age of Onset  . Hypertension Mother   . Stroke Mother   . Diabetes Maternal Aunt   . Diabetes Maternal Grandmother   . Heart disease Father   . Hypertension Father   . Mental illness Paternal Uncle   . COPD Paternal Grandmother     smokes  . Cancer Paternal Grandfather   . Heart disease Paternal Grandfather

## 2015-04-15 ENCOUNTER — Encounter: Payer: Self-pay | Admitting: Advanced Practice Midwife

## 2015-04-15 ENCOUNTER — Ambulatory Visit (INDEPENDENT_AMBULATORY_CARE_PROVIDER_SITE_OTHER): Payer: Medicaid Other | Admitting: Advanced Practice Midwife

## 2015-04-15 MED ORDER — DOXYCYCLINE HYCLATE 100 MG PO CAPS: 100.0000 mg | ORAL_CAPSULE | Freq: Two times a day (BID) | ORAL | Status: DC

## 2015-04-15 NOTE — Progress Notes (Signed)
  Dorothy Flowers is a 29 y.o. who presents for a postpartum visit. She is 6 weeks postpartum following a spontaneous vaginal delivery. I have fully reviewed the prenatal and intrapartum course. The delivery was at 37.4 gestational weeks.  Anesthesia: epidural. Postpartum course has been complicated by MRSA abscesses on legs, I&D'd. Baby's course has been uneventful. Baby is feeding by bottle. Bleeding: has had a regualr period. Bowel function is normal. Bladder function is normal. Patient is not sexually active. Contraception method is abstinence. Postpartum depression screening: negative.   Current outpatient prescriptions:  .  Prenatal Multivit-Min-Fe-FA (PRENATAL VITAMINS) 0.8 MG tablet, Take 1 tablet by mouth daily., Disp: 30 tablet, Rfl: 12 .  albuterol (PROVENTIL HFA;VENTOLIN HFA) 108 (90 BASE) MCG/ACT inhaler, Inhale 1-2 puffs into the lungs every 6 (six) hours as needed for wheezing or shortness of breath. (Patient not taking: Reported on 04/15/2015), Disp: 1 Inhaler, Rfl: 1 .  doxycycline (VIBRAMYCIN) 100 MG capsule, Take 1 capsule (100 mg total) by mouth 2 (two) times daily. (Patient not taking: Reported on 04/15/2015), Disp: 28 capsule, Rfl: 0 .  HYDROcodone-acetaminophen (NORCO/VICODIN) 5-325 MG per tablet, Take 1 tablet by mouth every 6 (six) hours as needed. (Patient not taking: Reported on 04/15/2015), Disp: 20 tablet, Rfl: 0 .  silver sulfADIAZINE (SILVADENE) 1 % cream, apply BID to areas (Patient not taking: Reported on 04/15/2015), Disp: 50 g, Rfl: 11  Review of Systems   Constitutional: Negative for fever and chills Eyes: Negative for visual disturbances Respiratory: Negative for shortness of breath, dyspnea Cardiovascular: Negative for chest pain or palpitations  Gastrointestinal: Negative for vomiting, diarrhea and constipation Genitourinary: Negative for dysuria and urgency Musculoskeletal: Negative for back pain, joint pain, myalgias  Neurological: Negative for dizziness  and headaches   Objective:     Filed Vitals:   04/15/15 0841  BP: 100/74  Pulse: 72   General:  alert, cooperative and no distress   Breasts:  negative  Lungs: clear to auscultation bilaterally  Heart:  regular rate and rhythm  Abdomen: Soft, nontender   Vulva:  normal  Vagina: normal vagina  Cervix:  closed  Corpus: Well involuted   Boil on right thigh still draining some per pt, but otherwise looks pretty good.    Rectal Exam: no hemorrhoids        Assessment:    normal postpartum exam.  Plan:    1. Contraception: abstinence (not dating anyone)  Rx doxycycline  BID X7 2. Follow up in:   or as needed.

## 2016-03-02 ENCOUNTER — Other Ambulatory Visit: Payer: Self-pay | Admitting: Obstetrics & Gynecology

## 2016-03-02 DIAGNOSIS — O3680X0 Pregnancy with inconclusive fetal viability, not applicable or unspecified: Secondary | ICD-10-CM

## 2016-03-03 ENCOUNTER — Other Ambulatory Visit: Payer: Self-pay | Admitting: Obstetrics & Gynecology

## 2016-03-03 ENCOUNTER — Ambulatory Visit (INDEPENDENT_AMBULATORY_CARE_PROVIDER_SITE_OTHER): Payer: Medicaid Other

## 2016-03-03 DIAGNOSIS — Z3A08 8 weeks gestation of pregnancy: Secondary | ICD-10-CM | POA: Diagnosis not present

## 2016-03-03 DIAGNOSIS — O3680X0 Pregnancy with inconclusive fetal viability, not applicable or unspecified: Secondary | ICD-10-CM

## 2016-03-03 NOTE — Progress Notes (Signed)
US 7+4 wks,single IUP w/ys,pos fht 153 bpm,normal ov's bilat,crl 12.5 mm

## 2016-03-15 ENCOUNTER — Ambulatory Visit (INDEPENDENT_AMBULATORY_CARE_PROVIDER_SITE_OTHER): Payer: Medicaid Other | Admitting: Women's Health

## 2016-03-15 ENCOUNTER — Encounter: Payer: Self-pay | Admitting: Women's Health

## 2016-03-15 VITALS — BP 126/72 | HR 100 | Ht 64.0 in | Wt 291.8 lb

## 2016-03-15 DIAGNOSIS — Z331 Pregnant state, incidental: Secondary | ICD-10-CM

## 2016-03-15 DIAGNOSIS — Z3490 Encounter for supervision of normal pregnancy, unspecified, unspecified trimester: Secondary | ICD-10-CM | POA: Insufficient documentation

## 2016-03-15 DIAGNOSIS — E669 Obesity, unspecified: Secondary | ICD-10-CM

## 2016-03-15 DIAGNOSIS — Z3682 Encounter for antenatal screening for nuchal translucency: Secondary | ICD-10-CM

## 2016-03-15 DIAGNOSIS — O99331 Smoking (tobacco) complicating pregnancy, first trimester: Secondary | ICD-10-CM

## 2016-03-15 DIAGNOSIS — Z1389 Encounter for screening for other disorder: Secondary | ICD-10-CM

## 2016-03-15 DIAGNOSIS — Z0283 Encounter for blood-alcohol and blood-drug test: Secondary | ICD-10-CM

## 2016-03-15 DIAGNOSIS — Z369 Encounter for antenatal screening, unspecified: Secondary | ICD-10-CM

## 2016-03-15 DIAGNOSIS — O99211 Obesity complicating pregnancy, first trimester: Secondary | ICD-10-CM

## 2016-03-15 DIAGNOSIS — Z3491 Encounter for supervision of normal pregnancy, unspecified, first trimester: Secondary | ICD-10-CM

## 2016-03-15 DIAGNOSIS — Z3481 Encounter for supervision of other normal pregnancy, first trimester: Secondary | ICD-10-CM

## 2016-03-15 DIAGNOSIS — Z3A10 10 weeks gestation of pregnancy: Secondary | ICD-10-CM

## 2016-03-15 DIAGNOSIS — Z6841 Body Mass Index (BMI) 40.0 and over, adult: Secondary | ICD-10-CM

## 2016-03-15 MED ORDER — PRENATAL PLUS 27-1 MG PO TABS: 1.0000 | ORAL_TABLET | Freq: Every day | ORAL | 12 refills | Status: DC

## 2016-03-15 NOTE — Patient Instructions (Signed)

## 2016-03-15 NOTE — Progress Notes (Signed)
  Subjective:  Dorothy Flowers is a 30 y.o. G41P1001 Caucasian female at [redacted]w[redacted]d by LMP c/w 7wk u/s, being seen today for her first obstetrical visit.  Her obstetrical history is significant for term uncomplicated svb x 1, smoker- states she smoked 2 today d/t stress but is completely done after today, obesity-pre gravid BMI 51.5, denies h/o HTN- has been under a lot of stress lately and has been up for 18hrs today d/t working 3rd shift.  Pregnancy history fully reviewed.  Patient reports n/v- declines meds. Denies vb, cramping, uti s/s, abnormal/malodorous vag d/c, or vulvovaginal itching/irritation.  BP (!) 144/80   Pulse 100   Wt 291 lb 12.8 oz (132.4 kg)   LMP 01/10/2016 (Exact Date)   BMI 48.56 kg/m   BP recheck 126/72  HISTORY: OB History  Gravida Para Term Preterm AB Living  2 1 1     1   SAB TAB Ectopic Multiple Live Births        0 1    # Outcome Date GA Lbr Len/2nd Weight Sex Delivery Anes PTL Lv  2 Current           1 Term 02/26/15 [redacted]w[redacted]d 29:06 / 02:09 6 lb 2.4 oz (2.79 kg) M Vag-Spont EPI  LIV     Past Medical History:  Diagnosis Date  . Asthma    as child  . MRSA (methicillin resistant Staphylococcus aureus)    Past Surgical History:  Procedure Laterality Date  . WISDOM TOOTH EXTRACTION     Family History  Problem Relation Age of Onset  . Hypertension Mother   . Stroke Mother   . Diabetes Maternal Aunt   . Diabetes Maternal Grandmother   . Heart disease Father   . Hypertension Father   . Stroke Father   . COPD Paternal Grandmother     smokes  . Cancer Paternal Grandfather   . Heart disease Paternal Grandfather   . Mental illness Paternal Uncle     Exam   System:     General: Well developed & nourished, no acute distress   Skin: Warm & dry, normal coloration and turgor, no rashes   Neurologic: Alert & oriented, normal mood   Cardiovascular: Regular rate & rhythm   Respiratory: Effort & rate normal, LCTAB, acyanotic   Abdomen: Soft, non tender   Extremities: normal strength, tone  Thin prep pap smear 08/05/14: neg FHR: 170 via informal u/s   Assessment:   Pregnancy: G2P1001 Patient Active Problem List   Diagnosis Date Noted  . Supervision of normal pregnancy 03/15/2016    Priority: High  . OBESITY, NOS 10/12/2006  . ASTHMA, UNSPECIFIED 10/12/2006  . ACNE 10/12/2006    [redacted]w[redacted]d G2P1001 New OB visit Obesity Smoker  Plan:  Initial labs drawn Continue prenatal vitamins Problem list reviewed and updated Reviewed n/v relief measures and warning s/s to report Reviewed recommended weight gain based on pre-gravid BMI Encouraged well-balanced diet Genetic Screening discussed Integrated Screen: requested Cystic fibrosis screening discussed declined Ultrasound discussed; fetal survey: requested Follow up in 3 weeks for 1st it/nt and visit [facility] completed  Marge Duncans CNM, [facility] 03/15/2016 11:45 AM

## 2016-03-16 ENCOUNTER — Encounter: Payer: Self-pay | Admitting: Women's Health

## 2016-03-16 DIAGNOSIS — Z2839 Other underimmunization status: Secondary | ICD-10-CM | POA: Insufficient documentation

## 2016-03-16 DIAGNOSIS — Z283 Underimmunization status: Secondary | ICD-10-CM

## 2016-03-16 DIAGNOSIS — O09899 Supervision of other high risk pregnancies, unspecified trimester: Secondary | ICD-10-CM | POA: Insufficient documentation

## 2016-03-17 ENCOUNTER — Telehealth: Payer: Self-pay | Admitting: *Deleted

## 2016-03-17 LAB — PMP SCREEN PROFILE (10S), URINE
Amphetamine Screen, Ur: NEGATIVE ng/mL
BENZODIAZEPINE SCREEN, URINE: NEGATIVE ng/mL
Barbiturate Screen, Ur: NEGATIVE ng/mL
CANNABINOIDS UR QL SCN: NEGATIVE ng/mL
COCAINE(METAB.) SCREEN, URINE: NEGATIVE ng/mL
Creatinine(Crt), U: 176 mg/dL (ref 20.0–300.0)
Methadone Scn, Ur: NEGATIVE ng/mL
OPIATE SCRN UR: NEGATIVE ng/mL
Oxycodone+Oxymorphone Ur Ql Scn: NEGATIVE ng/mL
PCP Scrn, Ur: NEGATIVE ng/mL
Ph of Urine: 5.6 (ref 4.5–8.9)
Propoxyphene, Screen: NEGATIVE ng/mL

## 2016-03-17 LAB — CBC
HEMATOCRIT: 38 % (ref 34.0–46.6)
HEMOGLOBIN: 12.8 g/dL (ref 11.1–15.9)
MCH: 29.3 pg (ref 26.6–33.0)
MCHC: 33.7 g/dL (ref 31.5–35.7)
MCV: 87 fL (ref 79–97)
Platelets: 372 10*3/uL (ref 150–379)
RBC: 4.37 x10E6/uL (ref 3.77–5.28)
RDW: 14.1 % (ref 12.3–15.4)
WBC: 11.2 10*3/uL — ABNORMAL HIGH (ref 3.4–10.8)

## 2016-03-17 LAB — RPR: RPR Ser Ql: NONREACTIVE

## 2016-03-17 LAB — URINALYSIS, ROUTINE W REFLEX MICROSCOPIC
Bilirubin, UA: NEGATIVE
Glucose, UA: NEGATIVE
Ketones, UA: NEGATIVE
Nitrite, UA: NEGATIVE
PH UA: 6 (ref 5.0–7.5)
Protein, UA: NEGATIVE
RBC, UA: NEGATIVE
Specific Gravity, UA: 1.026 (ref 1.005–1.030)
Urobilinogen, Ur: 0.2 mg/dL (ref 0.2–1.0)

## 2016-03-17 LAB — MICROSCOPIC EXAMINATION: Casts: NONE SEEN /lpf

## 2016-03-17 LAB — HIV ANTIBODY (ROUTINE TESTING W REFLEX): HIV Screen 4th Generation wRfx: NONREACTIVE

## 2016-03-17 LAB — URINE CULTURE

## 2016-03-17 LAB — ANTIBODY SCREEN: Antibody Screen: NEGATIVE

## 2016-03-17 LAB — HEPATITIS B SURFACE ANTIGEN: HEP B S AG: NEGATIVE

## 2016-03-17 LAB — VARICELLA ZOSTER ANTIBODY, IGG: Varicella zoster IgG: <135 index — ABNORMAL LOW (ref >165)

## 2016-03-17 LAB — ABO/RH: RH TYPE: POSITIVE

## 2016-03-17 LAB — GC/CHLAMYDIA PROBE AMP
Chlamydia trachomatis, NAA: NEGATIVE
Neisseria gonorrhoeae by PCR: NEGATIVE

## 2016-03-17 LAB — RUBELLA SCREEN

## 2016-03-17 NOTE — Telephone Encounter (Signed)
Pt states her son dx with chicken pox, pt is non immune. Please advise.

## 2016-03-17 NOTE — Telephone Encounter (Signed)
Pt called back and it is not chicken pox

## 2016-04-05 ENCOUNTER — Ambulatory Visit (INDEPENDENT_AMBULATORY_CARE_PROVIDER_SITE_OTHER): Payer: Medicaid Other

## 2016-04-05 ENCOUNTER — Encounter: Payer: Self-pay | Admitting: Obstetrics & Gynecology

## 2016-04-05 ENCOUNTER — Ambulatory Visit (INDEPENDENT_AMBULATORY_CARE_PROVIDER_SITE_OTHER): Payer: Medicaid Other | Admitting: Obstetrics & Gynecology

## 2016-04-05 VITALS — BP 80/60 | HR 74 | Wt 288.0 lb

## 2016-04-05 DIAGNOSIS — Z3482 Encounter for supervision of other normal pregnancy, second trimester: Secondary | ICD-10-CM

## 2016-04-05 DIAGNOSIS — Z331 Pregnant state, incidental: Secondary | ICD-10-CM

## 2016-04-05 DIAGNOSIS — Z6841 Body Mass Index (BMI) 40.0 and over, adult: Secondary | ICD-10-CM | POA: Diagnosis not present

## 2016-04-05 DIAGNOSIS — Z1389 Encounter for screening for other disorder: Secondary | ICD-10-CM

## 2016-04-05 DIAGNOSIS — Z3492 Encounter for supervision of normal pregnancy, unspecified, second trimester: Secondary | ICD-10-CM

## 2016-04-05 DIAGNOSIS — Z3A13 13 weeks gestation of pregnancy: Secondary | ICD-10-CM

## 2016-04-05 DIAGNOSIS — Z36 Encounter for antenatal screening of mother: Secondary | ICD-10-CM

## 2016-04-05 DIAGNOSIS — O99211 Obesity complicating pregnancy, first trimester: Secondary | ICD-10-CM

## 2016-04-05 DIAGNOSIS — Z3682 Encounter for antenatal screening for nuchal translucency: Secondary | ICD-10-CM

## 2016-04-05 DIAGNOSIS — Z3491 Encounter for supervision of normal pregnancy, unspecified, first trimester: Secondary | ICD-10-CM

## 2016-04-05 LAB — POCT URINALYSIS DIPSTICK
GLUCOSE UA: NEGATIVE
Ketones, UA: NEGATIVE
Nitrite, UA: NEGATIVE
Protein, UA: NEGATIVE
RBC UA: NEGATIVE

## 2016-04-05 MED ORDER — OMEPRAZOLE 20 MG PO CPDR: 20.0000 mg | DELAYED_RELEASE_CAPSULE | Freq: Every day | ORAL | 6 refills | Status: DC

## 2016-04-05 NOTE — Progress Notes (Signed)
US TV 12+2 wks,measurements c/w dates,normal ov's bilat,fhr 164 bpm,NB present,NT 1.2 mm,crl 59.7 mm,limited view because of pt body habitus.

## 2016-04-05 NOTE — Progress Notes (Signed)
G2P1001 3881w2d Estimated Date of Delivery: 10/16/16  Blood pressure (!) 80/60, pulse 74, weight 288 lb (130.6 kg), last menstrual period 01/10/2016, unknown if currently breastfeeding.   BP weight and urine results all reviewed and noted.  Please refer to the obstetrical flow sheet for the fundal height and fetal heart rate documentation:  Patient reports good fetal movement, denies any bleeding and no rupture of membranes symptoms or regular contractions. Patient is without complaints. All questions were answered.  Orders Placed This Encounter  Procedures  . Maternal Screen, Integrated #1  . POCT urinalysis dipstick    Plan:  Continued routine obstetrical care, NT scan normal, 1st IT today  Return in about 4 weeks (around 05/03/2016) for LROB, 2nd IT.   Meds ordered this encounter  Medications  . omeprazole (PRILOSEC) 20 MG capsule    Sig: Take 1 capsule (20 mg total) by mouth daily. 1 tablet a day    Dispense:  30 capsule    Refill:  6

## 2016-04-07 LAB — MATERNAL SCREEN, INTEGRATED #1
Crown Rump Length: 59.7 mm
GEST. AGE ON COLLECTION DATE: 12.4 wk
Maternal Age at EDD: 31.9 years
NUMBER OF FETUSES: 1
Nuchal Translucency (NT): 1.2 mm
PAPP-A Value: 271.4 ng/mL
PDF: 0
WEIGHT: 288 [lb_av]

## 2016-05-03 ENCOUNTER — Ambulatory Visit (INDEPENDENT_AMBULATORY_CARE_PROVIDER_SITE_OTHER): Payer: Medicaid Other | Admitting: Women's Health

## 2016-05-03 ENCOUNTER — Encounter: Payer: Self-pay | Admitting: Women's Health

## 2016-05-03 VITALS — BP 110/64 | HR 89 | Wt 286.4 lb

## 2016-05-03 DIAGNOSIS — Z331 Pregnant state, incidental: Secondary | ICD-10-CM | POA: Diagnosis not present

## 2016-05-03 DIAGNOSIS — Z3A17 17 weeks gestation of pregnancy: Secondary | ICD-10-CM

## 2016-05-03 DIAGNOSIS — Z3482 Encounter for supervision of other normal pregnancy, second trimester: Secondary | ICD-10-CM

## 2016-05-03 DIAGNOSIS — Z23 Encounter for immunization: Secondary | ICD-10-CM

## 2016-05-03 DIAGNOSIS — Z363 Encounter for antenatal screening for malformations: Secondary | ICD-10-CM

## 2016-05-03 DIAGNOSIS — Z3492 Encounter for supervision of normal pregnancy, unspecified, second trimester: Secondary | ICD-10-CM

## 2016-05-03 DIAGNOSIS — Z3682 Encounter for antenatal screening for nuchal translucency: Secondary | ICD-10-CM

## 2016-05-03 DIAGNOSIS — Z1389 Encounter for screening for other disorder: Secondary | ICD-10-CM

## 2016-05-03 LAB — POCT URINALYSIS DIPSTICK
Blood, UA: NEGATIVE
Glucose, UA: NEGATIVE
Ketones, UA: NEGATIVE
LEUKOCYTES UA: NEGATIVE
NITRITE UA: NEGATIVE
PROTEIN UA: NEGATIVE

## 2016-05-03 NOTE — Progress Notes (Signed)
Low-risk OB appointment G2P1001 7458w2d Estimated Date of Delivery: 10/16/16 BP 110/64   Pulse 89   Wt 286 lb 6.4 oz (129.9 kg)   LMP 01/10/2016 (Exact Date)   BMI 49.16 kg/m   BP, weight, and urine reviewed.  Refer to obstetrical flow sheet for FH & FHR.  Feeling some fm. Denies cramping, lof, vb, or uti s/s. Some leg cramps- gave printed relief measures. Reviewed warning s/s to report. Plan:  Continue routine obstetrical care  F/U in 4wks for OB appointment and anatomy u/s 2nd IT and flu shot today

## 2016-05-03 NOTE — Patient Instructions (Addendum)
Tips to Help Leg Cramps  Increase dietary sources of calcium (milk, yogurt, cheese, leafy greens, seafood, legumes, and fruit) and magnesium (dark leafy greens, nuts, seeds, fish, beans, whole grains, avocados, yogurt, bananas, dried fruit, dark chocolate)  Spoonful of regular yellow mustard every night  Pickle juice  Magnesium supplement: 5mmol in the morning, 10mmol at night (can find in the vitamin aisle)  Dorsiflexion of foot: pointing your toes back towards your knee during the cramp      Second Trimester of Pregnancy The second trimester is from week 13 through week 28, months 4 through 6. The second trimester is often a time when you feel your best. Your body has also adjusted to being pregnant, and you begin to feel better physically. Usually, morning sickness has lessened or quit completely, you may have more energy, and you may have an increase in appetite. The second trimester is also a time when the fetus is growing rapidly. At the end of the sixth month, the fetus is about 9 inches long and weighs about 1 pounds. You will likely begin to feel the baby move (quickening) between 18 and 20 weeks of the pregnancy. BODY CHANGES Your body goes through many changes during pregnancy. The changes vary from woman to woman.   Your weight will continue to increase. You will notice your lower abdomen bulging out.  You may begin to get stretch marks on your hips, abdomen, and breasts.  You may develop headaches that can be relieved by medicines approved by your health care provider.  You may urinate more often because the fetus is pressing on your bladder.  You may develop or continue to have heartburn as a result of your pregnancy.  You may develop constipation because certain hormones are causing the muscles that push waste through your intestines to slow down.  You may develop hemorrhoids or swollen, bulging veins (varicose veins).  You may have back pain because of the weight gain  and pregnancy hormones relaxing your joints between the bones in your pelvis and as a result of a shift in weight and the muscles that support your balance.  Your breasts will continue to grow and be tender.  Your gums may bleed and may be sensitive to brushing and flossing.  Dark spots or blotches (chloasma, mask of pregnancy) may develop on your face. This will likely fade after the baby is born.  A dark line from your belly button to the pubic area (linea nigra) may appear. This will likely fade after the baby is born.  You may have changes in your hair. These can include thickening of your hair, rapid growth, and changes in texture. Some women also have hair loss during or after pregnancy, or hair that feels dry or thin. Your hair will most likely return to normal after your baby is born. WHAT TO EXPECT AT YOUR PRENATAL VISITS During a routine prenatal visit:  You will be weighed to make sure you and the fetus are growing normally.  Your blood pressure will be taken.  Your abdomen will be measured to track your baby's growth.  The fetal heartbeat will be listened to.  Any test results from the previous visit will be discussed. Your health care provider may ask you:  How you are feeling.  If you are feeling the baby move.  If you have had any abnormal symptoms, such as leaking fluid, bleeding, severe headaches, or abdominal cramping.  If you are using any tobacco products, including cigarettes, chewing   tobacco, and electronic cigarettes.  If you have any questions. Other tests that may be performed during your second trimester include:  Blood tests that check for:  Low iron levels (anemia).  Gestational diabetes (between 24 and 28 weeks).  Rh antibodies.  Urine tests to check for infections, diabetes, or protein in the urine.  An ultrasound to confirm the proper growth and development of the baby.  An amniocentesis to check for possible genetic problems.  Fetal  screens for spina bifida and Down syndrome.  HIV (human immunodeficiency virus) testing. Routine prenatal testing includes screening for HIV, unless you choose not to have this test. HOME CARE INSTRUCTIONS   Avoid all smoking, herbs, alcohol, and unprescribed drugs. These chemicals affect the formation and growth of the baby.  Do not use any tobacco products, including cigarettes, chewing tobacco, and electronic cigarettes. If you need help quitting, ask your health care provider. You may receive counseling support and other resources to help you quit.  Follow your health care provider's instructions regarding medicine use. There are medicines that are either safe or unsafe to take during pregnancy.  Exercise only as directed by your health care provider. Experiencing uterine cramps is a good sign to stop exercising.  Continue to eat regular, healthy meals.  Wear a good support bra for breast tenderness.  Do not use hot tubs, steam rooms, or saunas.  Wear your seat belt at all times when driving.  Avoid raw meat, uncooked cheese, cat litter boxes, and soil used by cats. These carry germs that can cause birth defects in the baby.  Take your prenatal vitamins.  Take 1500-2000 mg of calcium daily starting at the 20th week of pregnancy until you deliver your baby.  Try taking a stool softener (if your health care provider approves) if you develop constipation. Eat more high-fiber foods, such as fresh vegetables or fruit and whole grains. Drink plenty of fluids to keep your urine clear or pale yellow.  Take warm sitz baths to soothe any pain or discomfort caused by hemorrhoids. Use hemorrhoid cream if your health care provider approves.  If you develop varicose veins, wear support hose. Elevate your feet for 15 minutes, 3-4 times a day. Limit salt in your diet.  Avoid heavy lifting, wear low heel shoes, and practice good posture.  Rest with your legs elevated if you have leg cramps or  low back pain.  Visit your dentist if you have not gone yet during your pregnancy. Use a soft toothbrush to brush your teeth and be gentle when you floss.  A sexual relationship may be continued unless your health care provider directs you otherwise.  Continue to go to all your prenatal visits as directed by your health care provider. SEEK MEDICAL CARE IF:   You have dizziness.  You have mild pelvic cramps, pelvic pressure, or nagging pain in the abdominal area.  You have persistent nausea, vomiting, or diarrhea.  You have a bad smelling vaginal discharge.  You have pain with urination. SEEK IMMEDIATE MEDICAL CARE IF:   You have a fever.  You are leaking fluid from your vagina.  You have spotting or bleeding from your vagina.  You have severe abdominal cramping or pain.  You have rapid weight gain or loss.  You have shortness of breath with chest pain.  You notice sudden or extreme swelling of your face, hands, ankles, feet, or legs.  You have not felt your baby move in over an hour.  You have severe   headaches that do not go away with medicine.  You have vision changes.   This information is not intended to replace advice given to you by your health care provider. Make sure you discuss any questions you have with your health care provider.   Document Released: 07/26/2001 Document Revised: 08/22/2014 Document Reviewed: 10/02/2012 Elsevier Interactive Patient Education 2016 [employer]  

## 2016-05-05 LAB — MATERNAL SCREEN, INTEGRATED #2
ADSF: 0.66
AFP MoM: 0.99
Alpha-Fetoprotein: 17.9 ng/mL
Crown Rump Length: 59.7 mm
DIA MoM: 1.43
DIA VALUE: 175.1 pg/mL
Estriol, Unconjugated: 0.47 ng/mL
Gest. Age on Collection Date: 12.4 weeks
Gestational Age: 16.4 weeks
HCG MOM: 0.61
HCG VALUE: 11.3 [IU]/mL
MATERNAL AGE AT EDD: 31.9 a
NUCHAL TRANSLUCENCY (NT): 1.2 mm
NUMBER OF FETUSES: 1
Nuchal Translucency MoM: 0.79
PAPP-A MoM: 0.67
PAPP-A Value: 271.4 ng/mL
Test Results:: NEGATIVE
WEIGHT: 288 [lb_av]
Weight: 288 [lb_av]

## 2016-05-31 ENCOUNTER — Ambulatory Visit (INDEPENDENT_AMBULATORY_CARE_PROVIDER_SITE_OTHER): Payer: Medicaid Other

## 2016-05-31 ENCOUNTER — Ambulatory Visit (INDEPENDENT_AMBULATORY_CARE_PROVIDER_SITE_OTHER): Payer: Medicaid Other | Admitting: Women's Health

## 2016-05-31 VITALS — BP 108/60 | HR 70 | Wt 286.0 lb

## 2016-05-31 DIAGNOSIS — Z3482 Encounter for supervision of other normal pregnancy, second trimester: Secondary | ICD-10-CM

## 2016-05-31 DIAGNOSIS — Z3A21 21 weeks gestation of pregnancy: Secondary | ICD-10-CM

## 2016-05-31 DIAGNOSIS — Z1389 Encounter for screening for other disorder: Secondary | ICD-10-CM

## 2016-05-31 DIAGNOSIS — Z331 Pregnant state, incidental: Secondary | ICD-10-CM

## 2016-05-31 DIAGNOSIS — Z363 Encounter for antenatal screening for malformations: Secondary | ICD-10-CM | POA: Diagnosis not present

## 2016-05-31 LAB — POCT URINALYSIS DIPSTICK
Blood, UA: NEGATIVE
GLUCOSE UA: NEGATIVE
KETONES UA: NEGATIVE
LEUKOCYTES UA: NEGATIVE
Nitrite, UA: NEGATIVE
Protein, UA: NEGATIVE

## 2016-05-31 NOTE — Progress Notes (Signed)
Low-risk OB appointment G2P1001 6587w2d Estimated Date of Delivery: 10/16/16 BP 108/60   Pulse 70   Wt 286 lb (129.7 kg)   LMP 01/10/2016 (Exact Date)   BMI 49.09 kg/m   BP, weight, and urine reviewed.  Refer to obstetrical flow sheet for FH & FHR.  Reports good fm.  Denies regular uc's, lof, vb, or uti s/s. No complaints. Reviewed today's anatomy u/s, limited views, otherwise normal- will repeat @ 28wks. Discussed warning s/s to report, fm Plan:  Continue routine obstetrical care  F/U in 4wks for OB appointment

## 2016-05-31 NOTE — Patient Instructions (Signed)

## 2016-05-31 NOTE — Progress Notes (Signed)
US 20+2 wks,cephalic,cx 4.1 cm,ant pl gr 0,bilat adnexa's wnl,svp of fluid 6 cm,fhr 132 bpm,efw 414 g,limited view of spine because of fetal pos,unable to visualize fetal stomach,please have pt come back for additional images.

## 2016-06-28 ENCOUNTER — Ambulatory Visit (INDEPENDENT_AMBULATORY_CARE_PROVIDER_SITE_OTHER): Payer: Medicaid Other | Admitting: Advanced Practice Midwife

## 2016-06-28 ENCOUNTER — Encounter: Payer: Self-pay | Admitting: Advanced Practice Midwife

## 2016-06-28 VITALS — BP 124/64 | HR 86 | Wt 284.0 lb

## 2016-06-28 DIAGNOSIS — Z3A24 24 weeks gestation of pregnancy: Secondary | ICD-10-CM

## 2016-06-28 DIAGNOSIS — O210 Mild hyperemesis gravidarum: Secondary | ICD-10-CM

## 2016-06-28 DIAGNOSIS — Z1389 Encounter for screening for other disorder: Secondary | ICD-10-CM

## 2016-06-28 DIAGNOSIS — Z363 Encounter for antenatal screening for malformations: Secondary | ICD-10-CM

## 2016-06-28 DIAGNOSIS — J029 Acute pharyngitis, unspecified: Secondary | ICD-10-CM

## 2016-06-28 DIAGNOSIS — Z3403 Encounter for supervision of normal first pregnancy, third trimester: Secondary | ICD-10-CM

## 2016-06-28 DIAGNOSIS — Z331 Pregnant state, incidental: Secondary | ICD-10-CM

## 2016-06-28 LAB — POCT URINALYSIS DIPSTICK
Blood, UA: NEGATIVE
Glucose, UA: NEGATIVE
KETONES UA: NEGATIVE
Nitrite, UA: NEGATIVE
PROTEIN UA: NEGATIVE

## 2016-06-28 MED ORDER — ONDANSETRON HCL 4 MG PO TABS: 4.0000 mg | ORAL_TABLET | Freq: Three times a day (TID) | ORAL | 1 refills | Status: DC | PRN

## 2016-06-28 NOTE — Progress Notes (Signed)
G2P1001 4962w2d Estimated Date of Delivery: 10/16/16  Blood pressure 124/64, pulse 86, weight 284 lb (128.8 kg), last menstrual period 01/10/2016, unknown if currently breastfeeding.   BP weight and urine results all reviewed and noted.  Please refer to the obstetrical flow sheet for the fundal height and fetal heart rate documentation:  Patient reports good fetal movement, denies any bleeding and no rupture of membranes symptoms or regular contractions. Patient still c/o nausea throughout the day and night.  Diclegis made sleepy if took during the day.  All questions were answered.  Orders Placed This Encounter  Procedures  . US OB Follow Up  . POCT urinalysis dipstick    Plan:  Continued routine obstetrical care,   Return in about 3 weeks (around 07/19/2016) for PN2/LROB, US:EFW.

## 2016-06-28 NOTE — Patient Instructions (Signed)

## 2016-06-30 LAB — CULTURE, GROUP A STREP: STREP A CULTURE: NEGATIVE

## 2016-07-20 ENCOUNTER — Encounter: Payer: Medicaid Other | Admitting: Advanced Practice Midwife

## 2016-07-20 ENCOUNTER — Other Ambulatory Visit: Payer: Medicaid Other

## 2016-07-26 ENCOUNTER — Other Ambulatory Visit: Payer: Self-pay | Admitting: Advanced Practice Midwife

## 2016-07-26 DIAGNOSIS — IMO0002 Reserved for concepts with insufficient information to code with codable children: Secondary | ICD-10-CM

## 2016-07-26 DIAGNOSIS — Z0489 Encounter for examination and observation for other specified reasons: Secondary | ICD-10-CM

## 2016-07-27 ENCOUNTER — Other Ambulatory Visit: Payer: Medicaid Other

## 2016-07-27 ENCOUNTER — Ambulatory Visit (INDEPENDENT_AMBULATORY_CARE_PROVIDER_SITE_OTHER): Payer: Medicaid Other

## 2016-07-27 ENCOUNTER — Ambulatory Visit (INDEPENDENT_AMBULATORY_CARE_PROVIDER_SITE_OTHER): Payer: Medicaid Other | Admitting: Advanced Practice Midwife

## 2016-07-27 VITALS — BP 126/80 | HR 84 | Wt 289.0 lb

## 2016-07-27 DIAGNOSIS — O321XX1 Maternal care for breech presentation, fetus 1: Secondary | ICD-10-CM | POA: Diagnosis not present

## 2016-07-27 DIAGNOSIS — Z3A29 29 weeks gestation of pregnancy: Secondary | ICD-10-CM | POA: Diagnosis not present

## 2016-07-27 DIAGNOSIS — Z3483 Encounter for supervision of other normal pregnancy, third trimester: Secondary | ICD-10-CM

## 2016-07-27 DIAGNOSIS — Z3A28 28 weeks gestation of pregnancy: Secondary | ICD-10-CM

## 2016-07-27 DIAGNOSIS — Z1389 Encounter for screening for other disorder: Secondary | ICD-10-CM

## 2016-07-27 DIAGNOSIS — Z131 Encounter for screening for diabetes mellitus: Secondary | ICD-10-CM

## 2016-07-27 DIAGNOSIS — O359XX1 Maternal care for (suspected) fetal abnormality and damage, unspecified, fetus 1: Secondary | ICD-10-CM

## 2016-07-27 DIAGNOSIS — IMO0002 Reserved for concepts with insufficient information to code with codable children: Secondary | ICD-10-CM

## 2016-07-27 DIAGNOSIS — Z3402 Encounter for supervision of normal first pregnancy, second trimester: Secondary | ICD-10-CM

## 2016-07-27 DIAGNOSIS — Z363 Encounter for antenatal screening for malformations: Secondary | ICD-10-CM

## 2016-07-27 DIAGNOSIS — Z0489 Encounter for examination and observation for other specified reasons: Secondary | ICD-10-CM

## 2016-07-27 DIAGNOSIS — Z331 Pregnant state, incidental: Secondary | ICD-10-CM

## 2016-07-27 DIAGNOSIS — Z362 Encounter for other antenatal screening follow-up: Secondary | ICD-10-CM | POA: Diagnosis not present

## 2016-07-27 LAB — POCT URINALYSIS DIPSTICK
GLUCOSE UA: NEGATIVE
Ketones, UA: NEGATIVE
Leukocytes, UA: NEGATIVE
NITRITE UA: NEGATIVE
RBC UA: NEGATIVE

## 2016-07-27 NOTE — Patient Instructions (Signed)
Maternal Fetal Medicine Ultrasound Appointment 12/21 at 2pm  At [facility]

## 2016-07-27 NOTE — Progress Notes (Signed)
G2P1001 7536w3d Estimated Date of Delivery: 10/16/16  Blood pressure 126/80, pulse 84, weight 289 lb (131.1 kg), last menstrual period 01/10/2016, unknown if currently breastfeeding.   BP weight and urine results all reviewed and noted.  Please refer to the obstetrical flow sheet for the fundal height and fetal heart rate documentation:   US 28+3 wks,breech,ant pl gr 0,bilat adnexa's wnl,afi 12.6 cm,efw 1214 g 50%,fhr 136 bpm,limited view of spine and face because of pt body habitus and fetal pos,? bilat cleft lip,results discussed w/ Drenda FreezeFran   Patient reports good fetal movement, denies any bleeding and no rupture of membranes symptoms or regular contractions. Patient is without complaints. All questions were answered.  Orders Placed This Encounter  Procedures  . US MFM OB COMP + 14 WK  . POCT urinalysis dipstick    Plan:  Continued routine obstetrical care, US w/MFM 12/21 at 2pm d/t ? Cleft lip and poor scanning. Due to Amber not being sure about cleft lip, not discussed w/pt   PN2 today   Return in about 3 weeks (around 08/17/2016) for LROB.

## 2016-07-27 NOTE — Progress Notes (Signed)
US 28+3 wks,breech,ant pl gr 0,bilat adnexa's wnl,afi 12.6 cm,efw 1214 g 50%,fhr 136 bpm,limited view of spine and face because of pt body habitus and fetal pos,? bilat cleft lip,results discussed w/ Drenda FreezeFran

## 2016-07-28 LAB — RPR: RPR Ser Ql: NONREACTIVE

## 2016-07-28 LAB — CBC
Hematocrit: 35.4 % (ref 34.0–46.6)
Hemoglobin: 11.6 g/dL (ref 11.1–15.9)
MCH: 29.4 pg (ref 26.6–33.0)
MCHC: 32.8 g/dL (ref 31.5–35.7)
MCV: 90 fL (ref 79–97)
PLATELETS: 347 10*3/uL (ref 150–379)
RBC: 3.94 x10E6/uL (ref 3.77–5.28)
RDW: 14.1 % (ref 12.3–15.4)
WBC: 10.4 10*3/uL (ref 3.4–10.8)

## 2016-07-28 LAB — GLUCOSE TOLERANCE, 2 HOURS W/ 1HR
GLUCOSE, 1 HOUR: 149 mg/dL (ref 65–179)
GLUCOSE, FASTING: 88 mg/dL (ref 65–91)
Glucose, 2 hour: 123 mg/dL (ref 65–152)

## 2016-07-28 LAB — HIV ANTIBODY (ROUTINE TESTING W REFLEX): HIV Screen 4th Generation wRfx: NONREACTIVE

## 2016-07-28 LAB — ANTIBODY SCREEN: ANTIBODY SCREEN: NEGATIVE

## 2016-08-04 ENCOUNTER — Ambulatory Visit (HOSPITAL_COMMUNITY)
Admission: RE | Admit: 2016-08-04 | Discharge: 2016-08-04 | Disposition: A | Discharge status: Home / Self Care | Payer: Medicaid Other | Source: Ambulatory Visit | Attending: Advanced Practice Midwife | Admitting: Advanced Practice Midwife

## 2016-08-04 ENCOUNTER — Other Ambulatory Visit: Payer: Self-pay | Admitting: Advanced Practice Midwife

## 2016-08-04 ENCOUNTER — Encounter (HOSPITAL_COMMUNITY): Payer: Self-pay

## 2016-08-04 ENCOUNTER — Other Ambulatory Visit (HOSPITAL_COMMUNITY): Payer: Self-pay | Admitting: *Deleted

## 2016-08-04 DIAGNOSIS — O99213 Obesity complicating pregnancy, third trimester: Secondary | ICD-10-CM

## 2016-08-04 DIAGNOSIS — Z363 Encounter for antenatal screening for malformations: Secondary | ICD-10-CM

## 2016-08-04 DIAGNOSIS — Z3402 Encounter for supervision of normal first pregnancy, second trimester: Secondary | ICD-10-CM

## 2016-08-04 DIAGNOSIS — O358XX0 Maternal care for other (suspected) fetal abnormality and damage, not applicable or unspecified: Secondary | ICD-10-CM | POA: Insufficient documentation

## 2016-08-04 DIAGNOSIS — R9389 Abnormal findings on diagnostic imaging of other specified body structures: Secondary | ICD-10-CM

## 2016-08-04 DIAGNOSIS — O35AXX0 Maternal care for other (suspected) fetal abnormality and damage, fetal facial anomalies, not applicable or unspecified: Secondary | ICD-10-CM

## 2016-08-04 DIAGNOSIS — O358XX3 Maternal care for other (suspected) fetal abnormality and damage, fetus 3: Secondary | ICD-10-CM | POA: Diagnosis not present

## 2016-08-04 DIAGNOSIS — Z3A29 29 weeks gestation of pregnancy: Secondary | ICD-10-CM | POA: Insufficient documentation

## 2016-08-04 DIAGNOSIS — Q369 Cleft lip, unilateral: Secondary | ICD-10-CM

## 2016-08-04 NOTE — Progress Notes (Addendum)
Genetic Counseling  High-Risk Gestation Note  Appointment Date:  08/04/2016 Referred By: Dorothy Flowers* Date of Birth:  30-Jan-1986   Pregnancy History: G2P1001 Estimated Date of Delivery: 10/16/16 Estimated Gestational Age: 30w4dAttending: MGriffin Dakin MD   I met with [name] for genetic counseling because of abnormal ultrasound findings.   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 Ultrasound performed today visualized likely bilateral fetal cleft lip. Fetal palate was not well visualized and thus, it could not be determined today whether or not palate may also be involved. Remaining visualized fetal anatomy was within normal limits. However, suboptimal heart views were obtained today given gestational age and maternal habitus. Complete ultrasound results under separate cover.   We discussed the ultrasound finding of cleft lip and palate. In normal embryological development, the fetal lip usually closes by 7-[redacted] weeks gestation and the fetal palate usually closes by [redacted] weeks gestation.  When parts of these structures do not fuse properly, cleft lip and/or palate (CL/P) results.  CL/P is twice as common in males as it is in females. Approximately 25% (1 in 4) of all cleft lip and/or palate (CL/P) cases are cleft lip only, 50% (1 in 2) are cleft lip and palate, and 25% (1 in 4) are  cleft palate only.  The incidence of CL/P varies in different ethnic populations; it occurs in approximately 1 in 127,000Caucasian births.  In addition to ethnicity, other factors may increase the chance of a CL/P including some prenatal exposures, alcohol and drug use, cigarette smoking, or folic acid deficiency.  CL/P is most often an isolated condition, but can be present in combination with other birth defects possibly as part of a genetic syndrome. Approximately, 7-13% of individuals with a cleft lip and 11-14% of individuals with a cleft lip and palate are born with additional birth defects.  Many genetic syndromes (> 200) are associated with cleft lip and/or palate which may not be identified on ultrasound and would not be detected by amniocentesis.  For this reason, a genetics evaluation may be recommended sometime after birth in order to assess for an underlying genetic syndrome.  When there is no syndrome as the cause, then the cleft lip or palate is typically suspected to be caused by a combination of genetic and environmental factors (multifactorial inheritance).   We reviewed chromosomes and genes and examples of chromosome conditions. Ms. LLwinpreviously had Integrated Screening which reduced the risks for fetal Down syndrome, trisomy 18 and open neural tube defects to less than 1 in 10,000. We reviewed that maternal serum screens are not diagnostic but rather adjust the a priori risk for conditions to provide a pregnancy specific risk assessment.  We reviewed an additional available screening option, noninvasive prenatal screening (NIPS)/prenatal cell free DNA testing. We reviewed the methodology, the conditions for which it assesses, and the sensitivity and false positive rates of each.  We discussed the option of amniocentesis for karyotype and/or chromosome microarray analysis, including the 1 in [id number] for complications including  spontaneous pregnancy loss.  She understands that  amniocentesis allows evaluation of the fetal chromosomes, but cannot detect all genetic conditions.  Specifically, we discussed that single gene conditions are difficult to diagnose prenatally unless a specific condition is suspected based on additional ultrasound findings or family history. We discussed the risks, limitations, and benefits of each screening and testing option. After thoughtful consideration, [name] declined NIPS and amniocentesis today.  The option of having the baby evaluated by a Medical Geneticist postnatally is also available so that a potential diagnosis for the baby and, ultimately, a recurrence risk for the patient can be made.  Without further information, we may not be able to answer questions related to why this happened or the recurrence risk for future pregnancies.    We discussed the option of meeting with a pediatric plastic and reconstructive surgeon to discuss management and treatment of cleft lip and palate. We discussed that there are physicians from [facility] who provide consultative services in [place]. Ms. Yera planned to further consider this option. Follow-up ultrasound was scheduled for 09/01/16. We provided Ms. Gettel with written resources regarding cleft lip +/- palate.   Both family histories were reviewed and found to be contributory for a paternal uncle with hydrocephalus. He was described to have a shunt placed and mild intellectual disability related to this. He was unable to maintain independent employment. The patient reported no known etiology for the hydrocephalus and reported that he is believed to be deceased given that he has been missing for 7 years. Hydrocephalus can be isolated (nonsyndromic) or seen as one feature of an underlying chromosome or genetic condition. Hydrocephalus is typically isolated and multifactorial, involving a combination of genetic and environmental contributing factors.  Rarely,  nonsyndromic hydrocephalus can follow autosomal recessive or autosomal dominant inheritance. X-linked hydrocephalus is also observed in some families. We discussed that when isolated and when multifactorial inheritance is suspected, recurrence risk for full siblings is approximately 1-2%. Thus, given the reported family history and the degree of relation, recurrence risk for the current pregnancy is likely low. Additional information regarding an underlying cause for hydrocephalus or other medical issues for this relative may alter recurrence risk assessment. Targeted ultrasound is available to assess for features of hydrocephalus prenatally. However, the patient understands that ultrasound cannot diagnose or rule out all birth defects prenatally. Ms. Blough had limited information regarding the father of the pregnancy's extended family history. We thus, cannot comment on how this history may impact the risk for birth defects or genetic conditions in the pregnancy.  Without further information regarding the provided family history, an accurate genetic risk cannot be calculated. Further genetic counseling is warranted if more information is obtained.  Ms. Puneet Selden Burnstein denied exposure to environmental toxins or chemical agents. She denied the use of alcohol or street drugs. She reported smoking approximately an average of one pack of cigarettes over a three day period in the beginning of pregnancy but has discontinued smoking. She denied significant viral illnesses during the course of her pregnancy. Her medical and surgical histories were noncontributory.   I counseled Ms. SYNIAH BERNE regarding the above risks and available options.  The approximate face-to-face time with the genetic counselor was 40 minutes.  Chipper Oman, MS Certified Genetic Counselor 08/04/2016

## 2016-08-17 ENCOUNTER — Encounter: Payer: Self-pay | Admitting: Advanced Practice Midwife

## 2016-08-17 ENCOUNTER — Ambulatory Visit (INDEPENDENT_AMBULATORY_CARE_PROVIDER_SITE_OTHER): Payer: Medicaid Other | Admitting: Advanced Practice Midwife

## 2016-08-17 VITALS — BP 109/61 | HR 100 | Wt 292.0 lb

## 2016-08-17 DIAGNOSIS — Z3A32 32 weeks gestation of pregnancy: Secondary | ICD-10-CM

## 2016-08-17 DIAGNOSIS — O35AXX0 Maternal care for other (suspected) fetal abnormality and damage, fetal facial anomalies, not applicable or unspecified: Secondary | ICD-10-CM

## 2016-08-17 DIAGNOSIS — Z3483 Encounter for supervision of other normal pregnancy, third trimester: Secondary | ICD-10-CM

## 2016-08-17 DIAGNOSIS — Z331 Pregnant state, incidental: Secondary | ICD-10-CM

## 2016-08-17 DIAGNOSIS — Z1389 Encounter for screening for other disorder: Secondary | ICD-10-CM

## 2016-08-17 DIAGNOSIS — O358XX0 Maternal care for other (suspected) fetal abnormality and damage, not applicable or unspecified: Secondary | ICD-10-CM

## 2016-08-17 LAB — POCT URINALYSIS DIPSTICK
Blood, UA: NEGATIVE
Glucose, UA: NEGATIVE
Ketones, UA: NEGATIVE
Nitrite, UA: NEGATIVE
Protein, UA: NEGATIVE

## 2016-08-17 NOTE — Patient Instructions (Signed)
Third Trimester of Pregnancy The third trimester is from week 29 through week 40 (months 7 through 9). The third trimester is a time when the unborn baby (fetus) is growing rapidly. At the end of the ninth month, the fetus is about 20 inches in length and weighs 6-10 pounds. Body changes during your third trimester Your body goes through many changes during pregnancy. The changes vary from woman to woman. During the third trimester:  Your weight will continue to increase. You can expect to gain 25-35 pounds (11-16 kg) by the end of the pregnancy.  You may begin to get stretch marks on your hips, abdomen, and breasts.  You may urinate more often because the fetus is moving lower into your pelvis and pressing on your bladder.  You may develop or continue to have heartburn. This is caused by increased hormones that slow down muscles in the digestive tract.  You may develop or continue to have constipation because increased hormones slow digestion and cause the muscles that push waste through your intestines to relax.  You may develop hemorrhoids. These are swollen veins (varicose veins) in the rectum that can itch or be painful.  You may develop swollen, bulging veins (varicose veins) in your legs.  You may have increased body aches in the pelvis, back, or thighs. This is due to weight gain and increased hormones that are relaxing your joints.  You may have changes in your hair. These can include thickening of your hair, rapid growth, and changes in texture. Some women also have hair loss during or after pregnancy, or hair that feels dry or thin. Your hair will most likely return to normal after your baby is born.  Your breasts will continue to grow and they will continue to become tender. A yellow fluid (colostrum) may leak from your breasts. This is the first milk you are producing for your baby.  Your belly button may stick out.  You may notice more swelling in your hands, face, or  ankles.  You may have increased tingling or numbness in your hands, arms, and legs. The skin on your belly may also feel numb.  You may feel short of breath because of your expanding uterus.  You may have more problems sleeping. This can be caused by the size of your belly, increased need to urinate, and an increase in your body's metabolism.  You may notice the fetus "dropping," or moving lower in your abdomen.  You may have increased vaginal discharge.  Your cervix becomes thin and soft (effaced) near your due date. What to expect at prenatal visits You will have prenatal exams every 2 weeks until week 36. Then you will have weekly prenatal exams. During a routine prenatal visit:  You will be weighed to make sure you and the fetus are growing normally.  Your blood pressure will be taken.  Your abdomen will be measured to track your baby's growth.  The fetal heartbeat will be listened to.  Any test results from the previous visit will be discussed.  You may have a cervical check near your due date to see if you have effaced. At around 36 weeks, your health care provider will check your cervix. At the same time, your health care provider will also perform a test on the secretions of the vaginal tissue. This test is to determine if a type of bacteria, Group B streptococcus, is present. Your health care provider will explain this further. Your health care provider may ask you:    What your birth plan is.  How you are feeling.  If you are feeling the baby move.  If you have had any abnormal symptoms, such as leaking fluid, bleeding, severe headaches, or abdominal cramping.  If you are using any tobacco products, including cigarettes, chewing tobacco, and electronic cigarettes.  If you have any questions. Other tests or screenings that may be performed during your third trimester include:  Blood tests that check for low iron levels (anemia).  Fetal testing to check the health,  activity level, and growth of the fetus. Testing is done if you have certain medical conditions or if there are problems during the pregnancy.  Nonstress test (NST). This test checks the health of your baby to make sure there are no signs of problems, such as the baby not getting enough oxygen. During this test, a belt is placed around your belly. The baby is made to move, and its heart rate is monitored during movement. What is false labor? False labor is a condition in which you feel small, irregular tightenings of the muscles in the womb (contractions) that eventually go away. These are called Braxton Hicks contractions. Contractions may last for hours, days, or even weeks before true labor sets in. If contractions come at regular intervals, become more frequent, increase in intensity, or become painful, you should see your health care provider. What are the signs of labor?  Abdominal cramps.  Regular contractions that start at 10 minutes apart and become stronger and more frequent with time.  Contractions that start on the top of the uterus and spread down to the lower abdomen and back.  Increased pelvic pressure and dull back pain.  A watery or bloody mucus discharge that comes from the vagina.  Leaking of amniotic fluid. This is also known as your "water breaking." It could be a slow trickle or a gush. Let your doctor know if it has a color or strange odor. If you have any of these signs, call your health care provider right away, even if it is before your due date. Follow these instructions at home: Eating and drinking  Continue to eat regular, healthy meals.  Do not eat:  Raw meat or meat spreads.  Unpasteurized milk or cheese.  Unpasteurized juice.  Store-made salad.  Refrigerated smoked seafood.  Hot dogs or deli meat, unless they are piping hot.  More than 6 ounces of albacore tuna a week.  Shark, swordfish, king mackerel, or tile fish.  Store-made salads.  Raw  sprouts, such as mung bean or alfalfa sprouts.  Take prenatal vitamins as told by your health care provider.  Take 1000 mg of calcium daily as told by your health care provider.  If you develop constipation:  Take over-the-counter or prescription medicines.  Drink enough fluid to keep your urine clear or pale yellow.  Eat foods that are high in fiber, such as fresh fruits and vegetables, whole grains, and beans.  Limit foods that are high in fat and processed sugars, such as fried and sweet foods. Activity  Exercise only as directed by your health care provider. Healthy pregnant women should aim for 2 hours and 30 minutes of moderate exercise per week. If you experience any pain or discomfort while exercising, stop.  Avoid heavy lifting.  Do not exercise in extreme heat or humidity, or at high altitudes.  Wear low-heel, comfortable shoes.  Practice good posture.  Do not travel far distances unless it is absolutely necessary and only with the approval   of your health care provider.  Wear your seat belt at all times while in a car, on a bus, or on a plane.  Take frequent breaks and rest with your legs elevated if you have leg cramps or low back pain.  Do not use hot tubs, steam rooms, or saunas.  You may continue to have sex unless your health care provider tells you otherwise. Lifestyle  Do not use any products that contain nicotine or tobacco, such as cigarettes and e-cigarettes. If you need help quitting, ask your health care provider.  Do not drink alcohol.  Do not use any medicinal herbs or unprescribed drugs. These chemicals affect the formation and growth of the baby.  If you develop varicose veins:  Wear support pantyhose or compression stockings as told by your healthcare provider.  Elevate your feet for 15 minutes, 3-4 times a day.  Wear a supportive maternity bra to help with breast tenderness. General instructions  Take over-the-counter and prescription  medicines only as told by your health care provider. There are medicines that are either safe or unsafe to take during pregnancy.  Take warm sitz baths to soothe any pain or discomfort caused by hemorrhoids. Use hemorrhoid cream or witch hazel if your health care provider approves.  Avoid cat litter boxes and soil used by cats. These carry germs that can cause birth defects in the baby. If you have a cat, ask someone to clean the litter box for you.  To prepare for the arrival of your baby:  Take prenatal classes to understand, practice, and ask questions about the labor and delivery.  Make a trial run to the hospital.  Visit the hospital and tour the maternity area.  Arrange for maternity or paternity leave through employers.  Arrange for family and friends to take care of pets while you are in the hospital.  Purchase a rear-facing car seat and make sure you know how to install it in your car.  Pack your hospital bag.  Prepare the baby's nursery. Make sure to remove all pillows and stuffed animals from the baby's crib to prevent suffocation.  Visit your dentist if you have not gone during your pregnancy. Use a soft toothbrush to brush your teeth and be gentle when you floss.  Keep all prenatal follow-up visits as told by your health care provider. This is important. Contact a health care provider if:  You are unsure if you are in labor or if your water has broken.  You become dizzy.  You have mild pelvic cramps, pelvic pressure, or nagging pain in your abdominal area.  You have lower back pain.  You have persistent nausea, vomiting, or diarrhea.  You have an unusual or bad smelling vaginal discharge.  You have pain when you urinate. Get help right away if:  You have a fever.  You are leaking fluid from your vagina.  You have spotting or bleeding from your vagina.  You have severe abdominal pain or cramping.  You have rapid weight loss or weight gain.  You have  shortness of breath with chest pain.  You notice sudden or extreme swelling of your face, hands, ankles, feet, or legs.  Your baby makes fewer than 10 movements in 2 hours.  You have severe headaches that do not go away with medicine.  You have vision changes. Summary  The third trimester is from week 29 through week 40, months 7 through 9. The third trimester is a time when the unborn baby (fetus)   is growing rapidly.  During the third trimester, your discomfort may increase as you and your baby continue to gain weight. You may have abdominal, leg, and back pain, sleeping problems, and an increased need to urinate.  During the third trimester your breasts will keep growing and they will continue to become tender. A yellow fluid (colostrum) may leak from your breasts. This is the first milk you are producing for your baby.  False labor is a condition in which you feel small, irregular tightenings of the muscles in the womb (contractions) that eventually go away. These are called Braxton Hicks contractions. Contractions may last for hours, days, or even weeks before true labor sets in.  Signs of labor can include: abdominal cramps; regular contractions that start at 10 minutes apart and become stronger and more frequent with time; watery or bloody mucus discharge that comes from the vagina; increased pelvic pressure and dull back pain; and leaking of amniotic fluid. This information is not intended to replace advice given to you by your health care provider. Make sure you discuss any questions you have with your health care provider. Document Released: 07/26/2001 Document Revised: 01/07/2016 Document Reviewed: 10/02/2012 Elsevier Interactive Patient Education  2017 [employer]  

## 2016-08-17 NOTE — Progress Notes (Signed)
G2P1001 1927w3d Estimated Date of Delivery: 10/16/16  Blood pressure 109/61, pulse 100, weight 292 lb (132.5 kg), last menstrual period 01/10/2016, unknown if currently breastfeeding.   BP weight and urine results all reviewed and noted.  Please refer to the obstetrical flow sheet for the fundal height and fetal heart rate documentation: MFM did identfiy a biltateral cleft lip, usure of the palate.  Has F/U US 1/18.  Pt declined CfDNA.  Patient reports good fetal movement, denies any bleeding and no rupture of membranes symptoms or regular contractions. Patient is without complaints. All questions were answered.  Orders Placed This Encounter  Procedures  . POCT urinalysis dipstick    Plan:  Continued routine obstetrical care,   Return in about 2 weeks (around 08/31/2016) for LROB.

## 2016-09-01 ENCOUNTER — Ambulatory Visit (HOSPITAL_COMMUNITY)
Admission: RE | Admit: 2016-09-01 | Discharge: 2016-09-01 | Disposition: A | Discharge status: Home / Self Care | Payer: Medicaid Other | Source: Ambulatory Visit | Attending: Advanced Practice Midwife | Admitting: Advanced Practice Midwife

## 2016-09-02 ENCOUNTER — Encounter: Payer: Medicaid Other | Admitting: Women's Health

## 2016-09-09 ENCOUNTER — Ambulatory Visit (HOSPITAL_COMMUNITY)
Admission: RE | Admit: 2016-09-09 | Discharge: 2016-09-09 | Disposition: A | Discharge status: Home / Self Care | Payer: Medicaid Other | Source: Ambulatory Visit | Attending: Advanced Practice Midwife | Admitting: Advanced Practice Midwife

## 2016-09-09 ENCOUNTER — Encounter (HOSPITAL_COMMUNITY): Payer: Self-pay

## 2016-09-09 ENCOUNTER — Other Ambulatory Visit (HOSPITAL_COMMUNITY): Payer: Self-pay | Admitting: Obstetrics and Gynecology

## 2016-09-09 VITALS — BP 111/67 | HR 89 | Wt 295.8 lb

## 2016-09-09 DIAGNOSIS — Z0489 Encounter for examination and observation for other specified reasons: Secondary | ICD-10-CM

## 2016-09-09 DIAGNOSIS — O358XX0 Maternal care for other (suspected) fetal abnormality and damage, not applicable or unspecified: Secondary | ICD-10-CM | POA: Diagnosis not present

## 2016-09-09 DIAGNOSIS — IMO0002 Reserved for concepts with insufficient information to code with codable children: Secondary | ICD-10-CM

## 2016-09-09 DIAGNOSIS — Z362 Encounter for other antenatal screening follow-up: Secondary | ICD-10-CM | POA: Insufficient documentation

## 2016-09-09 DIAGNOSIS — Z3A34 34 weeks gestation of pregnancy: Secondary | ICD-10-CM | POA: Insufficient documentation

## 2016-09-09 DIAGNOSIS — Q369 Cleft lip, unilateral: Secondary | ICD-10-CM

## 2016-09-09 DIAGNOSIS — O99213 Obesity complicating pregnancy, third trimester: Secondary | ICD-10-CM

## 2016-09-09 DIAGNOSIS — O35AXX0 Maternal care for other (suspected) fetal abnormality and damage, fetal facial anomalies, not applicable or unspecified: Secondary | ICD-10-CM

## 2016-09-09 NOTE — Addendum Note (Signed)
Encounter addended by: Heidi DachMelanie A Angeleah Labrake, RN on: 09/09/2016  2:34 PM<BR>    Actions taken: Visit diagnoses modified, Order list changed, Diagnosis association updated

## 2016-09-12 ENCOUNTER — Encounter: Payer: Medicaid Other | Admitting: Women's Health

## 2016-09-15 ENCOUNTER — Encounter: Payer: Self-pay | Admitting: Obstetrics & Gynecology

## 2016-09-15 ENCOUNTER — Ambulatory Visit (INDEPENDENT_AMBULATORY_CARE_PROVIDER_SITE_OTHER): Payer: Medicaid Other | Admitting: Obstetrics & Gynecology

## 2016-09-15 VITALS — BP 120/78 | HR 96 | Wt 292.0 lb

## 2016-09-15 DIAGNOSIS — Z3A35 35 weeks gestation of pregnancy: Secondary | ICD-10-CM

## 2016-09-15 DIAGNOSIS — Z3483 Encounter for supervision of other normal pregnancy, third trimester: Secondary | ICD-10-CM

## 2016-09-15 DIAGNOSIS — O35AXX0 Maternal care for other (suspected) fetal abnormality and damage, fetal facial anomalies, not applicable or unspecified: Secondary | ICD-10-CM

## 2016-09-15 DIAGNOSIS — Z1389 Encounter for screening for other disorder: Secondary | ICD-10-CM

## 2016-09-15 DIAGNOSIS — O358XX0 Maternal care for other (suspected) fetal abnormality and damage, not applicable or unspecified: Secondary | ICD-10-CM

## 2016-09-15 DIAGNOSIS — Z331 Pregnant state, incidental: Secondary | ICD-10-CM

## 2016-09-15 LAB — POCT URINALYSIS DIPSTICK
Glucose, UA: NEGATIVE
Nitrite, UA: NEGATIVE

## 2016-09-15 MED ORDER — PROMETHAZINE HCL 25 MG PO TABS: 25.0000 mg | ORAL_TABLET | Freq: Four times a day (QID) | ORAL | 1 refills | Status: DC | PRN

## 2016-09-15 MED ORDER — SILVER SULFADIAZINE 1 % EX CREA: TOPICAL_CREAM | CUTANEOUS | 11 refills | Status: DC

## 2016-09-15 NOTE — Progress Notes (Signed)
G2P1001 6873w4d Estimated Date of Delivery: 10/16/16  Blood pressure 120/78, pulse 96, weight 292 lb (132.5 kg), last menstrual period 01/10/2016, unknown if currently breastfeeding.   BP weight and urine results all reviewed and noted.  Please refer to the obstetrical flow sheet for the fundal height and fetal heart rate documentation:, fundal height is U+25 cm  Patient reports good fetal movement, denies any bleeding and no rupture of membranes symptoms or regular contractions. Patient is without complaints. All questions were answered.  Orders Placed This Encounter  Procedures  . POCT urinalysis dipstick    Plan:  Continued routine obstetrical care, bilateral cleft lip  Meds ordered this encounter  Medications  . silver sulfADIAZINE (SILVADENE) 1 % cream    Sig: apply BID to areas    Dispense:  50 g    Refill:  11  . promethazine (PHENERGAN) 25 MG tablet    Sig: Take 1 tablet (25 mg total) by mouth every 6 (six) hours as needed for nausea or vomiting.    Dispense:  30 tablet    Refill:  1     No Follow-up on file.

## 2016-09-26 ENCOUNTER — Encounter: Payer: Self-pay | Admitting: Women's Health

## 2016-09-26 ENCOUNTER — Ambulatory Visit (INDEPENDENT_AMBULATORY_CARE_PROVIDER_SITE_OTHER): Payer: Medicaid Other | Admitting: Women's Health

## 2016-09-26 VITALS — BP 132/84 | HR 92 | Wt 292.0 lb

## 2016-09-26 DIAGNOSIS — O99213 Obesity complicating pregnancy, third trimester: Secondary | ICD-10-CM

## 2016-09-26 DIAGNOSIS — O358XX1 Maternal care for other (suspected) fetal abnormality and damage, fetus 1: Secondary | ICD-10-CM

## 2016-09-26 DIAGNOSIS — Z3A37 37 weeks gestation of pregnancy: Secondary | ICD-10-CM

## 2016-09-26 DIAGNOSIS — Z1389 Encounter for screening for other disorder: Secondary | ICD-10-CM

## 2016-09-26 DIAGNOSIS — O3663X1 Maternal care for excessive fetal growth, third trimester, fetus 1: Secondary | ICD-10-CM | POA: Diagnosis not present

## 2016-09-26 DIAGNOSIS — Z3483 Encounter for supervision of other normal pregnancy, third trimester: Secondary | ICD-10-CM

## 2016-09-26 DIAGNOSIS — Z331 Pregnant state, incidental: Secondary | ICD-10-CM

## 2016-09-26 LAB — POCT URINALYSIS DIPSTICK
GLUCOSE UA: NEGATIVE
KETONES UA: NEGATIVE
Nitrite, UA: NEGATIVE
Protein, UA: NEGATIVE
RBC UA: NEGATIVE

## 2016-09-26 NOTE — Progress Notes (Signed)
Low-risk OB appointment G2P1001 6152w1d Estimated Date of Delivery: 10/16/16 BP 132/84   Pulse 92   Wt 292 lb (132.5 kg)   LMP 01/10/2016 (Exact Date)   BMI 50.12 kg/m   BP, weight, and urine reviewed.  Refer to obstetrical flow sheet for FH & FHR.  FH U+29 Reports good fm.  Denies regular uc's, lof, vb, or uti s/s. No complaints. GBS, gc/ct collected SVE per request: 2/th/-2, vtx Reviewed labor s/s, fkc. Plan:  Continue routine obstetrical care  F/U in 1wk for OB appointment  Has appt w/ MFM this Friday for f/u u/s, efw >90% @ 34.5wks, fetus has bilteral cleft lip, planning surgery @ 3mths

## 2016-09-26 NOTE — Patient Instructions (Signed)
Call the office ([phone]) or go to [facility] if:  You begin to have strong, frequent contractions  Your water breaks.  Sometimes it is a big gush of fluid, sometimes it is just a trickle that keeps getting your panties wet or running down your legs  You have vaginal bleeding.  It is normal to have a small amount of spotting if your cervix was checked.   You don't feel your baby moving like normal.  If you don't, get you something to eat and drink and lay down and focus on feeling your baby move.  You should feel at least 10 movements in 2 hours.  If you don't, you should call the office or go to [facility].     Braxton Hicks Contractions Contractions of the uterus can occur throughout pregnancy. Contractions are not always a sign that you are in labor.  WHAT ARE BRAXTON HICKS CONTRACTIONS?  Contractions that occur before labor are called Braxton Hicks contractions, or false labor. Toward the end of pregnancy (32-34 weeks), these contractions can develop more often and may become more forceful. This is not true labor because these contractions do not result in opening (dilatation) and thinning of the cervix. They are sometimes difficult to tell apart from true labor because these contractions can be forceful and people have different pain tolerances. You should not feel embarrassed if you go to the hospital with false labor. Sometimes, the only way to tell if you are in true labor is for your health care provider to look for changes in the cervix. If there are no prenatal problems or other health problems associated with the pregnancy, it is completely safe to be sent home with false labor and await the onset of true labor. HOW CAN YOU TELL THE DIFFERENCE BETWEEN TRUE AND FALSE LABOR? False Labor   The contractions of false labor are usually shorter and not as hard as those of true labor.   The contractions are usually irregular.   The contractions are often felt in the front of  the lower abdomen and in the groin.   The contractions may go away when you walk around or change positions while lying down.   The contractions get weaker and are shorter lasting as time goes on.   The contractions do not usually become progressively stronger, regular, and closer together as with true labor.  True Labor   Contractions in true labor last 30-70 seconds, become very regular, usually become more intense, and increase in frequency.   The contractions do not go away with walking.   The discomfort is usually felt in the top of the uterus and spreads to the lower abdomen and low back.   True labor can be determined by your health care provider with an exam. This will show that the cervix is dilating and getting thinner.  WHAT TO REMEMBER  Keep up with your usual exercises and follow other instructions given by your health care provider.   Take medicines as directed by your health care provider.   Keep your regular prenatal appointments.   Eat and drink lightly if you think you are going into labor.   If Braxton Hicks contractions are making you uncomfortable:   Change your position from lying down or resting to walking, or from walking to resting.   Sit and rest in a tub of warm water.   Drink 2-3 glasses of water. Dehydration may cause these contractions.   Do slow and deep breathing several   times an hour.  WHEN SHOULD I SEEK IMMEDIATE MEDICAL CARE? Seek immediate medical care if:  Your contractions become stronger, more regular, and closer together.   You have fluid leaking or gushing from your vagina.   You have a fever.   You pass blood-tinged mucus.   You have vaginal bleeding.   You have continuous abdominal pain.   You have low back pain that you never had before.   You feel your baby's head pushing down and causing pelvic pressure.   Your baby is not moving as much as it used to.  This information is not intended to  replace advice given to you by your health care provider. Make sure you discuss any questions you have with your health care provider. Document Released: 08/01/2005 Document Revised: 11/23/2015 Document Reviewed: 05/13/2013 Elsevier Interactive Patient Education  2017 Elsevier Inc.  

## 2016-09-28 LAB — GC/CHLAMYDIA PROBE AMP
CHLAMYDIA, DNA PROBE: NEGATIVE
NEISSERIA GONORRHOEAE BY PCR: NEGATIVE

## 2016-09-28 LAB — STREP GP B NAA: Strep Gp B NAA: POSITIVE — AB

## 2016-09-30 ENCOUNTER — Encounter (HOSPITAL_COMMUNITY): Payer: Self-pay

## 2016-09-30 ENCOUNTER — Ambulatory Visit (HOSPITAL_COMMUNITY)
Admission: RE | Admit: 2016-09-30 | Discharge: 2016-09-30 | Disposition: A | Discharge status: Home / Self Care | Payer: Medicaid Other | Source: Ambulatory Visit | Attending: Advanced Practice Midwife | Admitting: Advanced Practice Midwife

## 2016-09-30 ENCOUNTER — Other Ambulatory Visit (HOSPITAL_COMMUNITY): Payer: Self-pay | Admitting: Obstetrics and Gynecology

## 2016-09-30 DIAGNOSIS — O358XX0 Maternal care for other (suspected) fetal abnormality and damage, not applicable or unspecified: Secondary | ICD-10-CM

## 2016-09-30 DIAGNOSIS — O35AXX0 Maternal care for other (suspected) fetal abnormality and damage, fetal facial anomalies, not applicable or unspecified: Secondary | ICD-10-CM

## 2016-09-30 DIAGNOSIS — Z362 Encounter for other antenatal screening follow-up: Secondary | ICD-10-CM

## 2016-09-30 DIAGNOSIS — Z3A37 37 weeks gestation of pregnancy: Secondary | ICD-10-CM | POA: Diagnosis not present

## 2016-09-30 DIAGNOSIS — O99213 Obesity complicating pregnancy, third trimester: Secondary | ICD-10-CM | POA: Diagnosis not present

## 2016-09-30 DIAGNOSIS — O3660X0 Maternal care for excessive fetal growth, unspecified trimester, not applicable or unspecified: Secondary | ICD-10-CM | POA: Insufficient documentation

## 2016-10-03 ENCOUNTER — Ambulatory Visit (INDEPENDENT_AMBULATORY_CARE_PROVIDER_SITE_OTHER): Payer: Medicaid Other | Admitting: Obstetrics and Gynecology

## 2016-10-03 ENCOUNTER — Encounter: Payer: Self-pay | Admitting: Obstetrics and Gynecology

## 2016-10-03 VITALS — BP 104/64 | HR 94 | Wt 293.0 lb

## 2016-10-03 DIAGNOSIS — Z3A38 38 weeks gestation of pregnancy: Secondary | ICD-10-CM | POA: Diagnosis not present

## 2016-10-03 DIAGNOSIS — Z1389 Encounter for screening for other disorder: Secondary | ICD-10-CM

## 2016-10-03 DIAGNOSIS — O35AXX0 Maternal care for other (suspected) fetal abnormality and damage, fetal facial anomalies, not applicable or unspecified: Secondary | ICD-10-CM

## 2016-10-03 DIAGNOSIS — O358XX0 Maternal care for other (suspected) fetal abnormality and damage, not applicable or unspecified: Secondary | ICD-10-CM

## 2016-10-03 DIAGNOSIS — Z331 Pregnant state, incidental: Secondary | ICD-10-CM | POA: Diagnosis not present

## 2016-10-03 DIAGNOSIS — O0993 Supervision of high risk pregnancy, unspecified, third trimester: Secondary | ICD-10-CM

## 2016-10-03 LAB — POCT URINALYSIS DIPSTICK
Blood, UA: NEGATIVE
Glucose, UA: NEGATIVE
Ketones, UA: NEGATIVE
LEUKOCYTES UA: NEGATIVE
NITRITE UA: NEGATIVE
PROTEIN UA: NEGATIVE

## 2016-10-03 NOTE — Progress Notes (Signed)
Patient ID: Dorothy Flowers, female   DOB: 05-13-1986, 31 y.o.   MRN: [id number]  [id number]  Estimated Date of Delivery: 10/16/16 LROB 1837w1d but has multiple concerns over EFW by [facility]. Being followed for bilateral cleft lip, unknown palate status ____  Patient complaints: Had extensive lacerations with vag del of 5 lb infant years ago. Slow healing . Believes she Was told at 36 wk u/s that baby might reach 10+ lb. Most recent u/s 2/16 showed EFW of 9.0 lb., 4090 g with AC >97%ile , as is head. Pt passed GTT at 28 wk. Not repeated . No documented glucosuria Patient reports good fetal movement. She denies any bleeding, rupture of membranes,or regular contractions.  Blood pressure 104/64, pulse 94, weight 293 lb (132.9 kg), last menstrual period 01/10/2016, unknown if currently breastfeeding.   Urine results:notable for none refer to the ob flow sheet for FH and FHR, ,                          Physical Examination: General appearance - alert, well appearing, and in no distress                                      Abdomen - FH 42 cm, U+28 , pt has an atypical pannus, and would LIKELY REQUIRE AN UNUSUAL INCISION LOCATION , SUBUMBILICAL, FOR A CESAREAN.                                                        -FHR 141bpm                                                         soft, nontender, nondistended, no masses or organomegaly                                      Pelvic - VULVA: normal appearing vulva with no masses, tenderness or lesions,      VAGINA: normal appearing vagina with normal color and discharge, no lesions,      CERVIX: normal appearing cervix without discharge or lesions, closed, long, -3, presenting part is well out of pelvis.                                            Questions were answered. Assessment: LROB G2P1001 @ 5937w1d  tx as high risk for decisions on delivery                        LGA INFANT,4090 g not macrosomic yet, but at risk   History of extensive vag lacerations with small infant                        Fetal bilateral cleft lip Plan:  Continued routine obstetrical care.    F/u in 1 week for routine prenatal care, repeat u/s for EFW, further discusssion of delivery method.   By signing my name below, I, Doreatha Martin, attest that this documentation has been prepared under the direction and in the presence of Tilda Burrow, MD. Electronically Signed: Doreatha Martin, ED Scribe. 10/03/16. 9:00 AM.  I personally performed the services described in this documentation, which was SCRIBED in my presence. The recorded information has been reviewed and considered accurate. It has been edited as necessary during review. Tilda Burrow, MD

## 2016-10-05 DIAGNOSIS — O0993 Supervision of high risk pregnancy, unspecified, third trimester: Secondary | ICD-10-CM | POA: Insufficient documentation

## 2016-10-06 ENCOUNTER — Other Ambulatory Visit: Payer: Self-pay | Admitting: Obstetrics and Gynecology

## 2016-10-06 ENCOUNTER — Inpatient Hospital Stay (EMERGENCY_DEPARTMENT_HOSPITAL)
Admission: AD | Admit: 2016-10-06 | Discharge: 2016-10-06 | Disposition: A | Discharge status: Home / Self Care | Payer: Medicaid Other | Source: Ambulatory Visit | Attending: Obstetrics and Gynecology | Admitting: Obstetrics and Gynecology

## 2016-10-06 ENCOUNTER — Inpatient Hospital Stay (HOSPITAL_COMMUNITY): Payer: Medicaid Other

## 2016-10-06 ENCOUNTER — Encounter (HOSPITAL_COMMUNITY): Payer: Self-pay | Admitting: *Deleted

## 2016-10-06 DIAGNOSIS — O368120 Decreased fetal movements, second trimester, not applicable or unspecified: Secondary | ICD-10-CM | POA: Diagnosis not present

## 2016-10-06 DIAGNOSIS — O368130 Decreased fetal movements, third trimester, not applicable or unspecified: Secondary | ICD-10-CM

## 2016-10-06 DIAGNOSIS — Z3A38 38 weeks gestation of pregnancy: Secondary | ICD-10-CM | POA: Insufficient documentation

## 2016-10-06 DIAGNOSIS — O358XX0 Maternal care for other (suspected) fetal abnormality and damage, not applicable or unspecified: Secondary | ICD-10-CM

## 2016-10-06 DIAGNOSIS — Z3689 Encounter for other specified antenatal screening: Secondary | ICD-10-CM

## 2016-10-06 DIAGNOSIS — O3663X3 Maternal care for excessive fetal growth, third trimester, fetus 3: Secondary | ICD-10-CM

## 2016-10-06 DIAGNOSIS — O471 False labor at or after 37 completed weeks of gestation: Secondary | ICD-10-CM | POA: Insufficient documentation

## 2016-10-06 DIAGNOSIS — Q360 Cleft lip, bilateral: Secondary | ICD-10-CM

## 2016-10-06 DIAGNOSIS — O99213 Obesity complicating pregnancy, third trimester: Secondary | ICD-10-CM

## 2016-10-06 DIAGNOSIS — O479 False labor, unspecified: Secondary | ICD-10-CM

## 2016-10-06 DIAGNOSIS — O368190 Decreased fetal movements, unspecified trimester, not applicable or unspecified: Secondary | ICD-10-CM

## 2016-10-06 DIAGNOSIS — O3660X0 Maternal care for excessive fetal growth, unspecified trimester, not applicable or unspecified: Secondary | ICD-10-CM | POA: Insufficient documentation

## 2016-10-06 NOTE — MAU Provider Note (Signed)
S:  [name] is a 31 y.o. female G2P1001 @ 6651w4d here in MAU with contractions and decreased fetal movement. She thinks she lost her mucus blood. Patient was checked in the office recently and was told she was 2-3 cm.   O:  GENERAL: Well-developed, well-nourished female in no acute distress.  LUNGS: Effort normal SKIN: Warm, dry and without erythema ABDOMEN: Soft  PSYCH: Normal mood and affect  Vitals:   10/06/16 2030  BP: 116/70  Pulse: 106  Resp: 20  Temp: 99 F (37.2 C)    MDM:  Fetal Tracing: Baseline: 120 bpm Variability: Moderate  Accelerations: 15x15 Decelerations: None Toco: Irregular.   Dilation: 2.5 Effacement (%): 50 Cervical Position: Posterior Station: Ballotable Exam by:: Rasch  Discussed and reviewed reactive fetal tracing with the patient and family, both are still very concerned about the fetal movements. "I barely felt my baby move the whole time I was here". She felt one movement that was documented on the fetal tracing. Will send for BPP for reassurance.  Thressa ShellerHeather Hogan CNM to resume care of the patient @ 2150. Patient awaiting US.   BPP: 8/8  Assessment/Plan:  1. Decreased fetal movements in second trimester, single or unspecified fetus   2. False labor   3. Fetal heart rate reactive   4. Decreased fetal movement   5. [redacted] weeks gestation of pregnancy   6. NST (non-stress test) reactive    DC home Comfort measures reviewed  3rd Trimester precautions  labor precautions  Fetal kick counts RX: none  Return to MAU as needed FU with OB as planned  Follow-up Information    [facility].   Specialty:  Obstetrics and Gynecology Contact information: [address] [place] [id number] [phone]         Tawnya CrookHogan, Heather Donovan  10:52 PM 10/06/16   Duane LopeJennifer I Rasch, NP

## 2016-10-06 NOTE — Discharge Instructions (Signed)
Introduction Patient Name: ________________________________________________ Patient Due Date: ____________________ What is a fetal movement count? A fetal movement count is the number of times that you feel your baby move during a certain amount of time. This may also be called a fetal kick count. A fetal movement count is recommended for every pregnant woman. You may be asked to start counting fetal movements as early as week 28 of your pregnancy. Pay attention to when your baby is most active. You may notice your baby's sleep and wake cycles. You may also notice things that make your baby move more. You should do a fetal movement count:  When your baby is normally most active.  At the same time each day. A good time to count movements is while you are resting, after having something to eat and drink. How do I count fetal movements? 1. Find a quiet, comfortable area. Sit, or lie down on your side. 2. Write down the date, the start time and stop time, and the number of movements that you felt between those two times. Take this information with you to your health care visits. 3. For 2 hours, count kicks, flutters, swishes, rolls, and jabs. You should feel at least 10 movements during 2 hours. 4. You may stop counting after you have felt 10 movements. 5. If you do not feel 10 movements in 2 hours, have something to eat and drink. Then, keep resting and counting for 1 hour. If you feel at least 4 movements during that hour, you may stop counting. Contact a health care provider if:  You feel fewer than 4 movements in 2 hours.  Your baby is not moving like he or she usually does. Date: ____________ Start time: ____________ Stop time: ____________ Movements: ____________ Date: ____________ Start time: ____________ Stop time: ____________ Movements: ____________ Date: ____________ Start time: ____________ Stop time: ____________ Movements: ____________ Date: ____________ Start time: ____________  Stop time: ____________ Movements: ____________ Date: ____________ Start time: ____________ Stop time: ____________ Movements: ____________ Date: ____________ Start time: ____________ Stop time: ____________ Movements: ____________ Date: ____________ Start time: ____________ Stop time: ____________ Movements: ____________ Date: ____________ Start time: ____________ Stop time: ____________ Movements: ____________ Date: ____________ Start time: ____________ Stop time: ____________ Movements: ____________ This information is not intended to replace advice given to you by your health care provider. Make sure you discuss any questions you have with your health care provider. Document Released: 08/31/2006 Document Revised: 03/30/2016 Document Reviewed: 09/10/2015 Elsevier Interactive Patient Education  2017 Elsevier Inc.  

## 2016-10-06 NOTE — Progress Notes (Signed)
Pt said she felt the baby move "barely"

## 2016-10-06 NOTE — MAU Note (Addendum)
PT  SAYS   HER MUCUS  PLUG  CAME  OUT   AND  FEELING  SHARP IN BACK -  CONSTANT   AND  LOWER ABD  -   COME / GOES.    PNC-  FAMILY TREE-    VE - THINKS  3  CM.        PLAN  IS   FOR  C/S  DUE  TO  LAST  DEL .        GBS- POSITIVE   LAST  MOVEMENT  -   630PM

## 2016-10-06 NOTE — MAU Note (Signed)
Unable to reach cervix.

## 2016-10-07 ENCOUNTER — Encounter (HOSPITAL_COMMUNITY)
Admission: AD | Disposition: A | Discharge status: Home / Self Care | Payer: Self-pay | Source: Ambulatory Visit | Attending: Obstetrics and Gynecology

## 2016-10-07 ENCOUNTER — Inpatient Hospital Stay (HOSPITAL_COMMUNITY): Payer: Medicaid Other | Admitting: Anesthesiology

## 2016-10-07 ENCOUNTER — Encounter (HOSPITAL_COMMUNITY): Payer: Self-pay | Admitting: Anesthesiology

## 2016-10-07 ENCOUNTER — Inpatient Hospital Stay (HOSPITAL_COMMUNITY)
Admission: AD | Admit: 2016-10-07 | Discharge: 2016-10-10 | DRG: 765 | Disposition: A | Discharge status: Home / Self Care | Payer: Medicaid Other | Source: Ambulatory Visit | Attending: Obstetrics and Gynecology | Admitting: Obstetrics and Gynecology

## 2016-10-07 DIAGNOSIS — Z3A38 38 weeks gestation of pregnancy: Secondary | ICD-10-CM | POA: Diagnosis not present

## 2016-10-07 DIAGNOSIS — O3663X0 Maternal care for excessive fetal growth, third trimester, not applicable or unspecified: Secondary | ICD-10-CM | POA: Diagnosis present

## 2016-10-07 DIAGNOSIS — O4292 Full-term premature rupture of membranes, unspecified as to length of time between rupture and onset of labor: Secondary | ICD-10-CM | POA: Diagnosis present

## 2016-10-07 DIAGNOSIS — Z8249 Family history of ischemic heart disease and other diseases of the circulatory system: Secondary | ICD-10-CM

## 2016-10-07 DIAGNOSIS — Z87891 Personal history of nicotine dependence: Secondary | ICD-10-CM

## 2016-10-07 DIAGNOSIS — O99214 Obesity complicating childbirth: Secondary | ICD-10-CM | POA: Diagnosis present

## 2016-10-07 DIAGNOSIS — L02229 Furuncle of trunk, unspecified: Secondary | ICD-10-CM | POA: Diagnosis not present

## 2016-10-07 DIAGNOSIS — O99824 Streptococcus B carrier state complicating childbirth: Secondary | ICD-10-CM | POA: Diagnosis present

## 2016-10-07 DIAGNOSIS — O479 False labor, unspecified: Secondary | ICD-10-CM | POA: Diagnosis not present

## 2016-10-07 DIAGNOSIS — Z823 Family history of stroke: Secondary | ICD-10-CM

## 2016-10-07 DIAGNOSIS — Z6841 Body Mass Index (BMI) 40.0 and over, adult: Secondary | ICD-10-CM | POA: Diagnosis not present

## 2016-10-07 DIAGNOSIS — Z833 Family history of diabetes mellitus: Secondary | ICD-10-CM

## 2016-10-07 DIAGNOSIS — O368120 Decreased fetal movements, second trimester, not applicable or unspecified: Secondary | ICD-10-CM | POA: Diagnosis not present

## 2016-10-07 DIAGNOSIS — O4202 Full-term premature rupture of membranes, onset of labor within 24 hours of rupture: Secondary | ICD-10-CM

## 2016-10-07 LAB — MRSA PCR SCREENING: MRSA BY PCR: NEGATIVE

## 2016-10-07 LAB — CBC
HCT: 34.1 % — ABNORMAL LOW (ref 36.0–46.0)
HEMOGLOBIN: 11.7 g/dL — AB (ref 12.0–15.0)
MCH: 29.3 pg (ref 26.0–34.0)
MCHC: 34.3 g/dL (ref 30.0–36.0)
MCV: 85.5 fL (ref 78.0–100.0)
Platelets: 295 10*3/uL (ref 150–400)
RBC: 3.99 MIL/uL (ref 3.87–5.11)
RDW: 14.2 % (ref 11.5–15.5)
WBC: 11.6 10*3/uL — AB (ref 4.0–10.5)

## 2016-10-07 LAB — TYPE AND SCREEN
ABO/RH(D): O POS
ANTIBODY SCREEN: NEGATIVE

## 2016-10-07 SURGERY — Surgical Case
Anesthesia: Monitor Anesthesia Care

## 2016-10-07 MED ORDER — NALBUPHINE HCL 10 MG/ML IJ SOLN: 5.0000 mg | INTRAMUSCULAR | Status: DC | PRN

## 2016-10-07 MED ORDER — FENTANYL CITRATE (PF) 100 MCG/2ML IJ SOLN
INTRAMUSCULAR | Status: DC | PRN
  Administered 2016-10-07: 10 ug via INTRATHECAL

## 2016-10-07 MED ORDER — DEXAMETHASONE SODIUM PHOSPHATE 4 MG/ML IJ SOLN
INTRAMUSCULAR | Status: AC
  Filled 2016-10-07: qty 1

## 2016-10-07 MED ORDER — SODIUM CHLORIDE 0.9% FLUSH: 3.0000 mL | INTRAVENOUS | Status: DC | PRN

## 2016-10-07 MED ORDER — PHENYLEPHRINE 40 MCG/ML (10ML) SYRINGE FOR IV PUSH (FOR BLOOD PRESSURE SUPPORT)
PREFILLED_SYRINGE | INTRAVENOUS | Status: DC | PRN
  Administered 2016-10-07 (×3): 80 ug via INTRAVENOUS

## 2016-10-07 MED ORDER — MEPERIDINE HCL 25 MG/ML IJ SOLN: 6.2500 mg | INTRAMUSCULAR | Status: DC | PRN

## 2016-10-07 MED ORDER — NALOXONE HCL 2 MG/2ML IJ SOSY
1.0000 ug/kg/h | PREFILLED_SYRINGE | INTRAVENOUS | Status: DC | PRN
  Filled 2016-10-07: qty 2

## 2016-10-07 MED ORDER — KETOROLAC TROMETHAMINE 30 MG/ML IJ SOLN
INTRAMUSCULAR | Status: AC
Start: 2016-10-07 — End: 2016-10-07
  Filled 2016-10-07: qty 1

## 2016-10-07 MED ORDER — OXYTOCIN 10 UNIT/ML IJ SOLN
INTRAMUSCULAR | Status: AC
  Filled 2016-10-07: qty 4

## 2016-10-07 MED ORDER — DEXAMETHASONE SODIUM PHOSPHATE 4 MG/ML IJ SOLN
INTRAMUSCULAR | Status: DC | PRN
  Administered 2016-10-07: 4 mg via INTRAVENOUS

## 2016-10-07 MED ORDER — LACTATED RINGERS IV SOLN
INTRAVENOUS | Status: DC | PRN
  Administered 2016-10-07: 40 [IU] via INTRAVENOUS

## 2016-10-07 MED ORDER — ACETAMINOPHEN 325 MG PO TABS: 650.0000 mg | ORAL_TABLET | ORAL | Status: DC | PRN

## 2016-10-07 MED ORDER — FAMOTIDINE IN NACL 20-0.9 MG/50ML-% IV SOLN
20.0000 mg | Freq: Once | INTRAVENOUS | Status: AC
  Administered 2016-10-07: 20 mg via INTRAVENOUS
  Filled 2016-10-07: qty 50

## 2016-10-07 MED ORDER — LACTATED RINGERS IV SOLN
INTRAVENOUS | Status: DC
  Administered 2016-10-07 (×4): via INTRAVENOUS

## 2016-10-07 MED ORDER — LACTATED RINGERS IV SOLN
INTRAVENOUS | Status: DC
  Administered 2016-10-07: 15:00:00 via INTRAVENOUS

## 2016-10-07 MED ORDER — TETANUS-DIPHTH-ACELL PERTUSSIS 5-2.5-18.5 LF-MCG/0.5 IM SUSP
0.5000 mL | Freq: Once | INTRAMUSCULAR | Status: AC
  Administered 2016-10-08: 0.5 mL via INTRAMUSCULAR
  Filled 2016-10-07: qty 0.5

## 2016-10-07 MED ORDER — SOD CITRATE-CITRIC ACID 500-334 MG/5ML PO SOLN
30.0000 mL | Freq: Once | ORAL | Status: AC
  Administered 2016-10-07: 30 mL via ORAL
  Filled 2016-10-07: qty 15

## 2016-10-07 MED ORDER — OXYCODONE-ACETAMINOPHEN 5-325 MG PO TABS
2.0000 | ORAL_TABLET | ORAL | Status: DC | PRN
  Administered 2016-10-08 – 2016-10-09 (×2): 2 via ORAL
  Filled 2016-10-07 (×2): qty 2

## 2016-10-07 MED ORDER — DIBUCAINE 1 % RE OINT: 1.0000 "application " | TOPICAL_OINTMENT | RECTAL | Status: DC | PRN

## 2016-10-07 MED ORDER — MENTHOL 3 MG MT LOZG: 1.0000 | LOZENGE | OROMUCOSAL | Status: DC | PRN

## 2016-10-07 MED ORDER — HYDROMORPHONE HCL 1 MG/ML IJ SOLN
0.2500 mg | INTRAMUSCULAR | Status: DC | PRN
Start: 2016-10-07 — End: 2016-10-07

## 2016-10-07 MED ORDER — PHENYLEPHRINE 8 MG IN D5W 100 ML (0.08MG/ML) PREMIX OPTIME
INJECTION | INTRAVENOUS | Status: AC
  Filled 2016-10-07: qty 100

## 2016-10-07 MED ORDER — MORPHINE SULFATE-NACL 0.5-0.9 MG/ML-% IV SOSY
PREFILLED_SYRINGE | INTRAVENOUS | Status: DC | PRN
  Administered 2016-10-07: .2 mg via INTRATHECAL

## 2016-10-07 MED ORDER — PHENYLEPHRINE 8 MG IN D5W 100 ML (0.08MG/ML) PREMIX OPTIME
INJECTION | INTRAVENOUS | Status: DC | PRN
  Administered 2016-10-07: 60 ug/min via INTRAVENOUS

## 2016-10-07 MED ORDER — DIPHENHYDRAMINE HCL 25 MG PO CAPS: 25.0000 mg | ORAL_CAPSULE | Freq: Four times a day (QID) | ORAL | Status: DC | PRN

## 2016-10-07 MED ORDER — PROMETHAZINE HCL 25 MG/ML IJ SOLN: 6.2500 mg | INTRAMUSCULAR | Status: DC | PRN

## 2016-10-07 MED ORDER — KETOROLAC TROMETHAMINE 30 MG/ML IJ SOLN: 30.0000 mg | Freq: Four times a day (QID) | INTRAMUSCULAR | Status: AC | PRN

## 2016-10-07 MED ORDER — SIMETHICONE 80 MG PO CHEW
80.0000 mg | CHEWABLE_TABLET | Freq: Three times a day (TID) | ORAL | Status: DC
  Administered 2016-10-07 – 2016-10-09 (×6): 80 mg via ORAL
  Filled 2016-10-07 (×7): qty 1

## 2016-10-07 MED ORDER — BUPIVACAINE IN DEXTROSE 0.75-8.25 % IT SOLN
INTRATHECAL | Status: DC | PRN
  Administered 2016-10-07: 12 mg via INTRATHECAL

## 2016-10-07 MED ORDER — WITCH HAZEL-GLYCERIN EX PADS: 1.0000 "application " | MEDICATED_PAD | CUTANEOUS | Status: DC | PRN

## 2016-10-07 MED ORDER — SENNOSIDES-DOCUSATE SODIUM 8.6-50 MG PO TABS
2.0000 | ORAL_TABLET | ORAL | Status: DC
  Administered 2016-10-07 – 2016-10-10 (×3): 2 via ORAL
  Filled 2016-10-07 (×3): qty 2

## 2016-10-07 MED ORDER — SIMETHICONE 80 MG PO CHEW
80.0000 mg | CHEWABLE_TABLET | ORAL | Status: DC
  Administered 2016-10-07 – 2016-10-10 (×3): 80 mg via ORAL
  Filled 2016-10-07 (×3): qty 1

## 2016-10-07 MED ORDER — MORPHINE SULFATE (PF) 0.5 MG/ML IJ SOLN
INTRAMUSCULAR | Status: AC
  Filled 2016-10-07: qty 10

## 2016-10-07 MED ORDER — NALBUPHINE HCL 10 MG/ML IJ SOLN: 5.0000 mg | Freq: Once | INTRAMUSCULAR | Status: DC | PRN

## 2016-10-07 MED ORDER — KETOROLAC TROMETHAMINE 30 MG/ML IJ SOLN
30.0000 mg | Freq: Four times a day (QID) | INTRAMUSCULAR | Status: AC | PRN
  Administered 2016-10-07: 30 mg via INTRAMUSCULAR

## 2016-10-07 MED ORDER — DIPHENHYDRAMINE HCL 25 MG PO CAPS
25.0000 mg | ORAL_CAPSULE | ORAL | Status: DC | PRN
  Filled 2016-10-07: qty 1

## 2016-10-07 MED ORDER — ZOLPIDEM TARTRATE 5 MG PO TABS: 5.0000 mg | ORAL_TABLET | Freq: Every evening | ORAL | Status: DC | PRN

## 2016-10-07 MED ORDER — ONDANSETRON HCL 4 MG/2ML IJ SOLN
INTRAMUSCULAR | Status: DC | PRN
  Administered 2016-10-07: 4 mg via INTRAVENOUS

## 2016-10-07 MED ORDER — PRENATAL MULTIVITAMIN CH
1.0000 | ORAL_TABLET | Freq: Every day | ORAL | Status: DC
  Administered 2016-10-07 – 2016-10-09 (×3): 1 via ORAL
  Filled 2016-10-07 (×3): qty 1

## 2016-10-07 MED ORDER — PHENYLEPHRINE 40 MCG/ML (10ML) SYRINGE FOR IV PUSH (FOR BLOOD PRESSURE SUPPORT)
PREFILLED_SYRINGE | INTRAVENOUS | Status: AC
  Filled 2016-10-07: qty 10

## 2016-10-07 MED ORDER — DIPHENHYDRAMINE HCL 50 MG/ML IJ SOLN: 12.5000 mg | INTRAMUSCULAR | Status: DC | PRN

## 2016-10-07 MED ORDER — SCOPOLAMINE 1 MG/3DAYS TD PT72
1.0000 | MEDICATED_PATCH | Freq: Once | TRANSDERMAL | Status: DC
  Filled 2016-10-07: qty 1

## 2016-10-07 MED ORDER — OXYTOCIN 40 UNITS IN LACTATED RINGERS INFUSION - SIMPLE MED: 2.5000 [IU]/h | INTRAVENOUS | Status: AC

## 2016-10-07 MED ORDER — SCOPOLAMINE 1 MG/3DAYS TD PT72
MEDICATED_PATCH | TRANSDERMAL | Status: AC
  Filled 2016-10-07: qty 1

## 2016-10-07 MED ORDER — SIMETHICONE 80 MG PO CHEW: 80.0000 mg | CHEWABLE_TABLET | ORAL | Status: DC | PRN

## 2016-10-07 MED ORDER — GENTAMICIN SULFATE 40 MG/ML IJ SOLN
INTRAVENOUS | Status: AC
  Administered 2016-10-07: 117 mL via INTRAVENOUS
  Filled 2016-10-07: qty 11

## 2016-10-07 MED ORDER — ONDANSETRON HCL 4 MG/2ML IJ SOLN: 4.0000 mg | Freq: Three times a day (TID) | INTRAMUSCULAR | Status: DC | PRN

## 2016-10-07 MED ORDER — SOD CITRATE-CITRIC ACID 500-334 MG/5ML PO SOLN: 30.0000 mL | ORAL | Status: DC

## 2016-10-07 MED ORDER — COCONUT OIL OIL: 1.0000 "application " | TOPICAL_OIL | Status: DC | PRN

## 2016-10-07 MED ORDER — FENTANYL CITRATE (PF) 100 MCG/2ML IJ SOLN
INTRAMUSCULAR | Status: AC
  Filled 2016-10-07: qty 2

## 2016-10-07 MED ORDER — IBUPROFEN 600 MG PO TABS
600.0000 mg | ORAL_TABLET | Freq: Four times a day (QID) | ORAL | Status: DC
  Administered 2016-10-07 – 2016-10-10 (×12): 600 mg via ORAL
  Filled 2016-10-07 (×12): qty 1

## 2016-10-07 MED ORDER — ONDANSETRON HCL 4 MG/2ML IJ SOLN
INTRAMUSCULAR | Status: AC
  Filled 2016-10-07: qty 2

## 2016-10-07 MED ORDER — NALBUPHINE HCL 10 MG/ML IJ SOLN
INTRAMUSCULAR | Status: AC
  Filled 2016-10-07: qty 1

## 2016-10-07 MED ORDER — OXYCODONE-ACETAMINOPHEN 5-325 MG PO TABS
1.0000 | ORAL_TABLET | ORAL | Status: DC | PRN
  Administered 2016-10-08 – 2016-10-09 (×2): 1 via ORAL
  Filled 2016-10-07 (×2): qty 1

## 2016-10-07 MED ORDER — NALOXONE HCL 0.4 MG/ML IJ SOLN: 0.4000 mg | INTRAMUSCULAR | Status: DC | PRN

## 2016-10-07 SURGICAL SUPPLY — 44 items
APL SKNCLS STERI-STRIP NONHPOA (GAUZE/BANDAGES/DRESSINGS) ×1
BENZOIN TINCTURE PRP APPL 2/3 (GAUZE/BANDAGES/DRESSINGS) ×3 IMPLANT
CHLORAPREP W/TINT 26ML (MISCELLANEOUS) ×3 IMPLANT
CLAMP CORD UMBIL (MISCELLANEOUS) IMPLANT
CLOSURE WOUND 1/2 X4 (GAUZE/BANDAGES/DRESSINGS) ×1
CLOTH BEACON ORANGE TIMEOUT ST (SAFETY) ×3 IMPLANT
DRAPE C SECTION CLR SCREEN (DRAPES) ×3 IMPLANT
DRSG OPSITE POSTOP 4X10 (GAUZE/BANDAGES/DRESSINGS) ×3 IMPLANT
ELECT REM PT RETURN 9FT ADLT (ELECTROSURGICAL) ×3
ELECTRODE REM PT RTRN 9FT ADLT (ELECTROSURGICAL) ×1 IMPLANT
EXTRACTOR VACUUM M CUP 4 TUBE (SUCTIONS) IMPLANT
EXTRACTOR VACUUM M CUP 4' TUBE (SUCTIONS)
GLOVE BIO SURGEON STRL SZ7.5 (GLOVE) ×3 IMPLANT
GLOVE BIOGEL PI IND STRL 7.0 (GLOVE) ×1 IMPLANT
GLOVE BIOGEL PI INDICATOR 7.0 (GLOVE) ×2
GOWN STRL REUS W/TWL 2XL LVL3 (GOWN DISPOSABLE) ×3 IMPLANT
GOWN STRL REUS W/TWL LRG LVL3 (GOWN DISPOSABLE) ×6 IMPLANT
KIT ABG SYR 3ML LUER SLIP (SYRINGE) IMPLANT
LIQUID BAND (GAUZE/BANDAGES/DRESSINGS) ×2 IMPLANT
NDL HYPO 25X5/8 SAFETYGLIDE (NEEDLE) IMPLANT
NEEDLE HYPO 22GX1.5 SAFETY (NEEDLE) ×3 IMPLANT
NEEDLE HYPO 25X5/8 SAFETYGLIDE (NEEDLE) IMPLANT
NS IRRIG 1000ML POUR BTL (IV SOLUTION) ×3 IMPLANT
PACK C SECTION WH (CUSTOM PROCEDURE TRAY) ×3 IMPLANT
PAD OB MATERNITY 4.3X12.25 (PERSONAL CARE ITEMS) ×3 IMPLANT
PENCIL SMOKE EVAC W/HOLSTER (ELECTROSURGICAL) ×3 IMPLANT
RTRCTR C-SECT PINK 25CM LRG (MISCELLANEOUS) ×3 IMPLANT
SPONGE LAP 18X18 X RAY DECT (DISPOSABLE) ×4 IMPLANT
STRIP CLOSURE SKIN 1/2X4 (GAUZE/BANDAGES/DRESSINGS) ×2 IMPLANT
SUT CHROMIC 1 CTX 36 (SUTURE) ×6 IMPLANT
SUT VIC AB 1 CT1 27 (SUTURE) ×6
SUT VIC AB 1 CT1 27XBRD ANTBC (SUTURE) ×2 IMPLANT
SUT VIC AB 2-0 CT1 (SUTURE) ×3 IMPLANT
SUT VIC AB 2-0 CT1 27 (SUTURE) ×3
SUT VIC AB 2-0 CT1 TAPERPNT 27 (SUTURE) ×1 IMPLANT
SUT VIC AB 3-0 CT1 27 (SUTURE) ×6
SUT VIC AB 3-0 CT1 TAPERPNT 27 (SUTURE) ×2 IMPLANT
SUT VIC AB 3-0 SH 27 (SUTURE)
SUT VIC AB 3-0 SH 27X BRD (SUTURE) IMPLANT
SUT VIC AB 4-0 KS 27 (SUTURE) ×3 IMPLANT
SYR BULB IRRIGATION 50ML (SYRINGE) ×3 IMPLANT
SYR CONTROL 10ML LL (SYRINGE) ×3 IMPLANT
TOWEL OR 17X24 6PK STRL BLUE (TOWEL DISPOSABLE) ×3 IMPLANT
TRAY FOLEY CATH SILVER 14FR (SET/KITS/TRAYS/PACK) ×3 IMPLANT

## 2016-10-07 NOTE — Lactation Note (Signed)
This note was copied from a baby's chart. Lactation Consultation Note LC requested d/t needing special needs feeder for double cleft lip and palate. Similac used until RN gets order for Isomil soy milk per request mom and grandmother.  Special needs feeder put on zero flow. Baby chewed and moved tongue around w/nipple in mouth. Baby wants to feeder touching face. Baby's upper gums exposed w/a small flap of outer skin right under mid nose septum.  Reviewed w/mom who is very quiet w/no comment. [facility] bedside [facility]. Demonstrated special feeder to mom and MGM.  Baby didn't suckle but once, bite the nipple a couple of times.  Speech will f/u with baby as well as MD consult for feeding.  Patient Name: Dorothy Flowers ZOXWR'UToday's Date: 10/07/2016 Reason for consult: Initial assessment;Other (Comment) (double cleft palate and lip)   Maternal Data    Feeding Feeding Type: Formula  LATCH Score/Interventions                      Lactation Tools Discussed/Used     Consult Status      Amish Mintzer G 10/07/2016, 7:21 AM

## 2016-10-07 NOTE — Anesthesia Preprocedure Evaluation (Signed)
Anesthesia Evaluation  Patient identified by MRN, date of birth, ID band Patient awake    Reviewed: Allergy & Precautions, H&P , NPO status , Patient's Chart, lab work & pertinent test results  History of Anesthesia Complications Negative for: history of anesthetic complications  Airway Mallampati: III  TM Distance: >3 FB Neck ROM: full    Dental no notable dental hx.    Pulmonary asthma , former smoker,    Pulmonary exam normal        Cardiovascular negative cardio ROS Normal cardiovascular exam     Neuro/Psych negative neurological ROS  negative psych ROS   GI/Hepatic negative GI ROS, Neg liver ROS,   Endo/Other  Morbid obesity  Renal/GU negative Renal ROS     Musculoskeletal   Abdominal (+) + obese,   Peds  Hematology negative hematology ROS (+)   Anesthesia Other Findings   Reproductive/Obstetrics (+) Pregnancy                             Anesthesia Physical  Anesthesia Plan  ASA: III  Anesthesia Plan: Spinal and MAC   Post-op Pain Management:    Induction:   Airway Management Planned: Natural Airway  Additional Equipment:   Intra-op Plan:   Post-operative Plan:   Informed Consent: I have reviewed the patients History and Physical, chart, labs and discussed the procedure including the risks, benefits and alternatives for the proposed anesthesia with the patient or authorized representative who has indicated his/her understanding and acceptance.     Plan Discussed with:   Anesthesia Plan Comments:         Anesthesia Quick Evaluation

## 2016-10-07 NOTE — Addendum Note (Signed)
Addendum  created 10/07/16 1421 by Elgie CongoNataliya H Norvin Ohlin, CRNA   Sign clinical note

## 2016-10-07 NOTE — Transfer of Care (Signed)
Immediate Anesthesia Transfer of Care Note  Patient: Dorothy Flowers  Procedure(s) Performed: Procedure(s): CESAREAN SECTION (N/A)  Patient Location: PACU  Anesthesia Type:Spinal  Level of Consciousness: awake, alert  and oriented  Airway & Oxygen Therapy: Patient Spontanous Breathing  Post-op Assessment: Report given to RN and Post -op Vital signs reviewed and stable  Post vital signs: Reviewed and stable  Last Vitals:  Vitals:   10/07/16 0146  BP: 118/89  Pulse: 113  Resp: 19  Temp: 36.9 C    Last Pain:  Vitals:   10/07/16 0146  TempSrc: Oral  PainSc: 8          Complications: No apparent anesthesia complications

## 2016-10-07 NOTE — Plan of Care (Signed)
Problem: Activity: Goal: Ability to tolerate increased activity will improve Outcome: Progressing Pt ambulated to the bathroom with minimal assistance.  RN assisted with IV pole and foley catheter bag but pt ambulated without support.

## 2016-10-07 NOTE — Anesthesia Procedure Notes (Signed)
Spinal  Patient location during procedure: OR Start time: 10/07/2016 4:38 AM End time: 10/07/2016 4:47 AM Staffing Anesthesiologist: Heather RobertsSINGER, Lexus Barletta Performed: anesthesiologist  Preanesthetic Checklist Completed: patient identified, surgical consent, pre-op evaluation, timeout performed, IV checked, risks and benefits discussed and monitors and equipment checked Spinal Block Patient position: sitting Prep: DuraPrep Patient monitoring: cardiac monitor, continuous pulse ox and blood pressure Approach: midline Location: L2-3 Injection technique: single-shot Needle Needle type: Pencan  Needle gauge: 24 G Needle length: 9 cm Additional Notes Functioning IV was confirmed and monitors were applied. Sterile prep and drape, including hand hygiene and sterile gloves were used. The patient was positioned and the spine was prepped. The skin was anesthetized with lidocaine.  Free flow of clear CSF was obtained prior to injecting local anesthetic into the CSF.  The spinal needle aspirated freely following injection.  The needle was carefully withdrawn.  The patient tolerated the procedure well.

## 2016-10-07 NOTE — Op Note (Signed)
Cesarean Section Procedure Note  10/07/2016  6:31 AM  PATIENT:  Dorothy Flowers  31 y.o. female  PRE-OPERATIVE DIAGNOSIS:  Primary Cesarean section for probable macrosomia; bilateral cleft lip   POST-OPERATIVE DIAGNOSIS:  Primary Cesarean section for probable macrosomia; bilateral cleft lip   PROCEDURE:  Procedure(s): CESAREAN SECTION (N/A)  SURGEON:  Surgeon(s) and Role:    * Hermina StaggersMichael L Siler Mavis, MD - Primary    * Tilda BurrowJohn Ferguson V, MD - Assisting  ASSISTANTS: none   ANESTHESIA:   spinal  EBL:  Total I/O In: 2200 [I.V.:2200] Out: 1300 [Urine:200; Blood:1100]  BLOOD ADMINISTERED:none  DRAINS: none   LOCAL MEDICATIONS USED:  NONE  SPECIMEN:  Source of Specimen:  uterus  DISPOSITION OF SPECIMEN:  PATHOLOGY   Procedure Details   The patient presented to the MAU with c/o SROM which was confirmed. She was scheduled for primary elected c section secondary to suspected macrosomia. Pt however ate about 2 hours before arrival. Discussed with anesthesia and as pt was not labor and not a prior section. Would wait for 6 hours after meal . Pt however did begin to labor and we proceeded with c section. Of note infant is suspected to have bilateral cleft lip. NICU has been notified. The risks, benefits, complications, treatment options, and expected outcomes were discussed with the patient.  The patient concurred with the proposed plan, giving informed consent.  The site of surgery properly noted/marked. The patient was taken to Operating Room # 1, identified as Dorothy Flowers and the procedure verified as C-Section Delivery. A Time Out was held and the above information confirmed.  After induction of anesthesia, the patient was draped and prepped in the usual sterile manner. A Pfannenstiel incision was made and carried down through the subcutaneous tissue to the fascia. Fascial incision was made and extended transversely. The fascia was separated from the underlying rectus tissue  superiorly and inferiorly. The peritoneum was identified and entered. Peritoneal incision was extended longitudinally. The utero-vesical peritoneal reflection was incised transversely and the bladder flap was bluntly freed from the lower uterine segment. A low transverse uterine incision was made. Delivered from cephalic presentation was a  Female infant. Weights and Apgars as recorded.  After the umbilical cord was clamped and cut cord blood was obtained for evaluation. The placenta was removed intact and appeared normal. The uterine outline, tubes and ovaries appeared normal. The uterine incision was closed with running locked sutures of 0 Chromic, in a layer fashion. Completing a 2 layer closure. There was a small hematoma at the left connor of the incision which was controlled with interrupted sutures of the same. It was observed without evidence of expansion.  Hemostasis was observed. Lavage was carried out until clear. The peritoneum and abdominal muscles were reapproximated with 2/0 Vicryl.  The fascia was then reapproximated with running sutures of Vicryl. The subcutaneous tissue was closed with 2/0 plain gut sutureThe skin was reapproximated with Vicryl.  Instrument, sponge, and needle counts were correct prior the abdominal closure and at the conclusion of the case.   Complications:  None; patient tolerated the procedure well.  COUNTS:  YES  PLAN OF CARE: Admit to inpatient   PATIENT DISPOSITION:  PACU - hemodynamically stable.   Delay start of Pharmacological VTE agent (>24hrs) due to surgical blood loss or risk of bleeding: not applicable             Disposition: PACU - hemodynamically stable.         Condition:  stable   [name], MD 10/07/2016 6:31 AM

## 2016-10-07 NOTE — Anesthesia Postprocedure Evaluation (Signed)
Anesthesia Post Note  Patient: Dorothy Flowers  Procedure(s) Performed: Procedure(s) (LRB): CESAREAN SECTION (N/A)  Patient location during evaluation: SICU Anesthesia Type: MAC Level of consciousness: sedated Pain management: pain level controlled Vital Signs Assessment: post-procedure vital signs reviewed and stable Respiratory status: patient remains intubated per anesthesia plan Cardiovascular status: stable Anesthetic complications: no        Last Vitals:  Vitals:   10/07/16 0146  BP: 118/89  Pulse: 113  Resp: 19  Temp: 36.9 C    Last Pain:  Vitals:   10/07/16 0146  TempSrc: Oral  PainSc: 8    Pain Goal:                 Avarose Mervine DANIEL

## 2016-10-07 NOTE — H&P (Signed)
Dorothy HammedJennifer L Flowers is a 31 y.o. female G2P1001 IUP 38 5/7 weeks who presents with SROM. Pt is for planned c section d/t suspected macrosomia.  Prenatal care has been complicated by above fetus with celt lip. OB History    Gravida Para Term Preterm AB Living   2 1 1     1    SAB TAB Ectopic Multiple Live Births         0 1     Past Medical History:  Diagnosis Date  . Asthma    as child  . MRSA (methicillin resistant Staphylococcus aureus)    Past Surgical History:  Procedure Laterality Date  . WISDOM TOOTH EXTRACTION     Family History: family history includes COPD in her paternal grandmother; Cancer in her paternal grandfather; Diabetes in her maternal aunt and maternal grandmother; Heart disease in her father and paternal grandfather; Hypertension in her father and mother; Mental illness in her paternal uncle; Stroke in her father and mother. Social History:  reports that she has quit smoking. Her smoking use included Cigarettes. She smoked 0.00 packs per day. She has never used smokeless tobacco. She reports that she does not drink alcohol or use drugs.     Review of Systems  Constitutional: Negative.   HENT: Negative.   Respiratory: Negative.   Cardiovascular: Negative.   Gastrointestinal: Negative.   Genitourinary: Negative.    History   Height 5\' 5"  (1.651 m), weight 133.9 kg (295 lb 4 oz), last menstrual period 01/10/2016, unknown if currently breastfeeding. Exam Physical Exam  Constitutional: She appears well-developed and well-nourished.  HENT:  Head: Normocephalic.  Neck: Normal range of motion. Neck supple.  Cardiovascular: Normal rate and regular rhythm.   Respiratory: Effort normal and breath sounds normal.  GI: Soft. Bowel sounds are normal.  Musculoskeletal:  Trace edema  Neurological: She has normal reflexes.    Prenatal labs: ABO, Rh: O/Positive/-- (08/01 1158) Antibody: Negative (12/13 0858) Rubella: <0.90 (08/01 1158) RPR: Non Reactive (12/13  0858)  HBsAg: Negative (08/01 1158)  HIV: Non Reactive (12/13 0858)  GBS: Positive (02/12 1500)   Assessment/Plan: IUP 38 5/7 weeks SROM + GBS  Cleft lip Suspected Macrosomia  Will admit for c section. Pt ate a meal 2 hrs ago. Will plan for c section at six hour time. R/B reviewed with pt. Plan for c section at 5:30 AM.   Dorothy StaggersMichael L Ruberta Flowers 10/07/2016, 1:10 AM

## 2016-10-07 NOTE — Progress Notes (Signed)
MRSA swab resulted negative.  MD on call notified and RN given order to remove pt from contact precautions.

## 2016-10-07 NOTE — Anesthesia Postprocedure Evaluation (Signed)
Anesthesia Post Note  Patient: Lise AuerJennifer L Damiano  Procedure(s) Performed: Procedure(s) (LRB): CESAREAN SECTION (N/A)  Patient location during evaluation: Mother Baby Anesthesia Type: Spinal Level of consciousness: awake and alert and oriented Pain management: pain level controlled Vital Signs Assessment: post-procedure vital signs reviewed and stable Respiratory status: spontaneous breathing and nonlabored ventilation Cardiovascular status: stable Postop Assessment: no headache, patient able to bend at knees, no backache, no signs of nausea or vomiting, spinal receding and adequate PO intake Anesthetic complications: no        Last Vitals:  Vitals:   10/07/16 1000 10/07/16 1100  BP: (!) 109/53   Pulse: 62   Resp: 20 18  Temp: 36.7 C 36.7 C    Last Pain:  Vitals:   10/07/16 1316  TempSrc:   PainSc: 2    Pain Goal: Patients Stated Pain Goal: 3 (10/07/16 1316)               Vernee Baines Hristova

## 2016-10-08 LAB — CBC
HCT: 26.3 % — ABNORMAL LOW (ref 36.0–46.0)
HEMOGLOBIN: 9 g/dL — AB (ref 12.0–15.0)
MCH: 29.6 pg (ref 26.0–34.0)
MCHC: 34.2 g/dL (ref 30.0–36.0)
MCV: 86.5 fL (ref 78.0–100.0)
Platelets: 233 10*3/uL (ref 150–400)
RBC: 3.04 MIL/uL — ABNORMAL LOW (ref 3.87–5.11)
RDW: 14.2 % (ref 11.5–15.5)
WBC: 11.5 10*3/uL — AB (ref 4.0–10.5)

## 2016-10-08 LAB — RPR: RPR Ser Ql: NONREACTIVE

## 2016-10-08 MED ORDER — MEASLES, MUMPS & RUBELLA VAC ~~LOC~~ INJ
0.5000 mL | INJECTION | Freq: Once | SUBCUTANEOUS | Status: AC
  Administered 2016-10-10: 0.5 mL via SUBCUTANEOUS
  Filled 2016-10-08 (×2): qty 0.5

## 2016-10-08 MED ORDER — FAMOTIDINE 20 MG PO TABS
20.0000 mg | ORAL_TABLET | Freq: Every day | ORAL | Status: DC
  Administered 2016-10-08 – 2016-10-09 (×2): 20 mg via ORAL
  Filled 2016-10-08 (×2): qty 1

## 2016-10-08 NOTE — Progress Notes (Signed)
Dr. Criselda PeachesMullen called to be notified of honeycomb needing changed it was 90 percent saturated. Patient had just gotten out of the shower. Order for dressing changed in. Oozing noted on right side of incision, 1 minute of pressure done. New Honeycomb applied.

## 2016-10-08 NOTE — Progress Notes (Signed)
Patient passing gas. Patient told to take dressing off in shower. Patient told to walk in hallways. Use incentive spirometer. Told she could shower with someone in the room.

## 2016-10-08 NOTE — Progress Notes (Signed)
Subjective: Postpartum Day #1: Cesarean Delivery Patient reports tolerating PO, + flatus and no problems voiding.    Objective: Vital signs in last 24 hours: Temp:  [97.9 F (36.6 C)-98.4 F (36.9 C)] 97.9 F (36.6 C) (02/24 0510) Pulse Rate:  [57-80] 80 (02/24 0510) Resp:  [16-20] 18 (02/24 0510) BP: (109-129)/(52-69) 129/69 (02/24 0510) SpO2:  [96 %-99 %] 97 % (02/23 1821)  Physical Exam:  General: alert, cooperative and no distress Lochia: appropriate Uterine Fundus: firm Incision: pressure dsg intact, dry DVT Evaluation: No evidence of DVT seen on physical exam.   Recent Labs  10/07/16 0052 10/08/16 0545  HGB 11.7* 9.0*  HCT 34.1* 26.3*    Assessment/Plan: Status post Cesarean section. Doing well postoperatively.  Continue current care. Anticipate d/c on 2/25  Cam HaiSHAW, Jecenia Leamer CNM 10/08/2016, 8:44 AM

## 2016-10-08 NOTE — Progress Notes (Signed)
Patient complaining of a new boil that she noticed on superpubic area. No puss noted. Dr. Genevie AnnSchenk notified and recommended hot compresses and will see patient later. Mom told to do  hot compresses.

## 2016-10-08 NOTE — Progress Notes (Signed)
Evaluated patient for new boil. Patient noted boil on right suprapubic region when using the restroom this morning. Reports problems with frequent boils this pregnancy. She denies drainage. Has been placing hot compresses on and off for the past couple of hours.   Patient has remained afebrile. On exam, patient has an approximately 2.5 x 2 cm area of induration on the right suprapubic area with minimal overlying erythema. Erythema is not extending into the surrounding skin. No drainage present. Tender to palpation.   Will have patient continue to do hot compresses as area is significantly indurated and no fluctuance appreciated. Do not believe I&D would be beneficial at this point. RN to mark borders of erythema. If extends, will start Keflex. Team to re-evaluate in the morning.   Marcy Sirenatherine Veronica Fretz, D.O. 10/08/2016, 8:34 PM PGY-2, [facility]

## 2016-10-09 MED ORDER — SULFAMETHOXAZOLE-TRIMETHOPRIM 800-160 MG PO TABS
1.0000 | ORAL_TABLET | Freq: Two times a day (BID) | ORAL | Status: DC
  Administered 2016-10-09 – 2016-10-10 (×3): 1 via ORAL
  Filled 2016-10-09 (×3): qty 1

## 2016-10-09 NOTE — Progress Notes (Signed)
Darline CNM notified of patient having 50 percent drainage on her negative pressure dressing. She said to call if it gets completely saturated. No new orders . Will continue to monitor.

## 2016-10-09 NOTE — Progress Notes (Signed)
Post Operative Day 2 Subjective:  Dorothy Flowers is a 31 y.o. G2P2001 4437w5d s/p pLTCS for h/o 4th degree lacteration.  Patient with oozing from wound despite replacement of honeycomb dressing and pressure dressing applied overnight.  Pt denies problems with ambulating, voiding or po intake.  She denies nausea or vomiting.  Pain is well controlled.  She has had flatus. She has had bowel movement.  Lochia Small.  Plan for birth control is abstinence.  Method of Feeding: Bottle   Objective: Blood pressure 118/78, pulse 80, temperature 98.4 F (36.9 C), temperature source Oral, resp. rate 18, height 5\' 5"  (1.651 m), weight 133.9 kg (295 lb 4 oz), last menstrual period 01/10/2016, SpO2 99 %, unknown if currently breastfeeding.  Physical Exam:  General: alert, cooperative and no distress Chest: CTAB Heart: RRR no m/r/g Abdomen: +BS, soft, nontender Incision: healing well, area on right side with serosanguinous drainage with pressure and movement, otherwise dry  Uterine Fundus: firm, below umbilicus  DVT Evaluation: No evidence of DVT seen on physical exam. Skin: 4 cm right suprapubic skin boil not extending beyond marked borders without significant overlying erythema.   Recent Labs  10/07/16 0052 10/08/16 0545  HGB 11.7* 9.0*  HCT 34.1* 26.3*    Assessment/Plan:  ASSESSMENT: Dorothy Flowers is a 31 y.o. G2P2001 4437w5d s/p pLTCS.   Pico dressing placed for drainage. Will remain in place for 7 days.   Plan for discharge tomorrow   LOS: 2 days   De HollingsheadCatherine L Wallace 10/09/2016, 7:23 AM   OB Fellow attestation I have seen and examined this patient and agree with above documentation in the Wallace's note.   Dorothy Flowers is a 31 y.o. G2P2001 s/p PLTCS.   Pain is well controlled.  Plan for birth control is abstinence.  Method of Feeding: breast.  Overnight patient had serosanguinous drainage soaking the pressure dressing x2. Leaking from the right most aspect of the  incision. No erythema.   PE:  BP 118/78 (BP Location: Left Arm)   Pulse 80   Temp 98.4 F (36.9 C) (Oral)   Resp 18   Ht 5\' 5"  (1.651 m)   Wt 295 lb 4 oz (133.9 kg)   LMP 01/10/2016 (Exact Date)   SpO2 99%   Breastfeeding? Unknown   BMI 49.13 kg/m    Incision healing well. 1cm open area at the right aspect of the inicision Small boil on the mons Fundus firm Homan's sign negative, trace edema B/l   Recent Labs  10/07/16 0052 10/08/16 0545  HGB 11.7* 9.0*  HCT 34.1* 26.3*     Plan: discharge tomorrow -PICO placed. -monitor incision. - postpartum care discussed - f/u clinic in 6 weeks for postpartum visit   Ernestina PennaNicholas Devann Cribb, MD 7:55 AM

## 2016-10-09 NOTE — Progress Notes (Signed)
Dr. Genevie AnnSchenk in to put on PICO dressing due to honeycoomb dressing being saturated. Patient told she could shower. Plan of care explained to patient.

## 2016-10-09 NOTE — Progress Notes (Signed)
2300 Patient complained of right side incisional pain and drainage. Dr Earlene PlaterWallace paged. Meredith LeedsWallace and Schenk examined patient and changed the dressing.   Around 0000 patient complained of still feeling wet and about 75% of honeycomb saturated. Pressure dressing applied with MD approval.   0630 pressure dressing becoming saturated. MD called and en route to exam patient.

## 2016-10-10 ENCOUNTER — Encounter: Payer: Medicaid Other | Admitting: Obstetrics & Gynecology

## 2016-10-10 ENCOUNTER — Other Ambulatory Visit: Payer: Medicaid Other

## 2016-10-10 MED ORDER — IBUPROFEN 600 MG PO TABS: 600.0000 mg | ORAL_TABLET | Freq: Four times a day (QID) | ORAL | 0 refills | Status: DC

## 2016-10-10 MED ORDER — OXYCODONE-ACETAMINOPHEN 5-325 MG PO TABS: 1.0000 | ORAL_TABLET | ORAL | 0 refills | Status: DC | PRN

## 2016-10-10 NOTE — Discharge Summary (Signed)
OB Discharge Summary  Patient Name: MARKI FREDE DOB: 31-Mar-1986 MRN: [id number]  Date of admission: 10/07/2016 Delivering MD: [name]   Date of discharge: 10/10/2016  Admitting diagnosis: 40Wks Waterbroke Intrauterine pregnancy: [redacted]w[redacted]d     Secondary diagnosis:Active Problems:   Postpartum care following cesarean delivery  Additional problems:none     Discharge diagnosis: Term Pregnancy Delivered                                                                     Post partum procedures:n/a  Augmentation: n/a  Complications: None  Hospital course:  Sceduled C/S   31 y.o. yo G2P2001 at [redacted]w[redacted]d was admitted to the hospital 10/07/2016 for scheduled cesarean section with the following indication:Macrosomia.  Membrane Rupture Time/Date: 11:30 PM ,10/06/2016   Patient delivered a Viable infant.10/07/2016  Details of operation can be found in separate operative note.  Pateint had an uncomplicated postpartum course.  She is ambulating, tolerating a regular diet, passing flatus, and urinating well. Patient is discharged home in stable condition on  10/10/16         Physical exam  Vitals:   10/08/16 1817 10/09/16 0608 10/09/16 1900 10/10/16 0500  BP: (!) 143/76 118/78 (!) 144/83 106/75  Pulse: 98 80 99 91  Resp: 18 18 18 18   Temp: 98.3 F (36.8 C) 98.4 F (36.9 C) 97.8 F (36.6 C) 98.2 F (36.8 C)  TempSrc: Oral Oral Oral Oral  SpO2: 99%  99%   Weight:      Height:       General: alert, cooperative and no distress Lochia: appropriate Uterine Fundus: firm Incision: No significant erythema, N/A DVT Evaluation: No evidence of DVT seen on physical exam. Labs: Lab Results  Component Value Date   WBC 11.5 (H) 10/08/2016   HGB 9.0 (L) 10/08/2016   HCT 26.3 (L) 10/08/2016   MCV 86.5 10/08/2016   PLT 233 10/08/2016   No flowsheet data found.  Discharge instruction: per After Visit Summary and "Baby and Me Booklet".  After Visit Meds:  Allergies as of  10/10/2016      Reactions   Cephalosporins Shortness Of Breath, Swelling, Other (See Comments)   Pain   Keflex [cephalexin] Shortness Of Breath, Rash      Medication List    TAKE these medications   ibuprofen 600 MG tablet Commonly known as:  ADVIL,MOTRIN Take 1 tablet (600 mg total) by mouth every 6 (six) hours.   oxyCODONE-acetaminophen 5-325 MG tablet Commonly known as:  PERCOCET/ROXICET Take 1 tablet by mouth every 4 (four) hours as needed (pain scale 4-7).   prenatal vitamin w/FE, FA 27-1 MG Tabs tablet Take 1 tablet by mouth daily at 12 noon.   silver sulfADIAZINE 1 % cream Commonly known as:  SILVADENE apply BID to areas What changed:  how much to take  how to take this  when to take this  additional instructions       Diet: routine diet  Activity: Advance as tolerated. Pelvic rest for 6 weeks.   Outpatient follow up:1 wk Follow up Appt:No future appointments. Follow up visit: No Follow-up on file.  Postpartum contraception: Undecided  Newborn Data: Live born female  Birth Weight: 8 lb 10.6  oz (3930 g) APGAR: 9, 9  Baby Feeding: Bottle Disposition:home with mother   10/10/2016 Wyvonnia DuskyMarie Lawson, CNM

## 2016-10-10 NOTE — Plan of Care (Signed)
Problem: Skin Integrity: Goal: Demonstration of wound healing without infection will improve Outcome: Adequate for Discharge Continued drainage. Home with PICO- new pico dressing placed before discharge 2/29/19

## 2016-10-10 NOTE — Progress Notes (Signed)
Discharge education complete, discharge instructions and follow up appointment discussed. Patient verbalized understanding. 

## 2016-10-11 ENCOUNTER — Telehealth: Payer: Self-pay | Admitting: *Deleted

## 2016-10-11 NOTE — Telephone Encounter (Signed)
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 Will print note and fax to: [phone], Incident Report Number: [id number], Attn: Person Vue

## 2016-10-14 ENCOUNTER — Encounter: Payer: Medicaid Other | Admitting: Obstetrics & Gynecology

## 2016-10-17 ENCOUNTER — Ambulatory Visit (INDEPENDENT_AMBULATORY_CARE_PROVIDER_SITE_OTHER): Payer: Medicaid Other | Admitting: Obstetrics & Gynecology

## 2016-10-17 ENCOUNTER — Encounter: Payer: Self-pay | Admitting: Obstetrics & Gynecology

## 2016-10-17 VITALS — BP 110/70 | HR 74 | Ht 63.5 in | Wt 277.5 lb

## 2016-10-17 DIAGNOSIS — Z9889 Other specified postprocedural states: Secondary | ICD-10-CM | POA: Diagnosis not present

## 2016-10-17 DIAGNOSIS — Z98891 History of uterine scar from previous surgery: Secondary | ICD-10-CM

## 2016-10-17 NOTE — Progress Notes (Signed)
  HPI: Patient returns for routine postoperative follow-up having undergone primary Caesraean section on 10/07/2016.  The patient's immediate postoperative recovery has been unremarkable. Since hospital discharge the patient reports no problems.   Current Outpatient Prescriptions: ibuprofen (ADVIL,MOTRIN) 600 MG tablet, Take 1 tablet (600 mg total) by mouth every 6 (six) hours. (Patient taking differently: Take 600 mg by mouth as needed. ), Disp: 30 tablet, Rfl: 0 oxyCODONE-acetaminophen (PERCOCET/ROXICET) 5-325 MG tablet, Take 1 tablet by mouth every 4 (four) hours as needed (pain scale 4-7)., Disp: 30 tablet, Rfl: 0 prenatal vitamin w/FE, FA (PRENATAL 1 + 1) 27-1 MG TABS tablet, Take 1 tablet by mouth daily at 12 noon., Disp: 30 each, Rfl: 12 silver sulfADIAZINE (SILVADENE) 1 % cream, apply BID to areas (Patient taking differently: Apply 1 application topically 2 (two) times daily. apply BID to areas), Disp: 50 g, Rfl: 11  No current facility-administered medications for this visit.     Blood pressure 110/70, pulse 74, height 5' 3.5" (1.613 m), weight 277 lb 8 oz (125.9 kg), not currently breastfeeding.  Physical Exam: Incision looks good healing well gentian violet place care instructions reviewed  Diagnostic Tests:   Pathology: benign  Impression: S/p primary Caesarean section  Plan: 4 weeks pp visit  Follow up: 4  weeks  Lazaro ArmsEURE,Jezel Basto H, MD

## 2016-11-21 ENCOUNTER — Ambulatory Visit: Payer: Medicaid Other | Admitting: Women's Health

## 2016-11-24 ENCOUNTER — Encounter: Payer: Self-pay | Admitting: Women's Health

## 2016-11-24 ENCOUNTER — Ambulatory Visit (INDEPENDENT_AMBULATORY_CARE_PROVIDER_SITE_OTHER): Payer: Medicaid Other | Admitting: Women's Health

## 2016-11-24 DIAGNOSIS — Z98891 History of uterine scar from previous surgery: Secondary | ICD-10-CM

## 2016-11-24 DIAGNOSIS — Z8279 Family history of other congenital malformations, deformations and chromosomal abnormalities: Secondary | ICD-10-CM

## 2016-11-24 NOTE — Patient Instructions (Addendum)
Silvadene cream to incision three times daily  Keep clean and dry

## 2016-11-24 NOTE — Progress Notes (Signed)
Subjective:    Dorothy Flowers is a 31 y.o. G72P2002 Caucasian female who presents for a postpartum visit. 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 Anesthesia: spinal. I have fully reviewed the prenatal and intrapartum course. Postpartum course has been uncomplicated. Baby's course has been complicated by bilateral cleft lip/absent palate- goes back to see surgeon soon- plans 1st surgery when >10lbs (is 11bs now) or of age. Baby is feeding by bottle- eating well. Bleeding no bleeding. Bowel function is normal. Bladder function is normal. Patient is not sexually active. Last sexual activity: prior to birth of baby. Contraception method is abstinence. Postpartum depression screening: negative. Score 0.  Last pap 07/2014 and was neg. Small area of c/s incision opened up on Monday, drained small amt at first, none now. Denies s/s infection. Slightly sore around that area.   The following portions of the patient's history were reviewed and updated as appropriate: allergies, current medications, past medical history, past surgical history and problem list.  Review of Systems Pertinent items are noted in HPI.   Vitals:   11/24/16 1344  BP: 120/80  Pulse: 78  Weight: 278 lb (126.1 kg)   Patient's last menstrual period was 11/19/2016.  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

## 2016-12-02 ENCOUNTER — Ambulatory Visit: Payer: Medicaid Other | Admitting: Women's Health

## 2018-03-03 ENCOUNTER — Other Ambulatory Visit: Payer: Self-pay

## 2018-03-03 ENCOUNTER — Encounter (HOSPITAL_COMMUNITY): Payer: Self-pay | Admitting: Emergency Medicine

## 2018-03-03 ENCOUNTER — Emergency Department (HOSPITAL_COMMUNITY)
Admission: EM | Admit: 2018-03-03 | Discharge: 2018-03-03 | Disposition: A | Discharge status: Home / Self Care | Payer: Self-pay | Attending: Emergency Medicine | Admitting: Emergency Medicine

## 2018-03-03 DIAGNOSIS — H9202 Otalgia, left ear: Secondary | ICD-10-CM | POA: Insufficient documentation

## 2018-03-03 DIAGNOSIS — Z79899 Other long term (current) drug therapy: Secondary | ICD-10-CM | POA: Insufficient documentation

## 2018-03-03 DIAGNOSIS — Z87891 Personal history of nicotine dependence: Secondary | ICD-10-CM | POA: Insufficient documentation

## 2018-03-03 DIAGNOSIS — J45909 Unspecified asthma, uncomplicated: Secondary | ICD-10-CM | POA: Insufficient documentation

## 2018-03-03 MED ORDER — DOXYCYCLINE HYCLATE 100 MG PO CAPS: 100.0000 mg | ORAL_CAPSULE | Freq: Two times a day (BID) | ORAL | 0 refills | Status: DC

## 2018-03-03 NOTE — ED Provider Notes (Signed)
[facility] EMERGENCY DEPARTMENT Provider Note   CSN: [id number] Arrival date & time: 03/03/18  1191     History   Chief Complaint Chief Complaint  Patient presents with  . Otalgia    HPI Dorothy Flowers is a 32 y.o. female.  She presents today complaining of left ear pain for 2 days.  Its throbbing in nature worse with manipulation of the ear or laying down on that side.  Noise also makes the pain worse.  She is at its moderate intensity.  She is been taking some NSAIDs with some relief.  She thinks is been a little bit of yellow drainage.  Interestingly she had the same pain about a month ago lasted a week and resolved spontaneously and she did not do anything for it.  Not associate with any fevers runny nose sore throat cough or other URI or allergy symptoms.  The history is provided by the patient.  Otalgia  This is a recurrent problem. The current episode started yesterday. There is pain in the left ear. The problem occurs constantly. The problem has not changed since onset.There has been no fever. The pain is moderate. Associated symptoms include ear discharge (scant yellow). Pertinent negatives include no headaches, no rhinorrhea, no sore throat, no abdominal pain, no diarrhea, no vomiting, no neck pain, no cough and no rash.    Past Medical History:  Diagnosis Date  . Asthma    as child  . MRSA (methicillin resistant Staphylococcus aureus)     Patient Active Problem List   Diagnosis Date Noted  . Family history of cleft lip and palate 11/24/2016  . History of low transverse cesarean section 11/24/2016  . Susceptible to varicella (non-immune), currently pregnant 03/16/2016  . Obesity 10/12/2006  . ASTHMA, UNSPECIFIED 10/12/2006  . ACNE 10/12/2006    Past Surgical History:  Procedure Laterality Date  . CESAREAN SECTION N/A 10/07/2016   Procedure: CESAREAN SECTION;  Surgeon: [name], MD;  Location: [facility] BIRTHING SUITES;  Service: Obstetrics;  Laterality: N/A;   . WISDOM TOOTH EXTRACTION       OB History    Gravida  2   Para  2   Term  2   Preterm      AB      Living  2     SAB      TAB      Ectopic      Multiple  0   Live Births  2            Home Medications    Prior to Admission medications   Medication Sig Start Date End Date Taking? Authorizing Provider  ibuprofen (ADVIL,MOTRIN) 600 MG tablet Take 1 tablet (600 mg total) by mouth every 6 (six) hours. Patient taking differently: Take 600 mg by mouth as needed.  10/10/16   Montez Morita, CNM  oxyCODONE-acetaminophen (PERCOCET/ROXICET) 5-325 MG tablet Take 1 tablet by mouth every 4 (four) hours as needed (pain scale 4-7). 10/10/16   Montez Morita, CNM  prenatal vitamin w/FE, FA (PRENATAL 1 + 1) 27-1 MG TABS tablet Take 1 tablet by mouth daily at 12 noon. 03/15/16   Cheral Marker, CNM  silver sulfADIAZINE (SILVADENE) 1 % cream apply BID to areas Patient taking differently: Apply 1 application topically 2 (two) times daily. apply BID to areas 09/15/16   Lazaro Arms, MD    Family History Family History  Problem Relation Age of Onset  . Hypertension Mother   .  Stroke Mother   . Diabetes Maternal Aunt   . Diabetes Maternal Grandmother   . Heart disease Father   . Hypertension Father   . Stroke Father   . COPD Paternal Grandmother        smokes  . Cancer Paternal Grandfather   . Heart disease Paternal Grandfather   . Mental illness Paternal Uncle   . Cleft lip Son   . Cleft palate Son     Social History Social History   Tobacco Use  . Smoking status: Former Smoker    Packs/day: 0.00    Types: Cigarettes  . Smokeless tobacco: Never Used  . Tobacco comment: quit with + preg  Substance Use Topics  . Alcohol use: No  . Drug use: No     Allergies   Cephalosporins; Keflex [cephalexin]; and Adhesive [tape]   Review of Systems Review of Systems  Constitutional: Negative for fever.  HENT: Positive for ear discharge (scant yellow) and ear pain.  Negative for rhinorrhea and sore throat.   Eyes: Negative for visual disturbance.  Respiratory: Negative for cough and shortness of breath.   Cardiovascular: Negative for chest pain.  Gastrointestinal: Negative for abdominal pain, diarrhea and vomiting.  Genitourinary: Negative for dysuria.  Musculoskeletal: Negative for neck pain.  Skin: Negative for rash.  Neurological: Negative for headaches.     Physical Exam Updated Vital Signs BP 114/75 (BP Location: Right Arm)   Pulse 75   Temp 98 F (36.7 C) (Oral)   Resp 18   Ht 5\' 5"  (1.651 m)   Wt 127 kg (280 lb)   SpO2 98%   BMI 46.59 kg/m   Physical Exam  Constitutional: She appears well-developed and well-nourished.  HENT:  Head: Normocephalic and atraumatic.  Right Ear: Hearing, tympanic membrane, external ear and ear canal normal.  Left Ear: Hearing, external ear and ear canal normal. No drainage or swelling. Tympanic membrane is injected and retracted.  Nose: Nose normal.  Mouth/Throat: Oropharynx is clear and moist.  Eyes: Pupils are equal, round, and reactive to light. Conjunctivae and EOM are normal.  Neck: Neck supple.  Neurological: She is alert. GCS eye subscore is 4. GCS verbal subscore is 5. GCS motor subscore is 6.  Skin: Skin is warm and dry.  Psychiatric: She has a normal mood and affect.     ED Treatments / Results  Labs (all labs ordered are listed, but only abnormal results are displayed) Labs Reviewed - No data to display  EKG None  Radiology No results found.  Procedures Procedures (including critical care time)  Medications Ordered in ED Medications - No data to display   Initial Impression / Assessment and Plan / ED Course  I have reviewed the triage vital signs and the nursing notes.  Pertinent labs & imaging results that were available during my care of the patient were reviewed by me and considered in my medical decision making (see chart for details).     Final Clinical  Impressions(s) / ED Diagnoses   Final diagnoses:  Left ear pain    ED Discharge Orders        Ordered    doxycycline (VIBRAMYCIN) 100 MG capsule  2 times daily     03/03/18 0745       Terrilee FilesButler, Latavion Halls C, MD 03/04/18 [phone]

## 2018-03-03 NOTE — ED Triage Notes (Signed)
Pt c/o of left ear pain and pressure x 2 days with yellow drainage.

## 2018-03-03 NOTE — Discharge Instructions (Addendum)
Your evaluated in the emergency department for a left earache.  We are treating you with antibiotics and you should continue to take some anti-inflammatories like ibuprofen or Naprosyn.  Keep well-hydrated.  Return if any worsening of your symptoms.

## 2018-05-30 ENCOUNTER — Emergency Department (HOSPITAL_COMMUNITY)
Admission: EM | Admit: 2018-05-30 | Discharge: 2018-05-30 | Disposition: A | Discharge status: Home / Self Care | Payer: Self-pay | Attending: Emergency Medicine | Admitting: Emergency Medicine

## 2018-05-30 ENCOUNTER — Encounter (HOSPITAL_COMMUNITY): Payer: Self-pay | Admitting: Emergency Medicine

## 2018-05-30 ENCOUNTER — Other Ambulatory Visit: Payer: Self-pay

## 2018-05-30 ENCOUNTER — Emergency Department (HOSPITAL_COMMUNITY): Payer: Self-pay

## 2018-05-30 DIAGNOSIS — J45909 Unspecified asthma, uncomplicated: Secondary | ICD-10-CM | POA: Insufficient documentation

## 2018-05-30 DIAGNOSIS — F17210 Nicotine dependence, cigarettes, uncomplicated: Secondary | ICD-10-CM | POA: Insufficient documentation

## 2018-05-30 DIAGNOSIS — H1031 Unspecified acute conjunctivitis, right eye: Secondary | ICD-10-CM | POA: Insufficient documentation

## 2018-05-30 DIAGNOSIS — J209 Acute bronchitis, unspecified: Secondary | ICD-10-CM | POA: Insufficient documentation

## 2018-05-30 MED ORDER — TOBRAMYCIN 0.3 % OP SOLN
1.0000 [drp] | Freq: Once | OPHTHALMIC | Status: AC
  Administered 2018-05-30: 1 [drp] via OPHTHALMIC
  Filled 2018-05-30: qty 5

## 2018-05-30 MED ORDER — BENZONATATE 100 MG PO CAPS: 200.0000 mg | ORAL_CAPSULE | Freq: Three times a day (TID) | ORAL | 0 refills | Status: DC | PRN

## 2018-05-30 MED ORDER — AZITHROMYCIN 250 MG PO TABS: ORAL_TABLET | ORAL | 0 refills | Status: DC

## 2018-05-30 NOTE — Discharge Instructions (Addendum)
Apply one drop of the tobrex in each eye every 4 hours (4 doses while awake daily) for the next 7 days.  Avoid rubbing your eyes, use a warm moist wash cloth for cleaning. Wash hands frequently. Take the entire course of the antibiotic prescribed. Use tessalon for cough relief.  Rest and make sure you are drinking plenty of fluids.  Get rechecked for any worsened symptoms including weakness or shortness of breath.  Your chest xray shows you have bronchitis, but not pneumonia.  Take the entire z pack as discussed.

## 2018-05-30 NOTE — ED Provider Notes (Signed)
[facility] EMERGENCY DEPARTMENT Provider Note   CSN: [id number] Arrival date & time: 05/30/18  0807     History   Chief Complaint Chief Complaint  Patient presents with  . Sore Throat    HPI Dorothy Flowers is a 32 y.o. female presenting with a  1 week history of uri type symptoms which includes nasal congestion with clear rhinorrhea, sore throat, subjective fever and cough productive of yellow sputum along with right eye redness and drainage. She states she was ill one month ago with similar sx, hers improved, now have started again. She reports her children are home with similar symptoms currently with one child being treated for pink eye.   Symptoms do not include visual changes, neck pain or stiffness, ear pain, wheezing, shortness of breath, chest pain,  Nausea, vomiting or diarrhea.  The patient has taken no medicines prior to arrival with no significant improvement in symptoms. .  The history is provided by the patient.    Past Medical History:  Diagnosis Date  . Asthma    as child  . MRSA (methicillin resistant Staphylococcus aureus)     Patient Active Problem List   Diagnosis Date Noted  . Family history of cleft lip and palate 11/24/2016  . History of low transverse cesarean section 11/24/2016  . Susceptible to varicella (non-immune), currently pregnant 03/16/2016  . Obesity 10/12/2006  . ASTHMA, UNSPECIFIED 10/12/2006  . ACNE 10/12/2006    Past Surgical History:  Procedure Laterality Date  . CESAREAN SECTION N/A 10/07/2016   Procedure: CESAREAN SECTION;  Surgeon: [name], MD;  Location: [facility] BIRTHING SUIT[facility];  Service: Obstetrics;  Laterality: N/A;  . WISDOM TOOTH EXTRACTION       OB History    Gravida  2   Para  2   Term  2   Preterm      AB      Living  2     SAB      TAB      Ectopic      Multiple  0   Live Births  2            Home Medications    Prior to Admission medications   Medication Sig Start Date End Date  Taking? Authorizing Provider  azithromycin (ZITHROMAX Z-PAK) 250 MG tablet Take 2 tablets by mouth on day one followed by one tablet daily for 4 days. 05/30/18   Burgess Amor, PA-C  benzonatate (TESSALON) 100 MG capsule Take 2 capsules (200 mg total) by mouth 3 (three) times daily as needed. 05/30/18   Burgess Amor, PA-C  doxycycline (VIBRAMYCIN) 100 MG capsule Take 1 capsule (100 mg total) by mouth 2 (two) times daily. 03/03/18   Terrilee Files, MD  ibuprofen (ADVIL,MOTRIN) 600 MG tablet Take 1 tablet (600 mg total) by mouth every 6 (six) hours. Patient taking differently: Take 600 mg by mouth as needed.  10/10/16   Montez Morita, CNM  oxyCODONE-acetaminophen (PERCOCET/ROXICET) 5-325 MG tablet Take 1 tablet by mouth every 4 (four) hours as needed (pain scale 4-7). 10/10/16   Montez Morita, CNM  prenatal vitamin w/FE, FA (PRENATAL 1 + 1) 27-1 MG TABS tablet Take 1 tablet by mouth daily at 12 noon. 03/15/16   Cheral Marker, CNM  silver sulfADIAZINE (SILVADENE) 1 % cream apply BID to areas Patient taking differently: Apply 1 application topically 2 (two) times daily. apply BID to areas 09/15/16   Lazaro Arms, MD  Family History Family History  Problem Relation Age of Onset  . Hypertension Mother   . Stroke Mother   . Diabetes Maternal Aunt   . Diabetes Maternal Grandmother   . Heart disease Father   . Hypertension Father   . Stroke Father   . COPD Paternal Grandmother        smokes  . Cancer Paternal Grandfather   . Heart disease Paternal Grandfather   . Mental illness Paternal Uncle   . Cleft lip Son   . Cleft palate Son     Social History Social History   Tobacco Use  . Smoking status: Current Some Day Smoker    Packs/day: 0.00    Types: Cigarettes  . Smokeless tobacco: Never Used  Substance Use Topics  . Alcohol use: No  . Drug use: No     Allergies   Cephalosporins; Keflex [cephalexin]; and Adhesive [tape]   Review of Systems Review of Systems    Constitutional: Positive for chills and fever.  HENT: Positive for congestion, rhinorrhea and sore throat. Negative for ear pain, sinus pressure, trouble swallowing and voice change.   Eyes: Positive for discharge and redness.  Respiratory: Positive for cough. Negative for chest tightness, shortness of breath, wheezing and stridor.   Cardiovascular: Negative for chest pain.  Gastrointestinal: Negative for abdominal pain, nausea and vomiting.  Genitourinary: Negative.   Skin: Negative for rash.     Physical Exam Updated Vital Signs BP 135/83 (BP Location: Left Arm)   Pulse 89   Temp 98.7 F (37.1 C) (Oral)   Resp 16   Ht 5\' 3"  (1.6 m)   Wt 122.5 kg   LMP 05/06/2018 (Approximate)   SpO2 98%   BMI 47.83 kg/m   Physical Exam  Constitutional: She is oriented to person, place, and time. She appears well-developed and well-nourished.  HENT:  Head: Normocephalic and atraumatic.  Right Ear: Tympanic membrane and ear canal normal.  Left Ear: Tympanic membrane and ear canal normal.  Nose: Mucosal edema and rhinorrhea present.  Mouth/Throat: Uvula is midline and mucous membranes are normal. Posterior oropharyngeal edema present. No oropharyngeal exudate, posterior oropharyngeal erythema or tonsillar abscesses.  Mild posterior erythema, no exudate, no tonsillar hypertrophy  Eyes: Conjunctivae are normal.  Cardiovascular: Normal rate and normal heart sounds.  Pulmonary/Chest: Effort normal. No respiratory distress. She has rhonchi in the right lower field. She has no rales.  Abdominal: Soft. There is no tenderness.  Musculoskeletal: Normal range of motion.  Lymphadenopathy:  No head or neck adenopathy  Neurological: She is alert and oriented to person, place, and time.  Skin: Skin is warm and dry. No rash noted.  Psychiatric: She has a normal mood and affect.     ED Treatments / Results  Labs (all labs ordered are listed, but only abnormal results are displayed) Labs Reviewed -  No data to display  EKG None  Radiology Dg Chest 2 View  Result Date: 05/30/2018 CLINICAL DATA:  Cough, congestion for 1 week EXAM: CHEST - 2 VIEW COMPARISON:  10/06/2003 FINDINGS: Right upper lobe subsegmental atelectasis. Mild peribronchial thickening. Heart is normal size. No effusions or acute bony abnormality. IMPRESSION: Bronchitic changes with anterior inferior right upper lobe atelectasis. Electronically Signed   By: Charlett Nose M.D.   On: 05/30/2018 09:14    Procedures Procedures (including critical care time)  Medications Ordered in ED Medications  tobramycin (TOBREX) 0.3 % ophthalmic solution 1 drop (1 drop Both Eyes Given 05/30/18 0859)  Initial Impression / Assessment and Plan / ED Course  I have reviewed the triage vital signs and the nursing notes.  Pertinent labs & imaging results that were available during my care of the patient were reviewed by me and considered in my medical decision making (see chart for details).     Pt with conjunctivitis and findings suggesting acute bronchitis.  CXR reviewed and discussed with pt.  Will cover with course of zithromax. Tessalon for cough. Home tx for sx relief discussed. Prn f/u with pcp or return here for new or worsened sx.   Final Clinical Impressions(s) / ED Diagnoses   Final diagnoses:  Acute bronchitis, unspecified organism  Acute conjunctivitis of right eye, unspecified acute conjunctivitis type    ED Discharge Orders         Ordered    azithromycin (ZITHROMAX Z-PAK) 250 MG tablet     05/30/18 0929    benzonatate (TESSALON) 100 MG capsule  3 times daily PRN     05/30/18 0929           Burgess Amor, PA-C 05/30/18 0930    Samuel Jester, DO 05/31/18 [phone]

## 2018-05-30 NOTE — ED Triage Notes (Signed)
Pt c/o sore throat, cough, nasal congestion, subjective fever, and redness/drainage from RT eye x 1 week. Pt reports children at home with same symptoms.

## 2018-09-10 ENCOUNTER — Other Ambulatory Visit: Payer: Self-pay

## 2018-09-10 ENCOUNTER — Emergency Department (HOSPITAL_COMMUNITY)
Admission: EM | Admit: 2018-09-10 | Discharge: 2018-09-10 | Disposition: A | Discharge status: Home / Self Care | Payer: Medicaid Other | Attending: Emergency Medicine | Admitting: Emergency Medicine

## 2018-09-10 ENCOUNTER — Encounter (HOSPITAL_COMMUNITY): Payer: Self-pay | Admitting: Emergency Medicine

## 2018-09-10 DIAGNOSIS — H6693 Otitis media, unspecified, bilateral: Secondary | ICD-10-CM | POA: Insufficient documentation

## 2018-09-10 DIAGNOSIS — Z79899 Other long term (current) drug therapy: Secondary | ICD-10-CM | POA: Insufficient documentation

## 2018-09-10 DIAGNOSIS — H6690 Otitis media, unspecified, unspecified ear: Secondary | ICD-10-CM

## 2018-09-10 DIAGNOSIS — Z87891 Personal history of nicotine dependence: Secondary | ICD-10-CM | POA: Insufficient documentation

## 2018-09-10 DIAGNOSIS — J45909 Unspecified asthma, uncomplicated: Secondary | ICD-10-CM | POA: Insufficient documentation

## 2018-09-10 DIAGNOSIS — J029 Acute pharyngitis, unspecified: Secondary | ICD-10-CM

## 2018-09-10 MED ORDER — AZITHROMYCIN 250 MG PO TABS: ORAL_TABLET | ORAL | 0 refills | Status: DC

## 2018-09-10 MED ORDER — MAGIC MOUTHWASH W/LIDOCAINE: 5.0000 mL | Freq: Three times a day (TID) | ORAL | 0 refills | Status: DC | PRN

## 2018-09-10 MED ORDER — IBUPROFEN 800 MG PO TABS: 800.0000 mg | ORAL_TABLET | Freq: Three times a day (TID) | ORAL | 0 refills | Status: DC

## 2018-09-10 NOTE — ED Provider Notes (Signed)
[facility] Provider Note   CSN: [id number] Arrival date & time: 09/10/18  1314     History   Chief Complaint Chief Complaint  Patient presents with  . Influenza    HPI Dorothy Flowers is a 33 y.o. female.  HPI  Dorothy Flowers is a 33 y.o. female who presents to the Emergency Department complaining of generalized body aches, fever, chills, cough, and sore throat.  Symptoms have been present for 3 days.  She states her children have had similar symptoms recently and were diagnosed with influenza.  She has been taking Tylenol with temporary relief.  She describes her cough as mostly nonproductive.  Fever at home is subjective.  She denies chest pain, abdominal pain, vomiting or diarrhea, and dysuria symptoms.  Shortness of breath.  Past Medical History:  Diagnosis Date  . Asthma    as child  . MRSA (methicillin resistant Staphylococcus aureus)     Patient Active Problem List   Diagnosis Date Noted  . Family history of cleft lip and palate 11/24/2016  . History of low transverse cesarean section 11/24/2016  . Susceptible to varicella (non-immune), currently pregnant 03/16/2016  . Obesity 10/12/2006  . ASTHMA, UNSPECIFIED 10/12/2006  . ACNE 10/12/2006    Past Surgical History:  Procedure Laterality Date  . CESAREAN SECTION N/A 10/07/2016   Procedure: CESAREAN SECTION;  Surgeon: Hermina StaggersMichael L Ervin, MD;  [facility];  Service: Obstetrics;  Laterality: N/A;  . WISDOM TOOTH EXTRACTION       OB History    Gravida  2   Para  2   Term  2   Preterm      AB      Living  2     SAB      TAB      Ectopic      Multiple  0   Live Births  2            Home Medications    Prior to Admission medications   Medication Sig Start Date End Date Taking? Authorizing Provider  azithromycin (ZITHROMAX Z-PAK) 250 MG tablet Take 2 tablets by mouth on day one followed by one tablet daily for 4 days. 05/30/18   Burgess AmorIdol, Julie,  PA-C  benzonatate (TESSALON) 100 MG capsule Take 2 capsules (200 mg total) by mouth 3 (three) times daily as needed. 05/30/18   Burgess AmorIdol, Julie, PA-C  doxycycline (VIBRAMYCIN) 100 MG capsule Take 1 capsule (100 mg total) by mouth 2 (two) times daily. 03/03/18   Terrilee FilesButler, Michael C, MD  ibuprofen (ADVIL,MOTRIN) 600 MG tablet Take 1 tablet (600 mg total) by mouth every 6 (six) hours. Patient taking differently: Take 600 mg by mouth as needed.  10/10/16   Montez MoritaLawson, Marie D, CNM  oxyCODONE-acetaminophen (PERCOCET/ROXICET) 5-325 MG tablet Take 1 tablet by mouth every 4 (four) hours as needed (pain scale 4-7). 10/10/16   Montez MoritaLawson, Marie D, CNM  prenatal vitamin w/FE, FA (PRENATAL 1 + 1) 27-1 MG TABS tablet Take 1 tablet by mouth daily at 12 noon. 03/15/16   Cheral MarkerBooker, Kimberly R, CNM  silver sulfADIAZINE (SILVADENE) 1 % cream apply BID to areas Patient taking differently: Apply 1 application topically 2 (two) times daily. apply BID to areas 09/15/16   Lazaro ArmsEure, Luther H, MD    Family History Family History  Problem Relation Age of Onset  . Hypertension Mother   . Stroke Mother   . Diabetes Maternal Aunt   . Diabetes Maternal Grandmother   .  Heart disease Father   . Hypertension Father   . Stroke Father   . COPD Paternal Grandmother        smokes  . Cancer Paternal Grandfather   . Heart disease Paternal Grandfather   . Mental illness Paternal Uncle   . Cleft lip Son   . Cleft palate Son     Social History Social History   Tobacco Use  . Smoking status: Former Smoker    Packs/day: 0.00    Types: Cigarettes  . Smokeless tobacco: Never Used  Substance Use Topics  . Alcohol use: Yes    Comment: occas  . Drug use: No     Allergies   Cephalosporins; Keflex [cephalexin]; and Adhesive [tape]   Review of Systems Review of Systems  Constitutional: Positive for chills and fever. Negative for activity change and appetite change.  HENT: Positive for congestion, rhinorrhea and sore throat. Negative for facial  swelling and trouble swallowing.   Eyes: Negative for visual disturbance.  Respiratory: Positive for cough. Negative for shortness of breath, wheezing and stridor.   Cardiovascular: Negative for chest pain and leg swelling.  Gastrointestinal: Negative for nausea and vomiting.  Genitourinary: Negative for difficulty urinating and dysuria.  Musculoskeletal: Positive for myalgias. Negative for neck pain and neck stiffness.  Skin: Negative for rash.  Neurological: Negative for dizziness, syncope, weakness, numbness and headaches.  Hematological: Negative for adenopathy.  Psychiatric/Behavioral: Negative for confusion.     Physical Exam Updated Vital Signs BP 114/74 (BP Location: Right Arm)   Pulse 90   Temp 98 F (36.7 C) (Oral)   Resp 16   Ht 5\' 6"  (1.676 m)   Wt 131.1 kg   LMP 08/27/2018   SpO2 100%   BMI 46.65 kg/m   Physical Exam Vitals signs and nursing note reviewed.  Constitutional:      General: She is not in acute distress.    Appearance: She is well-developed.  HENT:     Head: Normocephalic and atraumatic.     Jaw: No trismus.     Right Ear: Ear canal normal. A middle ear effusion is present.     Left Ear: Ear canal normal. Tenderness present. No drainage or swelling. A middle ear effusion is present. No mastoid tenderness. Tympanic membrane is erythematous.     Nose: Mucosal edema and rhinorrhea present.     Mouth/Throat:     Pharynx: Uvula midline. Posterior oropharyngeal erythema present. No pharyngeal swelling, oropharyngeal exudate or uvula swelling.     Tonsils: No tonsillar abscesses.  Eyes:     Conjunctiva/sclera: Conjunctivae normal.  Neck:     Musculoskeletal: Normal range of motion and neck supple.     Trachea: Phonation normal.     Meningeal: Brudzinski's sign and Kernig's sign absent.  Cardiovascular:     Rate and Rhythm: Normal rate and regular rhythm.     Heart sounds: Normal heart sounds. No murmur.  Pulmonary:     Effort: Pulmonary effort is  normal. No respiratory distress.     Breath sounds: Normal breath sounds. No wheezing or rales.  Abdominal:     General: There is no distension.     Palpations: Abdomen is soft.     Tenderness: There is no abdominal tenderness. There is no guarding or rebound.  Lymphadenopathy:     Cervical: No cervical adenopathy.  Skin:    General: Skin is warm and dry.  Neurological:     General: No focal deficit present.     Mental  Status: She is alert.     Motor: No weakness or abnormal muscle tone.     Coordination: Coordination normal.      ED Treatments / Results  Labs (all labs ordered are listed, but only abnormal results are displayed) Labs Reviewed - No data to display  EKG None  Radiology No results found.  Procedures Procedures (including critical care time)  Medications Ordered in ED Medications - No data to display   Initial Impression / Assessment and Plan / ED Course  I have reviewed the triage vital signs and the nursing notes.  Pertinent labs & imaging results that were available during my care of the patient were reviewed by me and considered in my medical decision making (see chart for details).     Patient is well-appearing.  Nontoxic.  Vitals reassuring.  Recent sick contacts.  She does have the left otitis media, so I will prescribe antibiotics and she agrees to continue Tylenol and Motrin.  She appears appropriate for discharge home.  Return precautions were discussed.  Final Clinical Impressions(s) / ED Diagnoses   Final diagnoses:  Acute otitis media, unspecified otitis media type  Pharyngitis, unspecified etiology    ED Discharge Orders    None       Pauline Ausriplett, Kimyah Frein, PA-C 09/10/18 1455    Long, Arlyss RepressJoshua G, MD 09/10/18 1554

## 2018-09-10 NOTE — ED Triage Notes (Signed)
Patient reports cough, fever, generalized body aches, and sore throat for 3 days.

## 2018-09-10 NOTE — Discharge Instructions (Addendum)
Take the antibiotic as directed until its finished.  Drink plenty of fluids.  Continue taking Tylenol every 4 hours.  With your primary doctor return to the ER for any worsening symptoms.  You may take over-the-counter Robitussin or Mucinex as directed if needed for cough.

## 2019-03-05 ENCOUNTER — Encounter (HOSPITAL_COMMUNITY): Payer: Self-pay

## 2019-03-05 ENCOUNTER — Other Ambulatory Visit: Payer: Self-pay

## 2019-03-05 ENCOUNTER — Emergency Department (HOSPITAL_COMMUNITY)
Admission: EM | Admit: 2019-03-05 | Discharge: 2019-03-05 | Disposition: A | Discharge status: Home / Self Care | Payer: Medicaid Other | Attending: Emergency Medicine | Admitting: Emergency Medicine

## 2019-03-05 DIAGNOSIS — H00014 Hordeolum externum left upper eyelid: Secondary | ICD-10-CM | POA: Insufficient documentation

## 2019-03-05 DIAGNOSIS — Z87891 Personal history of nicotine dependence: Secondary | ICD-10-CM | POA: Insufficient documentation

## 2019-03-05 DIAGNOSIS — J45909 Unspecified asthma, uncomplicated: Secondary | ICD-10-CM | POA: Insufficient documentation

## 2019-03-05 DIAGNOSIS — Z79899 Other long term (current) drug therapy: Secondary | ICD-10-CM | POA: Insufficient documentation

## 2019-03-05 NOTE — ED Triage Notes (Signed)
Pt has a stye on her left eye that she was seen for by her PCP last week. Was given eye drops, but it has not gone away. Is getting progressively bigger. NAD

## 2019-03-05 NOTE — ED Provider Notes (Signed)
[facility] Provider Note   CSN: [id number] Arrival date & time: 03/05/19  [id number]     History   Chief Complaint Chief Complaint  Patient presents with  . Stye    HPI Dorothy Flowers is a 33 y.o. female.     HPI Patient had a swollen nodule on her left upper lid.  Has had for last couple weeks.  Been given eyedrops her PCP and has been doing warm soaks.  Patient states her son had similar symptoms but it went away and hers is continued to worsen.  No fevers.  No chills.  No change in vision.  She is tried to squeeze a little bit to help become out but states that it is painful.  Has not seen an ophthalmologist for it. Past Medical History:  Diagnosis Date  . Asthma    as child  . MRSA (methicillin resistant Staphylococcus aureus)     Patient Active Problem List   Diagnosis Date Noted  . Family history of cleft lip and palate 11/24/2016  . History of low transverse cesarean section 11/24/2016  . Susceptible to varicella (non-immune), currently pregnant 03/16/2016  . Obesity 10/12/2006  . ASTHMA, UNSPECIFIED 10/12/2006  . ACNE 10/12/2006    Past Surgical History:  Procedure Laterality Date  . CESAREAN SECTION N/A 10/07/2016   Procedure: CESAREAN SECTION;  Surgeon: Hermina StaggersMichael L Ervin, MD;  [facility];  Service: Obstetrics;  Laterality: N/A;  . WISDOM TOOTH EXTRACTION       OB History    Gravida  2   Para  2   Term  2   Preterm      AB      Living  2     SAB      TAB      Ectopic      Multiple  0   Live Births  2            Home Medications    Prior to Admission medications   Medication Sig Start Date End Date Taking? Authorizing Provider  azithromycin (ZITHROMAX) 250 MG tablet Take first 2 tablets together, then 1 every day until finished. 09/10/18   Triplett, Tammy, PA-C  benzonatate (TESSALON) 100 MG capsule Take 2 capsules (200 mg total) by mouth 3 (three) times daily as needed. 05/30/18   Burgess AmorIdol, Julie,  PA-C  ibuprofen (ADVIL,MOTRIN) 800 MG tablet Take 1 tablet (800 mg total) by mouth 3 (three) times daily. 09/10/18   Triplett, Tammy, PA-C  magic mouthwash w/lidocaine SOLN Take 5 mLs by mouth 3 (three) times daily as needed for mouth pain. Swish and spit, do not swallow 09/10/18   Triplett, Tammy, PA-C  oxyCODONE-acetaminophen (PERCOCET/ROXICET) 5-325 MG tablet Take 1 tablet by mouth every 4 (four) hours as needed (pain scale 4-7). 10/10/16   Montez MoritaLawson, Marie D, CNM  prenatal vitamin w/FE, FA (PRENATAL 1 + 1) 27-1 MG TABS tablet Take 1 tablet by mouth daily at 12 noon. 03/15/16   Cheral MarkerBooker, Kimberly R, CNM  silver sulfADIAZINE (SILVADENE) 1 % cream apply BID to areas Patient taking differently: Apply 1 application topically 2 (two) times daily. apply BID to areas 09/15/16   Lazaro ArmsEure, Luther H, MD    Family History Family History  Problem Relation Age of Onset  . Hypertension Mother   . Stroke Mother   . Diabetes Maternal Aunt   . Diabetes Maternal Grandmother   . Heart disease Father   . Hypertension Father   .  Stroke Father   . COPD Paternal Grandmother        smokes  . Cancer Paternal Grandfather   . Heart disease Paternal Grandfather   . Mental illness Paternal Uncle   . Cleft lip Son   . Cleft palate Son     Social History Social History   Tobacco Use  . Smoking status: Former Smoker    Packs/day: 0.00    Types: Cigarettes  . Smokeless tobacco: Never Used  Substance Use Topics  . Alcohol use: Yes    Comment: occas  . Drug use: No     Allergies   Cephalosporins, Keflex [cephalexin], and Adhesive [tape]   Review of Systems Review of Systems  Constitutional: Negative for appetite change.  HENT: Negative for congestion.   Eyes: Positive for pain and discharge. Negative for redness and visual disturbance.  Respiratory: Negative for shortness of breath.      Physical Exam Updated Vital Signs BP 135/85 (BP Location: Left Arm)   Pulse 77   Temp 98.1 F (36.7 C) (Oral)   Resp  15   Ht 5\' 6"  (1.676 m)   Wt 127 kg   LMP 02/19/2019   SpO2 96%   BMI 45.19 kg/m   Physical Exam Vitals signs and nursing note reviewed.  Eyes:     Extraocular Movements: Extraocular movements intact.     Pupils: Pupils are equal, round, and reactive to light.     Comments: Nodule in left upper mid lid.  No purulent drainage.  No whitehead seen.  Nodule is approximately 4 mm across.  Mild erythema.  Lid everted and evaluated.  Cardiovascular:     Rate and Rhythm: Normal rate.  Neurological:     Mental Status: She is alert.      ED Treatments / Results  Labs (all labs ordered are listed, but only abnormal results are displayed) Labs Reviewed - No data to display  EKG None  Radiology No results found.  Procedures Procedures (including critical care time)  Medications Ordered in ED Medications - No data to display   Initial Impression / Assessment and Plan / ED Course  I have reviewed the triage vital signs and the nursing notes.  Pertinent labs & imaging results that were available during my care of the patient were reviewed by me and considered in my medical decision making (see chart for details).        Patient with stye.  Has been using antibiotic drops and warm soaks.  Still worsening.  Does not appear to have a bad infection at this time however.  I do not think she needs oral antibiotics.  Will have follow-up with ophthalmology.  Final Clinical Impressions(s) / ED Diagnoses   Final diagnoses:  Hordeolum externum of left upper eyelid    ED Discharge Orders    None       Davonna Belling, MD 03/05/19 [phone]

## 2019-03-09 ENCOUNTER — Emergency Department (HOSPITAL_COMMUNITY): Payer: Self-pay

## 2019-03-09 ENCOUNTER — Other Ambulatory Visit: Payer: Self-pay

## 2019-03-09 ENCOUNTER — Encounter (HOSPITAL_COMMUNITY): Payer: Self-pay | Admitting: Emergency Medicine

## 2019-03-09 ENCOUNTER — Emergency Department (HOSPITAL_COMMUNITY)
Admission: EM | Admit: 2019-03-09 | Discharge: 2019-03-09 | Disposition: A | Discharge status: Home / Self Care | Payer: Self-pay | Attending: Emergency Medicine | Admitting: Emergency Medicine

## 2019-03-09 DIAGNOSIS — Z91048 Other nonmedicinal substance allergy status: Secondary | ICD-10-CM | POA: Insufficient documentation

## 2019-03-09 DIAGNOSIS — H00014 Hordeolum externum left upper eyelid: Secondary | ICD-10-CM | POA: Insufficient documentation

## 2019-03-09 DIAGNOSIS — Z87891 Personal history of nicotine dependence: Secondary | ICD-10-CM | POA: Insufficient documentation

## 2019-03-09 DIAGNOSIS — Z881 Allergy status to other antibiotic agents status: Secondary | ICD-10-CM | POA: Insufficient documentation

## 2019-03-09 DIAGNOSIS — J45909 Unspecified asthma, uncomplicated: Secondary | ICD-10-CM | POA: Insufficient documentation

## 2019-03-09 DIAGNOSIS — K8020 Calculus of gallbladder without cholecystitis without obstruction: Secondary | ICD-10-CM | POA: Insufficient documentation

## 2019-03-09 DIAGNOSIS — R1011 Right upper quadrant pain: Secondary | ICD-10-CM

## 2019-03-09 LAB — CBC WITH DIFFERENTIAL/PLATELET
Abs Immature Granulocytes: 0.04 10*3/uL (ref 0.00–0.07)
Basophils Absolute: 0 10*3/uL (ref 0.0–0.1)
Basophils Relative: 0 %
Eosinophils Absolute: 0.1 10*3/uL (ref 0.0–0.5)
Eosinophils Relative: 1 %
HCT: 41.5 % (ref 36.0–46.0)
Hemoglobin: 13.5 g/dL (ref 12.0–15.0)
Immature Granulocytes: 0 %
Lymphocytes Relative: 15 %
Lymphs Abs: 1.7 10*3/uL (ref 0.7–4.0)
MCH: 28.6 pg (ref 26.0–34.0)
MCHC: 32.5 g/dL (ref 30.0–36.0)
MCV: 87.9 fL (ref 80.0–100.0)
Monocytes Absolute: 0.7 10*3/uL (ref 0.1–1.0)
Monocytes Relative: 6 %
Neutro Abs: 8.9 10*3/uL — ABNORMAL HIGH (ref 1.7–7.7)
Neutrophils Relative %: 78 %
Platelets: 407 10*3/uL — ABNORMAL HIGH (ref 150–400)
RBC: 4.72 MIL/uL (ref 3.87–5.11)
RDW: 12.7 % (ref 11.5–15.5)
WBC: 11.5 10*3/uL — ABNORMAL HIGH (ref 4.0–10.5)
nRBC: 0 % (ref 0.0–0.2)

## 2019-03-09 LAB — COMPREHENSIVE METABOLIC PANEL
ALT: 17 U/L (ref 0–44)
AST: 25 U/L (ref 15–41)
Albumin: 3.9 g/dL (ref 3.5–5.0)
Alkaline Phosphatase: 92 U/L (ref 38–126)
Anion gap: 11 (ref 5–15)
BUN: 8 mg/dL (ref 6–20)
CO2: 23 mmol/L (ref 22–32)
Calcium: 8.8 mg/dL — ABNORMAL LOW (ref 8.9–10.3)
Chloride: 101 mmol/L (ref 98–111)
Creatinine, Ser: 0.62 mg/dL (ref 0.44–1.00)
GFR calc Af Amer: >60 mL/min (ref >60)
GFR calc non Af Amer: >60 mL/min (ref >60)
Glucose, Bld: 104 mg/dL — ABNORMAL HIGH (ref 70–99)
Potassium: 4.1 mmol/L (ref 3.5–5.1)
Sodium: 135 mmol/L (ref 135–145)
Total Bilirubin: 0.8 mg/dL (ref 0.3–1.2)
Total Protein: 8.1 g/dL (ref 6.5–8.1)

## 2019-03-09 LAB — URINALYSIS, ROUTINE W REFLEX MICROSCOPIC
Bacteria, UA: NONE SEEN
Bilirubin Urine: NEGATIVE
Glucose, UA: NEGATIVE mg/dL
Ketones, ur: 5 mg/dL — AB
Leukocytes,Ua: NEGATIVE
Nitrite: NEGATIVE
Protein, ur: NEGATIVE mg/dL
Specific Gravity, Urine: 1.006 (ref 1.005–1.030)
pH: 8 (ref 5.0–8.0)

## 2019-03-09 LAB — TROPONIN I (HIGH SENSITIVITY): Troponin I (High Sensitivity): 2 ng/L (ref <18)

## 2019-03-09 LAB — LIPASE, BLOOD: Lipase: 16 U/L (ref 11–51)

## 2019-03-09 LAB — PREGNANCY, URINE: Preg Test, Ur: NEGATIVE

## 2019-03-09 MED ORDER — ONDANSETRON HCL 4 MG/2ML IJ SOLN
4.0000 mg | Freq: Once | INTRAMUSCULAR | Status: AC
  Administered 2019-03-09: 4 mg via INTRAVENOUS
  Filled 2019-03-09: qty 2

## 2019-03-09 MED ORDER — FENTANYL CITRATE (PF) 100 MCG/2ML IJ SOLN
50.0000 ug | Freq: Once | INTRAMUSCULAR | Status: AC
  Administered 2019-03-09: 50 ug via INTRAVENOUS
  Filled 2019-03-09: qty 2

## 2019-03-09 MED ORDER — SODIUM CHLORIDE 0.9 % IV BOLUS
1000.0000 mL | Freq: Once | INTRAVENOUS | Status: AC
  Administered 2019-03-09: 1000 mL via INTRAVENOUS

## 2019-03-09 MED ORDER — ONDANSETRON 4 MG PO TBDP: 4.0000 mg | ORAL_TABLET | Freq: Three times a day (TID) | ORAL | 0 refills | Status: DC | PRN

## 2019-03-09 MED ORDER — OXYCODONE-ACETAMINOPHEN 5-325 MG PO TABS: 1.0000 | ORAL_TABLET | ORAL | 0 refills | Status: DC | PRN

## 2019-03-09 NOTE — Discharge Instructions (Addendum)
I discussed your case with the general surgeon on-call Dr. Aviva Signs.  Call the office on Monday for an appointment.  Remind the office staff that you were in the emergency department on Saturday and received an ultrasound.  Prescription for pain and nausea medicine E prescribed to your pharmacy.  Avoid rich fatty foods.

## 2019-03-09 NOTE — ED Provider Notes (Addendum)
[facility]GENCY [facility]PARTMENT Provider Note   CSN: [id number] Arrival date & time: 03/09/19  0741    History   Chief Complaint Chief Complaint  Patient presents with  . Abdominal Pain    HPI Dorothy Flowers is a 33 y.o. female.     Nausea and vomiting with associated right upper quadrant pain since Wednesday.  Recent emergency department visit for stye on her left upper eyelid.  Review of systems positive for dyspnea.  No specific substernal chest pain.  No known history of gallbladder disease.  Severity of symptoms moderate.  Nothing makes symptoms better or worse.     Past Medical History:  Diagnosis Date  . Asthma    as child  . MRSA (methicillin resistant Staphylococcus aureus)     Patient Active Problem List   Diagnosis Date Noted  . Family history of cleft lip and palate 11/24/2016  . History of low transverse cesarean section 11/24/2016  . Susceptible to varicella (non-immune), currently pregnant 03/16/2016  . Obesity 10/12/2006  . ASTHMA, UNSPECIFIED 10/12/2006  . ACNE 10/12/2006    Past Surgical History:  Procedure Laterality Date  . CESAREAN SECTION N/A 10/07/2016   Procedure: CESAREAN SECTION;  Surgeon: Hermina StaggersMichael L Ervin, MD;  [facility];  Service: Obstetrics;  Laterality: N/A;  . WISDOM TOOTH EXTRACTION       OB History    Gravida  2   Para  2   Term  2   Preterm      AB      Living  2     SAB      TAB      Ectopic      Multiple  0   Live Births  2            Home Medications    Prior to Admission medications   Medication Sig Start Date End Date Taking? Authorizing Provider  ibuprofen (ADVIL) 200 MG tablet Take 400 mg by mouth every 6 (six) hours as needed.   Yes [provider]  ondansetron (ZOFRAN ODT) 4 MG disintegrating tablet Take 1 tablet (4 mg total) by mouth every 8 (eight) hours as needed for nausea or vomiting. 03/09/19   Donnetta Hutchingook, Daniyal Tabor, MD  oxyCODONE-acetaminophen (PERCOCET) 5-325 MG  tablet Take 1 tablet by mouth every 4 (four) hours as needed. 03/09/19   Donnetta Hutchingook, Khamani Daniely, MD    Family History Family History  Problem Relation Age of Onset  . Hypertension Mother   . Stroke Mother   . Diabetes Maternal Aunt   . Diabetes Maternal Grandmother   . Heart disease Father   . Hypertension Father   . Stroke Father   . COPD Paternal Grandmother        smokes  . Cancer Paternal Grandfather   . Heart disease Paternal Grandfather   . Mental illness Paternal Uncle   . Cleft lip Son   . Cleft palate Son     Social History Social History   Tobacco Use  . Smoking status: Former Smoker    Packs/day: 0.00    Types: Cigarettes  . Smokeless tobacco: Never Used  Substance Use Topics  . Alcohol use: Yes    Comment: occas  . Drug use: No     Allergies   Cephalosporins, Keflex [cephalexin], and Adhesive [tape]   Review of Systems Review of Systems  All other systems reviewed and are negative.    Physical Exam Updated Vital Signs BP 128/66   Pulse  60   Temp 97.9 F (36.6 C) (Oral)   Resp 19   Ht 5\' 5"  (1.651 m)   Wt 127 kg   LMP 02/19/2019   SpO2 99%   BMI 46.59 kg/m   Physical Exam Vitals signs and nursing note reviewed.  Constitutional:      Appearance: She is well-developed.     Comments: Elevated BMI  HENT:     Head: Normocephalic and atraumatic.  Eyes:     Conjunctiva/sclera: Conjunctivae normal.  Neck:     Musculoskeletal: Neck supple.  Cardiovascular:     Rate and Rhythm: Normal rate and regular rhythm.  Pulmonary:     Effort: Pulmonary effort is normal.     Breath sounds: Normal breath sounds.  Abdominal:     General: Bowel sounds are normal.     Palpations: Abdomen is soft.     Comments: Tender right upper quadrant  Musculoskeletal: Normal range of motion.  Skin:    General: Skin is warm and dry.  Neurological:     Mental Status: She is alert and oriented to person, place, and time.  Psychiatric:        Behavior: Behavior normal.       ED Treatments / Results  Labs (all labs ordered are listed, but only abnormal results are displayed) Labs Reviewed  CBC WITH DIFFERENTIAL/PLATELET - Abnormal; Notable for the following components:      Result Value   WBC 11.5 (*)    Platelets 407 (*)    Neutro Abs 8.9 (*)    All other components within normal limits  COMPREHENSIVE METABOLIC PANEL - Abnormal; Notable for the following components:   Glucose, Bld 104 (*)    Calcium 8.8 (*)    All other components within normal limits  URINALYSIS, ROUTINE W REFLEX MICROSCOPIC - Abnormal; Notable for the following components:   Hgb urine dipstick SMALL (*)    Ketones, ur 5 (*)    All other components within normal limits  URINE CULTURE  LIPASE, BLOOD  PREGNANCY, URINE  TROPONIN I (HIGH SENSITIVITY)    EKG EKG Interpretation  Date/Time:  Saturday March 09 2019 09:24:17 EDT Ventricular Rate:  56 PR Interval:    QRS Duration: 103 QT Interval:  439 QTC Calculation: 424 R Axis:   59 Text Interpretation:  Sinus rhythm Low voltage, precordial leads Confirmed by Donnetta Hutchingook, Leeland Lovelady ([id number]) on 03/09/2019 9:32:40 AM   Radiology Dg Chest 2 View  Result Date: 03/09/2019 CLINICAL DATA:  33 year old female with history of shortness of breath and upper abdominal pain since Wednesday. EXAM: CHEST - 2 VIEW COMPARISON:  Chest x-ray 05/30/2018. FINDINGS: Lung volumes are normal. No consolidative airspace disease. No pleural effusions. No pneumothorax. No pulmonary nodule or mass noted. Pulmonary vasculature and the cardiomediastinal silhouette are within normal limits. IMPRESSION: No radiographic evidence of acute cardiopulmonary disease. Electronically Signed   By: Trudie Reedaniel  Entrikin M.D.   On: 03/09/2019 10:43   [place] Abdomen Limited  Result Date: 03/09/2019 CLINICAL DATA:  Right upper quadrant pain EXAM: ULTRASOUND ABDOMEN LIMITED RIGHT UPPER QUADRANT COMPARISON:  None. FINDINGS: Gallbladder: 24 mm gallstone fixed in the gallbladder neck. No wall  thickening or pericholecystic edema. Common bile duct: Diameter: 5 mm.  Where visualized, no filling defect. Liver: Echogenic liver compared to the kidney. No focal abnormality. Portal vein is patent on color Doppler imaging with normal direction of blood flow towards the liver. IMPRESSION: Gallstone fixed in the gallbladder neck. There is focal gallbladder tenderness that could reflect gallbladder  obstruction but no inflammatory changes typical of acute cholecystitis. Hepatic steatosis. Electronically Signed   By: Monte Fantasia M.D.   On: 03/09/2019 10:25    Procedures Procedures (including critical care time)  Medications Ordered in ED Medications  sodium chloride 0.9 % bolus 1,000 mL (0 mLs Intravenous Stopped 03/09/19 1010)  ondansetron (ZOFRAN) injection 4 mg (4 mg Intravenous Given 03/09/19 0909)  fentaNYL (SUBLIMAZE) injection 50 mcg (50 mcg Intravenous Given 03/09/19 0910)  fentaNYL (SUBLIMAZE) injection 50 mcg (50 mcg Intravenous Given 03/09/19 1159)  ondansetron (ZOFRAN) injection 4 mg (4 mg Intravenous Given 03/09/19 1159)     Initial Impression / Assessment and Plan / ED Course  I have reviewed the triage vital signs and the nursing notes.  Pertinent labs & imaging results that were available during my care of the patient were reviewed by me and considered in my medical decision making (see chart for details).        Patient presents with nausea, vomiting, right upper quadrant pain.  Will attempt to get gallbladder ultrasound.  Doubt cardiac etiology.   Patient rechecked prior to discharge.  Labs and ultrasound reviewed.  Liver functions normal.  Ultrasound reveals a gallstone in the gallbladder neck.  These findings were discussed with the general surgeon on-call Dr. Aviva Signs.  He will follow-up in the office.  Discharge medication Percocet and Zofran 4 mg. Final Clinical Impressions(s) / ED Diagnoses   Final diagnoses:  Right upper quadrant abdominal pain  Hordeolum  externum of left upper eyelid  Calculus of gallbladder without cholecystitis without obstruction    ED Discharge Orders         Ordered    oxyCODONE-acetaminophen (PERCOCET) 5-325 MG tablet  Every 4 hours PRN     03/09/19 1249    ondansetron (ZOFRAN ODT) 4 MG disintegrating tablet  Every 8 hours PRN     03/09/19 1249           Nat Christen, MD 03/09/19 2751    Nat Christen, MD 03/09/19 1251

## 2019-03-09 NOTE — ED Triage Notes (Signed)
Pt reports vomiting and abd pain since Wednesday with some sob. Pt was very sob on arrival to room after walking and reports that is unusual for her, although is not her main complaint.

## 2019-03-10 ENCOUNTER — Observation Stay (HOSPITAL_COMMUNITY)
Admission: EM | Admit: 2019-03-10 | Discharge: 2019-03-12 | Disposition: A | Discharge status: Home / Self Care | Payer: Self-pay | Attending: General Surgery | Admitting: General Surgery

## 2019-03-10 ENCOUNTER — Encounter (HOSPITAL_COMMUNITY): Payer: Self-pay | Admitting: Emergency Medicine

## 2019-03-10 ENCOUNTER — Other Ambulatory Visit: Payer: Self-pay

## 2019-03-10 DIAGNOSIS — Z8249 Family history of ischemic heart disease and other diseases of the circulatory system: Secondary | ICD-10-CM | POA: Insufficient documentation

## 2019-03-10 DIAGNOSIS — J45909 Unspecified asthma, uncomplicated: Secondary | ICD-10-CM | POA: Diagnosis present

## 2019-03-10 DIAGNOSIS — Z833 Family history of diabetes mellitus: Secondary | ICD-10-CM | POA: Insufficient documentation

## 2019-03-10 DIAGNOSIS — Z79899 Other long term (current) drug therapy: Secondary | ICD-10-CM | POA: Insufficient documentation

## 2019-03-10 DIAGNOSIS — R7303 Prediabetes: Secondary | ICD-10-CM | POA: Insufficient documentation

## 2019-03-10 DIAGNOSIS — K810 Acute cholecystitis: Secondary | ICD-10-CM | POA: Diagnosis present

## 2019-03-10 DIAGNOSIS — R739 Hyperglycemia, unspecified: Secondary | ICD-10-CM

## 2019-03-10 DIAGNOSIS — K8000 Calculus of gallbladder with acute cholecystitis without obstruction: Principal | ICD-10-CM | POA: Insufficient documentation

## 2019-03-10 DIAGNOSIS — Z818 Family history of other mental and behavioral disorders: Secondary | ICD-10-CM | POA: Insufficient documentation

## 2019-03-10 DIAGNOSIS — Z881 Allergy status to other antibiotic agents status: Secondary | ICD-10-CM | POA: Insufficient documentation

## 2019-03-10 DIAGNOSIS — Z87891 Personal history of nicotine dependence: Secondary | ICD-10-CM | POA: Insufficient documentation

## 2019-03-10 DIAGNOSIS — J4520 Mild intermittent asthma, uncomplicated: Secondary | ICD-10-CM

## 2019-03-10 DIAGNOSIS — E871 Hypo-osmolality and hyponatremia: Secondary | ICD-10-CM | POA: Insufficient documentation

## 2019-03-10 DIAGNOSIS — Z823 Family history of stroke: Secondary | ICD-10-CM | POA: Insufficient documentation

## 2019-03-10 DIAGNOSIS — K8020 Calculus of gallbladder without cholecystitis without obstruction: Secondary | ICD-10-CM | POA: Diagnosis present

## 2019-03-10 DIAGNOSIS — Z6841 Body Mass Index (BMI) 40.0 and over, adult: Secondary | ICD-10-CM | POA: Insufficient documentation

## 2019-03-10 DIAGNOSIS — Z8614 Personal history of Methicillin resistant Staphylococcus aureus infection: Secondary | ICD-10-CM | POA: Insufficient documentation

## 2019-03-10 DIAGNOSIS — Z20828 Contact with and (suspected) exposure to other viral communicable diseases: Secondary | ICD-10-CM | POA: Insufficient documentation

## 2019-03-10 DIAGNOSIS — K8050 Calculus of bile duct without cholangitis or cholecystitis without obstruction: Secondary | ICD-10-CM | POA: Diagnosis present

## 2019-03-10 DIAGNOSIS — E860 Dehydration: Secondary | ICD-10-CM | POA: Insufficient documentation

## 2019-03-10 DIAGNOSIS — E876 Hypokalemia: Secondary | ICD-10-CM | POA: Insufficient documentation

## 2019-03-10 DIAGNOSIS — E669 Obesity, unspecified: Secondary | ICD-10-CM | POA: Diagnosis present

## 2019-03-10 DIAGNOSIS — Z8489 Family history of other specified conditions: Secondary | ICD-10-CM | POA: Insufficient documentation

## 2019-03-10 DIAGNOSIS — Z888 Allergy status to other drugs, medicaments and biological substances status: Secondary | ICD-10-CM | POA: Insufficient documentation

## 2019-03-10 DIAGNOSIS — Z825 Family history of asthma and other chronic lower respiratory diseases: Secondary | ICD-10-CM | POA: Insufficient documentation

## 2019-03-10 DIAGNOSIS — Z880 Allergy status to penicillin: Secondary | ICD-10-CM | POA: Insufficient documentation

## 2019-03-10 DIAGNOSIS — Z791 Long term (current) use of non-steroidal anti-inflammatories (NSAID): Secondary | ICD-10-CM | POA: Insufficient documentation

## 2019-03-10 DIAGNOSIS — K760 Fatty (change of) liver, not elsewhere classified: Secondary | ICD-10-CM | POA: Insufficient documentation

## 2019-03-10 LAB — COMPREHENSIVE METABOLIC PANEL
ALT: 21 U/L (ref 0–44)
AST: 20 U/L (ref 15–41)
Albumin: 3.9 g/dL (ref 3.5–5.0)
Alkaline Phosphatase: 90 U/L (ref 38–126)
Anion gap: 13 (ref 5–15)
BUN: 7 mg/dL (ref 6–20)
CO2: 22 mmol/L (ref 22–32)
Calcium: 9.1 mg/dL (ref 8.9–10.3)
Chloride: 98 mmol/L (ref 98–111)
Creatinine, Ser: 0.62 mg/dL (ref 0.44–1.00)
GFR calc Af Amer: >60 mL/min (ref >60)
GFR calc non Af Amer: >60 mL/min (ref >60)
Glucose, Bld: 124 mg/dL — ABNORMAL HIGH (ref 70–99)
Potassium: 3.3 mmol/L — ABNORMAL LOW (ref 3.5–5.1)
Sodium: 133 mmol/L — ABNORMAL LOW (ref 135–145)
Total Bilirubin: 0.7 mg/dL (ref 0.3–1.2)
Total Protein: 7.9 g/dL (ref 6.5–8.1)

## 2019-03-10 LAB — URINE CULTURE: Culture: <10000 — AB

## 2019-03-10 LAB — CBC WITH DIFFERENTIAL/PLATELET
Abs Immature Granulocytes: 0.05 10*3/uL (ref 0.00–0.07)
Basophils Absolute: 0 10*3/uL (ref 0.0–0.1)
Basophils Relative: 0 %
Eosinophils Absolute: 0 10*3/uL (ref 0.0–0.5)
Eosinophils Relative: 0 %
HCT: 42.1 % (ref 36.0–46.0)
Hemoglobin: 13.9 g/dL (ref 12.0–15.0)
Immature Granulocytes: 0 %
Lymphocytes Relative: 9 %
Lymphs Abs: 1.4 10*3/uL (ref 0.7–4.0)
MCH: 28.6 pg (ref 26.0–34.0)
MCHC: 33 g/dL (ref 30.0–36.0)
MCV: 86.6 fL (ref 80.0–100.0)
Monocytes Absolute: 0.8 10*3/uL (ref 0.1–1.0)
Monocytes Relative: 5 %
Neutro Abs: 13.7 10*3/uL — ABNORMAL HIGH (ref 1.7–7.7)
Neutrophils Relative %: 86 %
Platelets: 436 10*3/uL — ABNORMAL HIGH (ref 150–400)
RBC: 4.86 MIL/uL (ref 3.87–5.11)
RDW: 12.3 % (ref 11.5–15.5)
WBC: 15.9 10*3/uL — ABNORMAL HIGH (ref 4.0–10.5)
nRBC: 0 % (ref 0.0–0.2)

## 2019-03-10 LAB — MAGNESIUM: Magnesium: 1.9 mg/dL (ref 1.7–2.4)

## 2019-03-10 LAB — SURGICAL PCR SCREEN
MRSA, PCR: POSITIVE — AB
Staphylococcus aureus: POSITIVE — AB

## 2019-03-10 LAB — PHOSPHORUS: Phosphorus: 2.8 mg/dL (ref 2.5–4.6)

## 2019-03-10 LAB — LIPASE, BLOOD: Lipase: 16 U/L (ref 11–51)

## 2019-03-10 LAB — SARS CORONAVIRUS 2 BY RT PCR (HOSPITAL ORDER, PERFORMED IN ~~LOC~~ HOSPITAL LAB): SARS Coronavirus 2: NEGATIVE

## 2019-03-10 MED ORDER — ACETAMINOPHEN 325 MG PO TABS: 650.0000 mg | ORAL_TABLET | Freq: Four times a day (QID) | ORAL | Status: DC | PRN

## 2019-03-10 MED ORDER — SODIUM CHLORIDE 0.9 % IV SOLN: INTRAVENOUS | Status: DC

## 2019-03-10 MED ORDER — POTASSIUM CHLORIDE IN NACL 20-0.9 MEQ/L-% IV SOLN
INTRAVENOUS | Status: DC
  Administered 2019-03-10 – 2019-03-11 (×2): via INTRAVENOUS

## 2019-03-10 MED ORDER — ONDANSETRON HCL 4 MG/2ML IJ SOLN
4.0000 mg | Freq: Four times a day (QID) | INTRAMUSCULAR | Status: DC | PRN
  Administered 2019-03-10 – 2019-03-11 (×3): 4 mg via INTRAVENOUS
  Filled 2019-03-10 (×2): qty 2

## 2019-03-10 MED ORDER — HYDROMORPHONE HCL 1 MG/ML IJ SOLN
1.0000 mg | Freq: Once | INTRAMUSCULAR | Status: AC
  Administered 2019-03-10: 1 mg via INTRAVENOUS
  Filled 2019-03-10: qty 1

## 2019-03-10 MED ORDER — ACETAMINOPHEN 650 MG RE SUPP: 650.0000 mg | Freq: Four times a day (QID) | RECTAL | Status: DC | PRN

## 2019-03-10 MED ORDER — ONDANSETRON HCL 4 MG/2ML IJ SOLN
4.0000 mg | Freq: Once | INTRAMUSCULAR | Status: AC
  Administered 2019-03-10: 4 mg via INTRAVENOUS
  Filled 2019-03-10: qty 2

## 2019-03-10 MED ORDER — PIPERACILLIN-TAZOBACTAM 3.375 G IVPB 30 MIN
3.3750 g | Freq: Once | INTRAVENOUS | Status: AC
  Administered 2019-03-10: 3.375 g via INTRAVENOUS
  Filled 2019-03-10: qty 50

## 2019-03-10 MED ORDER — HEPARIN SODIUM (PORCINE) 5000 UNIT/ML IJ SOLN
5000.0000 [IU] | Freq: Three times a day (TID) | INTRAMUSCULAR | Status: DC
  Administered 2019-03-10 – 2019-03-11 (×3): 5000 [IU] via SUBCUTANEOUS
  Filled 2019-03-10 (×3): qty 1

## 2019-03-10 MED ORDER — SODIUM CHLORIDE 0.9 % IV BOLUS
1000.0000 mL | Freq: Once | INTRAVENOUS | Status: AC
  Administered 2019-03-10: 1000 mL via INTRAVENOUS

## 2019-03-10 MED ORDER — HYDROMORPHONE HCL 1 MG/ML IJ SOLN
1.0000 mg | INTRAMUSCULAR | Status: DC | PRN
  Administered 2019-03-10 – 2019-03-11 (×5): 1 mg via INTRAVENOUS
  Filled 2019-03-10 (×5): qty 1

## 2019-03-10 MED ORDER — SODIUM CHLORIDE 0.9 % IV SOLN
3.0000 g | Freq: Four times a day (QID) | INTRAVENOUS | Status: DC
  Administered 2019-03-10 – 2019-03-11 (×3): 3 g via INTRAVENOUS
  Filled 2019-03-10 (×6): qty 8
  Filled 2019-03-10 (×2): qty 3

## 2019-03-10 NOTE — Progress Notes (Signed)
Pharmacy Antibiotic Note  Dorothy Flowers is a 33 y.o. female admitted on 03/10/2019 with intra-abdominal infection.  Pharmacy has been consulted for unasyn dosing.  Plan: Unasyn 3gm IV q6h F/U cxs and clinical progress Monitor V/S, labs  Height: 5\' 6"  (167.6 cm) Weight: 279 lb 15.8 oz (127 kg) IBW/kg (Calculated) : 59.3  Temp (24hrs), Avg:97.9 F (36.6 C), Min:97.9 F (36.6 C), Max:97.9 F (36.6 C)  Recent Labs  Lab 03/09/19 0840 03/10/19 0938  WBC 11.5* 15.9*  CREATININE 0.62 0.62    Estimated Creatinine Clearance: 135.2 mL/min (by C-G formula based on SCr of 0.62 mg/dL).    Allergies  Allergen Reactions  . Cephalosporins Shortness Of Breath, Swelling and Other (See Comments)    Patient says she can take PCN and amoxicillin  . Keflex [Cephalexin] Shortness Of Breath and Rash  . Adhesive [Tape] Itching and Rash    Antimicrobials this admission: Unasyn 7/26 >>  Zosyn 3.375gm IV x 1 in ED  Microbiology results:  BCx:  SARS-2 CV is pending Thank you for allowing pharmacy to be a part of this patient's care.  Isac Sarna, BS [place], [place] Clinical Pharmacist Pager [phone] 03/10/2019 12:04 PM

## 2019-03-10 NOTE — ED Notes (Addendum)
PTs mother called and stated that the patient was screaming in pain and had no call light and needed help. Mother stated "I need updates and she cant call to tell me whats wrong with her" I explained that we cannot give updates since the patient is an adult and that I can transfer her to the patients room. She stated "I am her mom so your gonna do what I say and call me and tell me whats happening with her so dont be a bitch"

## 2019-03-10 NOTE — ED Provider Notes (Addendum)
[facility] Provider [facility]   CSN: [id number] Arrival date & time: 03/10/19  [id number]     History   Chief Complaint Chief Complaint  Patient presents with  . Abdominal Pain    HPI Dorothy Flowers is a 33 y.o. female.     HPI Patient presents to the emergency department with right upper abdominal pain that started 3 days ago.  The patient states she been having trouble for a while but states that it got worse over the last few days.  Patient was seen here yesterday and was discharged home with medications and surgery follow-up.  The patient states that nothing seems make her condition better but certain movements and palpation make the pain worse.  Patient states she did not take any other medications prior to arrival for her symptoms.  The patient denies chest pain, shortness of breath, headache,blurred vision, neck pain, fever, cough, weakness, numbness, dizziness, anorexia, edema, diarrhea, rash, back pain, dysuria, hematemesis, bloody stool, near syncope, or syncope. Past Medical History:  Diagnosis Date  . Asthma    as child  . MRSA (methicillin resistant Staphylococcus aureus)     Patient Active Problem List   Diagnosis Date Noted  . Family history of cleft lip and palate 11/24/2016  . History of low transverse cesarean section 11/24/2016  . Susceptible to varicella (non-immune), currently pregnant 03/16/2016  . Obesity 10/12/2006  . ASTHMA, UNSPECIFIED 10/12/2006  . ACNE 10/12/2006    Past Surgical History:  Procedure Laterality Date  . CESAREAN SECTION N/A 10/07/2016   Procedure: CESAREAN SECTION;  Surgeon: Hermina StaggersMichael L Ervin, MD;  [facility];  Service: Obstetrics;  Laterality: N/A;  . WISDOM TOOTH EXTRACTION       OB History    Gravida  2   Para  2   Term  2   Preterm      AB      Living  2     SAB      TAB      Ectopic      Multiple  0   Live Births  2            Home Medications    Prior to  Admission medications   Medication Sig Start Date End Date Taking? Authorizing Provider  ibuprofen (ADVIL) 200 MG tablet Take 400 mg by mouth every 6 (six) hours as needed.   Yes [provider]  ondansetron (ZOFRAN ODT) 4 MG disintegrating tablet Take 1 tablet (4 mg total) by mouth every 8 (eight) hours as needed for nausea or vomiting. 03/09/19  Yes Donnetta Hutchingook, Brian, MD  oxyCODONE-acetaminophen (PERCOCET) 5-325 MG tablet Take 1 tablet by mouth every 4 (four) hours as needed. 03/09/19  Yes Donnetta Hutchingook, Brian, MD    Family History Family History  Problem Relation Age of Onset  . Hypertension Mother   . Stroke Mother   . Diabetes Maternal Aunt   . Diabetes Maternal Grandmother   . Heart disease Father   . Hypertension Father   . Stroke Father   . COPD Paternal Grandmother        smokes  . Cancer Paternal Grandfather   . Heart disease Paternal Grandfather   . Mental illness Paternal Uncle   . Cleft lip Son   . Cleft palate Son     Social History Social History   Tobacco Use  . Smoking status: Former Smoker    Packs/day: 0.00    Types: Cigarettes  . Smokeless  tobacco: Never Used  Substance Use Topics  . Alcohol use: Yes    Comment: occas  . Drug use: No     Allergies   Cephalosporins, Keflex [cephalexin], and Adhesive [tape]   Review of Systems Review of Systems  All other systems negative except as documented in the HPI. All pertinent positives and negatives as reviewed in the HPI. Physical Exam Updated Vital Signs BP (!) 156/76   Pulse 68   Temp 97.9 F (36.6 C) (Oral)   Ht 5\' 6"  (1.676 m)   Wt 127 kg   LMP 02/19/2019   SpO2 100%   BMI 45.19 kg/m   Physical Exam Vitals signs and nursing note reviewed.  Constitutional:      General: She is not in acute distress.    Appearance: She is well-developed.  HENT:     Head: Normocephalic and atraumatic.  Eyes:     Pupils: Pupils are equal, round, and reactive to light.  Neck:     Musculoskeletal: Normal range  of motion and neck supple.  Cardiovascular:     Rate and Rhythm: Normal rate and regular rhythm.     Heart sounds: Normal heart sounds. No murmur. No friction rub. No gallop.   Pulmonary:     Effort: Pulmonary effort is normal. No respiratory distress.     Breath sounds: Normal breath sounds. No wheezing.  Abdominal:     General: Bowel sounds are normal. There is no distension.     Palpations: Abdomen is soft.     Tenderness: There is abdominal tenderness in the right upper quadrant. There is no guarding.     Hernia: No hernia is present.  Skin:    General: Skin is warm and dry.     Capillary Refill: Capillary refill takes less than 2 seconds.     Findings: No erythema or rash.  Neurological:     Mental Status: She is alert and oriented to person, place, and time.     Motor: No abnormal muscle tone.     Coordination: Coordination normal.  Psychiatric:        Behavior: Behavior normal.      ED Treatments / Results  Labs (all labs ordered are listed, but only abnormal results are displayed) Labs Reviewed  COMPREHENSIVE METABOLIC PANEL - Abnormal; Notable for the following components:      Result Value   Sodium 133 (*)    Potassium 3.3 (*)    Glucose, Bld 124 (*)    All other components within normal limits  CBC WITH DIFFERENTIAL/PLATELET - Abnormal; Notable for the following components:   WBC 15.9 (*)    Platelets 436 (*)    Neutro Abs 13.7 (*)    All other components within normal limits  LIPASE, BLOOD    EKG None  Radiology Dg Chest 2 View  Result Date: 03/09/2019 CLINICAL DATA:  33 year old female with history of shortness of breath and upper abdominal pain since Wednesday. EXAM: CHEST - 2 VIEW COMPARISON:  Chest x-ray 05/30/2018. FINDINGS: Lung volumes are normal. No consolidative airspace disease. No pleural effusions. No pneumothorax. No pulmonary nodule or mass noted. Pulmonary vasculature and the cardiomediastinal silhouette are within normal limits.  IMPRESSION: No radiographic evidence of acute cardiopulmonary disease. Electronically Signed   By: Trudie Reedaniel  Entrikin M.D.   On: 03/09/2019 10:43   [place] Abdomen Limited  Result Date: 03/09/2019 CLINICAL DATA:  Right upper quadrant pain EXAM: ULTRASOUND ABDOMEN LIMITED RIGHT UPPER QUADRANT COMPARISON:  None. FINDINGS: Gallbladder: 24 mm  gallstone fixed in the gallbladder neck. No wall thickening or pericholecystic edema. Common bile duct: Diameter: 5 mm.  Where visualized, no filling defect. Liver: Echogenic liver compared to the kidney. No focal abnormality. Portal vein is patent on color Doppler imaging with normal direction of blood flow towards the liver. IMPRESSION: Gallstone fixed in the gallbladder neck. There is focal gallbladder tenderness that could reflect gallbladder obstruction but no inflammatory changes typical of acute cholecystitis. Hepatic steatosis. Electronically Signed   By: Monte Fantasia M.D.   On: 03/09/2019 10:25    Procedures Procedures (including critical care time)  Medications Ordered in ED Medications  sodium chloride 0.9 % bolus 1,000 mL (1,000 mLs Intravenous New Bag/Given 03/10/19 1006)  HYDROmorphone (DILAUDID) injection 1 mg (1 mg Intravenous Given 03/10/19 1002)  ondansetron (ZOFRAN) injection 4 mg (4 mg Intravenous Given 03/10/19 1002)     Initial Impression / Assessment and Plan / ED Course  I have reviewed the triage vital signs and the nursing notes.  Pertinent labs & imaging results that were available during my care of the patient were reviewed by me and considered in my medical decision making (see chart for details).        Spoke with general surgery who would like the patient mended by the hospitalist.  The patient was given the plan and all questions were answered.  Patient is placed on antibiotics at the surgeon's request.   Final Clinical Impressions(s) / ED Diagnoses   Final diagnoses:  None    ED Discharge Orders    None       Rebeca Allegra 03/10/19 1535    Nat Christen, MD 03/11/19 1549    Dalia Heading, PA-C 03/18/19 2355    Nat Christen, MD 03/22/19 [id number]

## 2019-03-10 NOTE — H&P (Signed)
History and Physical    Dorothy Flowers GNF:[id number] DOB: Oct 25, 1985 DOA: 03/10/2019  Referring MD/NP/PA: Dr. Adriana Simasook. PCP: Patient, No Pcp Per  Patient coming from: Home  Chief Complaint: Abdominal pain, nausea, vomiting  HPI: Dorothy HammedJennifer L Flowers is a 33 y.o. female with past medical history significant for morbid obesity and history of asthma; presented to emergency department secondary to abdominal pain, nausea and vomiting.  Patient reports symptom has been present since 720-second 20 and worsening.  She was seen in the emergency department on 03/09/2019 for evaluation demonstrated cholelithiasis without acute cholecystitis.  Her symptoms were controlled with IV pain medications, antiemetics and discharged home with instruction to follow-up with general surgery as an outpatient.  Patient expressed that her pain continue progressing or becomes intolerant, 10 out of 10 in intensity, localized in her right upper quadrant and radiates to her back, associated nausea/vomiting without hematemesis, unable to keep anything down.  Patient also expressed experiencing chills at home but did not take her temperature.  She denies sick contacts, productive cough, shortness of breath, hematuria, dysuria, melena, hematochezia, headaches, blurred vision, focal weakness or any other complaints.  In the ED patient work-up demonstrated mild hyponatremia and hypokalemia, hyperglycemia, elevated WBCs in the 15, 000 range and severe right upper quadrant pain on examination.  General surgery was consulted and recommended admission to the hospital with intention for cholecystectomy in the morning.  IV fluids resuscitation initiated and the patient started on IV antibiotics.  TRH has been consulted to place patient in the hospital for further evaluation and management.   Past Medical/Surgical History: Past Medical History:  Diagnosis Date  . Asthma    as child  . MRSA (methicillin resistant Staphylococcus aureus)      Past Surgical History:  Procedure Laterality Date  . CESAREAN SECTION N/A 10/07/2016   Procedure: CESAREAN SECTION;  Surgeon: Hermina StaggersMichael L Ervin, MD;  [facility];  Service: Obstetrics;  Laterality: N/A;  . WISDOM TOOTH EXTRACTION      Social History:  reports that she has quit smoking. Her smoking use included cigarettes. She smoked 0.00 packs per day. She has never used smokeless tobacco. She reports current alcohol use. She reports that she does not use drugs.  Allergies: Allergies  Allergen Reactions  . Cephalosporins Shortness Of Breath, Swelling and Other (See Comments)    Patient says she can take PCN and amoxicillin  . Keflex [Cephalexin] Shortness Of Breath and Rash  . Adhesive [Tape] Itching and Rash    Family History:  Family History  Problem Relation Age of Onset  . Hypertension Mother   . Stroke Mother   . Diabetes Maternal Aunt   . Diabetes Maternal Grandmother   . Heart disease Father   . Hypertension Father   . Stroke Father   . COPD Paternal Grandmother        smokes  . Cancer Paternal Grandfather   . Heart disease Paternal Grandfather   . Mental illness Paternal Uncle   . Cleft lip Son   . Cleft palate Son     Prior to Admission medications   Medication Sig Start Date End Date Taking? Authorizing Provider  ibuprofen (ADVIL) 200 MG tablet Take 400 mg by mouth every 6 (six) hours as needed.   Yes [provider]  ondansetron (ZOFRAN ODT) 4 MG disintegrating tablet Take 1 tablet (4 mg total) by mouth every 8 (eight) hours as needed for nausea or vomiting. 03/09/19  Yes Donnetta Hutchingook, Brian, MD  oxyCODONE-acetaminophen [facility](PERCOCET)  5-325 MG tablet Take 1 tablet by mouth every 4 (four) hours as needed. 03/09/19  Yes Donnetta Hutchingook, Brian, MD    Review of Systems:  Negative except as otherwise mentioned in HPI.   Physical Exam: Vitals:   03/10/19 1120 03/10/19 1126 03/10/19 1229 03/10/19 1443  BP: (!) 142/64  138/81 (!) 151/79  Pulse: 79  60 68   Resp:   16 18  Temp:    99.1 F (37.3 C)  TempSrc:    Oral  SpO2:  98% 99% 100%  Weight:      Height:       Constitutional: NAD, calm, comfortable; patient was seen after receiving antiemetics and pain medications in the ED.  No fever currently.  Denies chest pain and shortness of breath. Eyes: PERRL, lids and conjunctivae normal; no icterus, no nystagmus. ENMT: Mucous membranes are moist. Posterior pharynx clear of any exudate or lesions.Normal dentition.  Neck: normal, supple, no masses, no thyromegaly. Respiratory: clear to auscultation bilaterally, no wheezing, no crackles. Normal respiratory effort. No accessory muscle use.  Cardiovascular: Regular rate and rhythm, no murmurs / rubs / gallops. No extremity edema. 2+ pedal pulses. No carotid bruits.  Abdomen: Obese, soft, tenderness to palpation in her right upper quadrant; no guarding, positive bowel sounds.  Musculoskeletal: no clubbing / cyanosis. No joint deformity upper and lower extremities. Good ROM, no contractures. Normal muscle tone.  Skin: no rashes, lesions, ulcers. No induration Neurologic: CN 2-12 grossly intact. Sensation intact, DTR normal. Strength 5/5 in all 4.  Psychiatric: Normal judgment and insight. Alert and oriented x 3. Normal mood.    Labs on Admission: I have personally reviewed the following labs and imaging studies  CBC: Recent Labs  Lab 03/09/19 0840 03/10/19 0938  WBC 11.5* 15.9*  NEUTROABS 8.9* 13.7*  HGB 13.5 13.9  HCT 41.5 42.1  MCV 87.9 86.6  PLT 407* 436*   Basic Metabolic Panel: Recent Labs  Lab 03/09/19 0840 03/10/19 0938  NA 135 133*  K 4.1 3.3*  CL 101 98  CO2 23 22  GLUCOSE 104* 124*  BUN 8 7  CREATININE 0.62 0.62  CALCIUM 8.8* 9.1   GFR: Estimated Creatinine Clearance: 135.2 mL/min (by C-G formula based on SCr of 0.62 mg/dL).   Liver Function Tests: Recent Labs  Lab 03/09/19 0840 03/10/19 0938  AST 25 20  ALT 17 21  ALKPHOS 92 90  BILITOT 0.8 0.7  PROT 8.1 7.9   ALBUMIN 3.9 3.9   Recent Labs  Lab 03/09/19 0840 03/10/19 0938  LIPASE 16 16   Urine analysis:    Component Value Date/Time   COLORURINE YELLOW 03/09/2019 0854   APPEARANCEUR CLEAR 03/09/2019 0854   APPEARANCEUR Clear 03/15/2016 1158   LABSPEC 1.006 03/09/2019 0854   PHURINE 8.0 03/09/2019 0854   GLUCOSEU NEGATIVE 03/09/2019 0854   HGBUR SMALL (A) 03/09/2019 0854   BILIRUBINUR NEGATIVE 03/09/2019 0854   BILIRUBINUR Negative 03/15/2016 1158   KETONESUR 5 (A) 03/09/2019 0854   PROTEINUR NEGATIVE 03/09/2019 0854   UROBILINOGEN 0.2 01/01/2015 0938   NITRITE NEGATIVE 03/09/2019 0854   LEUKOCYTESUR NEGATIVE 03/09/2019 0854    Recent Results (from the past 240 hour(s))  Urine culture     Status: Abnormal   Collection Time: 03/09/19  8:40 AM   Specimen: Urine, Clean Catch  Result Value Ref Range Status   Specimen Description   Final    URINE, CLEAN CATCH Performed at [facility], [address] [place], [place] [address]  Special Requests   Final    NONE Performed at [facility], [address] [place], [place] [address]    Culture (A)  Final    <10,000 COLONIES/mL INSIGNIFICANT GROWTH Performed at [facility], [address] [place], [place] [address]    Report Status 03/10/2019 FINAL  Final  SARS Coronavirus 2 (CEPHEID - Performed in [facility] hospital lab), Hosp Order     Status: None   Collection Time: 03/10/19  9:20 AM   Specimen: Nasopharyngeal Swab  Result Value Ref Range Status   SARS Coronavirus 2 NEGATIVE NEGATIVE Final    Comment: (NOTE) If result is NEGATIVE SARS-CoV-2 target nucleic acids are NOT DETECTED. The SARS-CoV-2 RNA is generally detectable in upper and lower  respiratory specimens during the acute phase of infection. The lowest  concentration of SARS-CoV-2 viral copies this assay can detect is 250  copies / mL. A negative result does not preclude SARS-CoV-2 infection  and should not be used as the sole basis for treatment or other   patient management decisions.  A negative result may occur with  improper specimen collection / handling, submission of specimen other  than nasopharyngeal swab, presence of viral mutation(s) within the  areas targeted by this assay, and inadequate number of viral copies  (<250 copies / mL). A negative result must be combined with clinical  observations, patient history, and epidemiological information. If result is POSITIVE SARS-CoV-2 target nucleic acids are DETECTED. The SARS-CoV-2 RNA is generally detectable in upper and lower  respiratory specimens dur ing the acute phase of infection.  Positive  results are indicative of active infection with SARS-CoV-2.  Clinical  correlation with patient history and other diagnostic information is  necessary to determine patient infection status.  Positive results do  not rule out bacterial infection or co-infection with other viruses. If result is PRESUMPTIVE POSTIVE SARS-CoV-2 nucleic acids MAY BE PRESENT.   A presumptive positive result was obtained on the submitted specimen  and confirmed on repeat testing.  While 2019 novel coronavirus  (SARS-CoV-2) nucleic acids may be present in the submitted sample  additional confirmatory testing may be necessary for epidemiological  and / or clinical management purposes  to differentiate between  SARS-CoV-2 and other Sarbecovirus currently known to infect humans.  If clinically indicated additional testing with an alternate test  methodology [id number]) is advised. The SARS-CoV-2 RNA is generally  detectable in upper and lower respiratory sp ecimens during the acute  phase of infection. The expected result is Negative. Fact Sheet for Patients:  BoilerBrush.com.cyhttps://www.fda.gov/media/136312/download Fact Sheet for Healthcare Providers: https://pope.com/https://www.fda.gov/media/136313/download This test is not yet approved or cleared by the [place] FDA and has been authorized for detection and/or diagnosis of SARS-CoV-2 by  FDA under an Emergency Use Authorization (EUA).  This EUA will remain in effect (meaning this test can be used) for the duration of the COVID-19 declaration under Section 564(b)(1) of the Act, 21 U.S.C. section 360bbb-3(b)(1), unless the authorization is terminated or revoked sooner. Performed at [facility], [address] [place], [place] [address]      Radiological Exams on Admission: Dg Chest 2 View  Result Date: 03/09/2019 CLINICAL DATA:  33 year old female with history of shortness of breath and upper abdominal pain since Wednesday. EXAM: CHEST - 2 VIEW COMPARISON:  Chest x-ray 05/30/2018. FINDINGS: Lung volumes are normal. No consolidative airspace disease. No pleural effusions. No pneumothorax. No pulmonary nodule or mass noted. Pulmonary vasculature and the cardiomediastinal silhouette are within normal limits.  IMPRESSION: No radiographic evidence of acute cardiopulmonary disease. Electronically Signed   By: Vinnie Langton M.D.   On: 03/09/2019 10:43   US Abdomen Limited  Result Date: 03/09/2019 CLINICAL DATA:  Right upper quadrant pain EXAM: ULTRASOUND ABDOMEN LIMITED RIGHT UPPER QUADRANT COMPARISON:  None. FINDINGS: Gallbladder: 24 mm gallstone fixed in the gallbladder neck. No wall thickening or pericholecystic edema. Common bile duct: Diameter: 5 mm.  Where visualized, no filling defect. Liver: Echogenic liver compared to the kidney. No focal abnormality. Portal vein is patent on color Doppler imaging with normal direction of blood flow towards the liver. IMPRESSION: Gallstone fixed in the gallbladder neck. There is focal gallbladder tenderness that could reflect gallbladder obstruction but no inflammatory changes typical of acute cholecystitis. Hepatic steatosis. Electronically Signed   By: Monte Fantasia M.D.   On: 03/09/2019 10:25    EKG: None  Assessment/Plan 1-abdominal pain, nausea/vomiting: In the setting of cholelithiasis with presumed early cholecystitis. -Patient  expressed having chills at home, elevated WBCs present on admission; recent right upper quadrant ultrasound demonstrating cholelithiasis. -Provide fluid resuscitation, antiemetics, IV antibiotics and supportive care. -General surgery has been consulted and has asked nothing by mouth after midnight with anticipated cholecystectomy tomorrow morning. -Will follow recommendations. -Normal LFTs -COVID test neg  2-morbid Obesity -Body mass index is 45.19 kg/m. -Low calorie diet, portion control and increase physical activity discussed with patient.  3-history of asthma -No shortness of breath, no wheezing, good oxygen saturation on room air -Will monitor for now and if needed use albuterol. -Prior to admission not taking any medications for asthma -Patient expressed multiple years since experiencing any respiratory problems  4-hyperglycemia -No prior history of diabetes -Will check A1c  5-mild hyponatremia and hypokalemia -In the setting of dehydration, poor oral intake and GI losses -Provide IV fluids -Replete electrolytes -Check magnesium and phosphorus level. -Follow clinical response.    DVT prophylaxis: Heparin Code Status: Full code Family Communication: No family at bedside Disposition Plan: Anticipate discharge back home once medically stable. Consults called: General surgery Admission status: Observation, MedSurg, length of stay less than 2 midnights.   Time Spent: 65 minutes  Barton Dubois MD [facility] Pager [phone]    03/10/2019, 4:43 PM

## 2019-03-10 NOTE — ED Triage Notes (Signed)
Pt reports she was seen yesterday and dx with gallstone in gallbladder neck. Has been unable to keep anything down all night.

## 2019-03-10 NOTE — Progress Notes (Signed)
LOST APPROX 10 LBS DUE TO NAUSEA, VOMITING, & GALLBLADDER RELATED PAIN.

## 2019-03-11 ENCOUNTER — Observation Stay (HOSPITAL_COMMUNITY): Payer: Self-pay | Admitting: Anesthesiology

## 2019-03-11 ENCOUNTER — Encounter (HOSPITAL_COMMUNITY)
Admission: EM | Disposition: A | Discharge status: Home / Self Care | Payer: Self-pay | Source: Home / Self Care | Attending: Emergency Medicine

## 2019-03-11 ENCOUNTER — Encounter (HOSPITAL_COMMUNITY): Payer: Self-pay | Admitting: *Deleted

## 2019-03-11 DIAGNOSIS — K8050 Calculus of bile duct without cholangitis or cholecystitis without obstruction: Secondary | ICD-10-CM

## 2019-03-11 DIAGNOSIS — Z6841 Body Mass Index (BMI) 40.0 and over, adult: Secondary | ICD-10-CM

## 2019-03-11 DIAGNOSIS — R7303 Prediabetes: Secondary | ICD-10-CM

## 2019-03-11 HISTORY — PX: CHOLECYSTECTOMY: SHX55

## 2019-03-11 LAB — BASIC METABOLIC PANEL
Anion gap: 10 (ref 5–15)
BUN: <5 mg/dL — ABNORMAL LOW (ref 6–20)
CO2: 23 mmol/L (ref 22–32)
Calcium: 8.5 mg/dL — ABNORMAL LOW (ref 8.9–10.3)
Chloride: 100 mmol/L (ref 98–111)
Creatinine, Ser: 0.51 mg/dL (ref 0.44–1.00)
GFR calc Af Amer: >60 mL/min (ref >60)
GFR calc non Af Amer: >60 mL/min (ref >60)
Glucose, Bld: 132 mg/dL — ABNORMAL HIGH (ref 70–99)
Potassium: 3.5 mmol/L (ref 3.5–5.1)
Sodium: 133 mmol/L — ABNORMAL LOW (ref 135–145)

## 2019-03-11 LAB — CBC
HCT: 41.4 % (ref 36.0–46.0)
Hemoglobin: 13.6 g/dL (ref 12.0–15.0)
MCH: 28.5 pg (ref 26.0–34.0)
MCHC: 32.9 g/dL (ref 30.0–36.0)
MCV: 86.8 fL (ref 80.0–100.0)
Platelets: 385 10*3/uL (ref 150–400)
RBC: 4.77 MIL/uL (ref 3.87–5.11)
RDW: 12.8 % (ref 11.5–15.5)
WBC: 27.2 10*3/uL — ABNORMAL HIGH (ref 4.0–10.5)
nRBC: 0 % (ref 0.0–0.2)

## 2019-03-11 LAB — HEMOGLOBIN A1C
Hgb A1c MFr Bld: 5.8 % — ABNORMAL HIGH (ref 4.8–5.6)
Mean Plasma Glucose: 119.76 mg/dL

## 2019-03-11 SURGERY — LAPAROSCOPIC CHOLECYSTECTOMY
Anesthesia: General | Site: Abdomen

## 2019-03-11 MED ORDER — MIDAZOLAM HCL 2 MG/2ML IJ SOLN: 0.5000 mg | Freq: Once | INTRAMUSCULAR | Status: DC | PRN

## 2019-03-11 MED ORDER — ESMOLOL HCL 100 MG/10ML IV SOLN
INTRAVENOUS | Status: DC | PRN
  Administered 2019-03-11 (×2): 30 mg via INTRAVENOUS

## 2019-03-11 MED ORDER — ONDANSETRON HCL 4 MG/2ML IJ SOLN: 4.0000 mg | Freq: Four times a day (QID) | INTRAMUSCULAR | Status: DC | PRN

## 2019-03-11 MED ORDER — DIPHENHYDRAMINE HCL 25 MG PO CAPS: 25.0000 mg | ORAL_CAPSULE | Freq: Four times a day (QID) | ORAL | Status: DC | PRN

## 2019-03-11 MED ORDER — MIDAZOLAM HCL 2 MG/2ML IJ SOLN
INTRAMUSCULAR | Status: AC
  Filled 2019-03-11: qty 2

## 2019-03-11 MED ORDER — BUPIVACAINE LIPOSOME 1.3 % IJ SUSP
INTRAMUSCULAR | Status: AC
  Filled 2019-03-11: qty 20

## 2019-03-11 MED ORDER — CHLORHEXIDINE GLUCONATE CLOTH 2 % EX PADS: 6.0000 | MEDICATED_PAD | Freq: Once | CUTANEOUS | Status: DC

## 2019-03-11 MED ORDER — DIPHENHYDRAMINE HCL 50 MG/ML IJ SOLN: 25.0000 mg | Freq: Four times a day (QID) | INTRAMUSCULAR | Status: DC | PRN

## 2019-03-11 MED ORDER — ACETAMINOPHEN 325 MG PO TABS: 650.0000 mg | ORAL_TABLET | Freq: Four times a day (QID) | ORAL | Status: DC | PRN

## 2019-03-11 MED ORDER — ONDANSETRON 4 MG PO TBDP: 4.0000 mg | ORAL_TABLET | Freq: Four times a day (QID) | ORAL | Status: DC | PRN

## 2019-03-11 MED ORDER — FENTANYL CITRATE (PF) 100 MCG/2ML IJ SOLN
INTRAMUSCULAR | Status: DC | PRN
  Administered 2019-03-11: 100 ug via INTRAVENOUS
  Administered 2019-03-11: 50 ug via INTRAVENOUS
  Administered 2019-03-11: 100 ug via INTRAVENOUS

## 2019-03-11 MED ORDER — 0.9 % SODIUM CHLORIDE (POUR BTL) OPTIME
TOPICAL | Status: DC | PRN
  Administered 2019-03-11: 13:00:00 1000 mL

## 2019-03-11 MED ORDER — SODIUM CHLORIDE 0.9 % IV SOLN
INTRAVENOUS | Status: DC
  Administered 2019-03-11 – 2019-03-12 (×2): via INTRAVENOUS

## 2019-03-11 MED ORDER — LIDOCAINE HCL (CARDIAC) PF 100 MG/5ML IV SOSY
PREFILLED_SYRINGE | INTRAVENOUS | Status: DC | PRN
  Administered 2019-03-11: 40 mg via INTRATRACHEAL

## 2019-03-11 MED ORDER — HYDROMORPHONE HCL 1 MG/ML IJ SOLN
0.2500 mg | INTRAMUSCULAR | Status: DC | PRN
  Administered 2019-03-11 (×3): 0.5 mg via INTRAVENOUS
  Filled 2019-03-11 (×3): qty 0.5

## 2019-03-11 MED ORDER — POVIDONE-IODINE 10 % EX OINT
TOPICAL_OINTMENT | CUTANEOUS | Status: AC
  Filled 2019-03-11: qty 1

## 2019-03-11 MED ORDER — ROCURONIUM BROMIDE 10 MG/ML (PF) SYRINGE
PREFILLED_SYRINGE | INTRAVENOUS | Status: DC | PRN
  Administered 2019-03-11: 60 mg via INTRAVENOUS

## 2019-03-11 MED ORDER — CHLORHEXIDINE GLUCONATE CLOTH 2 % EX PADS
6.0000 | MEDICATED_PAD | Freq: Every day | CUTANEOUS | Status: DC
  Administered 2019-03-11 – 2019-03-12 (×2): 6 via TOPICAL

## 2019-03-11 MED ORDER — SODIUM CHLORIDE 0.9 % IR SOLN
Status: DC | PRN
  Administered 2019-03-11: 3000 mL

## 2019-03-11 MED ORDER — PROMETHAZINE HCL 25 MG/ML IJ SOLN: 6.2500 mg | INTRAMUSCULAR | Status: DC | PRN

## 2019-03-11 MED ORDER — SODIUM CHLORIDE 0.9% FLUSH
INTRAVENOUS | Status: AC
  Filled 2019-03-11: qty 30

## 2019-03-11 MED ORDER — VANCOMYCIN HCL 10 G IV SOLR
1500.0000 mg | INTRAVENOUS | Status: AC
  Administered 2019-03-11: 1500 mg via INTRAVENOUS

## 2019-03-11 MED ORDER — LACTATED RINGERS IV SOLN
INTRAVENOUS | Status: DC
  Administered 2019-03-11: 12:00:00 via INTRAVENOUS

## 2019-03-11 MED ORDER — ALBUTEROL SULFATE (2.5 MG/3ML) 0.083% IN NEBU: 2.5000 mg | INHALATION_SOLUTION | RESPIRATORY_TRACT | Status: DC | PRN

## 2019-03-11 MED ORDER — PROPOFOL 10 MG/ML IV BOLUS
INTRAVENOUS | Status: AC
  Filled 2019-03-11: qty 20

## 2019-03-11 MED ORDER — HEMOSTATIC AGENTS (NO CHARGE) OPTIME
TOPICAL | Status: DC | PRN
  Administered 2019-03-11 (×3): 1 via TOPICAL

## 2019-03-11 MED ORDER — MIDAZOLAM HCL 5 MG/5ML IJ SOLN
INTRAMUSCULAR | Status: DC | PRN
  Administered 2019-03-11: 2 mg via INTRAVENOUS

## 2019-03-11 MED ORDER — VANCOMYCIN HCL 10 G IV SOLR
INTRAVENOUS | Status: AC
  Filled 2019-03-11: qty 1500

## 2019-03-11 MED ORDER — SUGAMMADEX SODIUM 500 MG/5ML IV SOLN
INTRAVENOUS | Status: DC | PRN
  Administered 2019-03-11: 500 mg via INTRAVENOUS

## 2019-03-11 MED ORDER — KETOROLAC TROMETHAMINE 30 MG/ML IJ SOLN
30.0000 mg | Freq: Four times a day (QID) | INTRAMUSCULAR | Status: AC
  Administered 2019-03-11: 30 mg via INTRAVENOUS
  Filled 2019-03-11: qty 1

## 2019-03-11 MED ORDER — OXYCODONE-ACETAMINOPHEN 5-325 MG PO TABS
1.0000 | ORAL_TABLET | ORAL | Status: DC | PRN
  Administered 2019-03-11: 2 via ORAL
  Filled 2019-03-11: qty 2

## 2019-03-11 MED ORDER — PIPERACILLIN-TAZOBACTAM 3.375 G IVPB
3.3750 g | Freq: Three times a day (TID) | INTRAVENOUS | Status: DC
  Administered 2019-03-11 – 2019-03-12 (×3): 3.375 g via INTRAVENOUS
  Filled 2019-03-11 (×3): qty 50

## 2019-03-11 MED ORDER — LORAZEPAM 2 MG/ML IJ SOLN: 1.0000 mg | INTRAMUSCULAR | Status: DC | PRN

## 2019-03-11 MED ORDER — ENOXAPARIN SODIUM 40 MG/0.4ML ~~LOC~~ SOLN: 40.0000 mg | SUBCUTANEOUS | Status: DC

## 2019-03-11 MED ORDER — PHENYLEPHRINE HCL (PRESSORS) 10 MG/ML IV SOLN
INTRAVENOUS | Status: DC | PRN
  Administered 2019-03-11: 80 ug via INTRAVENOUS

## 2019-03-11 MED ORDER — HYDROMORPHONE HCL 1 MG/ML IJ SOLN
1.0000 mg | INTRAMUSCULAR | Status: DC | PRN
  Administered 2019-03-11 – 2019-03-12 (×2): 1 mg via INTRAVENOUS
  Filled 2019-03-11 (×2): qty 1

## 2019-03-11 MED ORDER — SIMETHICONE 80 MG PO CHEW: 40.0000 mg | CHEWABLE_TABLET | Freq: Four times a day (QID) | ORAL | Status: DC | PRN

## 2019-03-11 MED ORDER — HYDROCODONE-ACETAMINOPHEN 7.5-325 MG PO TABS: 1.0000 | ORAL_TABLET | Freq: Once | ORAL | Status: DC | PRN

## 2019-03-11 MED ORDER — FENTANYL CITRATE (PF) 250 MCG/5ML IJ SOLN
INTRAMUSCULAR | Status: AC
  Filled 2019-03-11: qty 5

## 2019-03-11 MED ORDER — MUPIROCIN 2 % EX OINT
1.0000 "application " | TOPICAL_OINTMENT | Freq: Two times a day (BID) | CUTANEOUS | Status: DC
  Administered 2019-03-11 (×2): 1 via NASAL
  Filled 2019-03-11 (×2): qty 22

## 2019-03-11 MED ORDER — KETOROLAC TROMETHAMINE 30 MG/ML IJ SOLN: 30.0000 mg | Freq: Four times a day (QID) | INTRAMUSCULAR | Status: DC | PRN

## 2019-03-11 MED ORDER — ACETAMINOPHEN 650 MG RE SUPP: 650.0000 mg | Freq: Four times a day (QID) | RECTAL | Status: DC | PRN

## 2019-03-11 MED ORDER — PROPOFOL 10 MG/ML IV BOLUS
INTRAVENOUS | Status: DC | PRN
  Administered 2019-03-11: 200 mg via INTRAVENOUS

## 2019-03-11 SURGICAL SUPPLY — 62 items
ADH SKN CLS APL DERMABOND .7 (GAUZE/BANDAGES/DRESSINGS) ×1
APL PRP STRL LF DISP 70% ISPRP (MISCELLANEOUS) ×1
APL SRG 38 LTWT LNG FL B (MISCELLANEOUS) ×1
APPLICATOR ARISTA FLEXITIP XL (MISCELLANEOUS) ×2 IMPLANT
APPLIER CLIP ROT 10 11.4 M/L (STAPLE) ×3
APR CLP MED LRG 11.4X10 (STAPLE) ×1
BAG RETRIEVAL 10 (BASKET) ×1
BAG RETRIEVAL 10MM (BASKET) ×1
CHLORAPREP W/TINT 26 (MISCELLANEOUS) ×3 IMPLANT
CLIP APPLIE ROT 10 11.4 M/L (STAPLE) ×1 IMPLANT
CLOTH BEACON ORANGE TIMEOUT ST (SAFETY) ×3 IMPLANT
COVER LIGHT HANDLE STERIS (MISCELLANEOUS) ×6 IMPLANT
COVER WAND RF STERILE (DRAPES) ×3 IMPLANT
DERMABOND ADVANCED (GAUZE/BANDAGES/DRESSINGS) ×2
DERMABOND ADVANCED .7 DNX12 (GAUZE/BANDAGES/DRESSINGS) ×1 IMPLANT
ELECT REM PT RETURN 9FT ADLT (ELECTROSURGICAL) ×3
ELECTRODE REM PT RTRN 9FT ADLT (ELECTROSURGICAL) ×1 IMPLANT
FILTER SMOKE EVAC LAPAROSHD (FILTER) ×3 IMPLANT
GLOVE BIO SURGEON STRL SZ 6.5 (GLOVE) ×1 IMPLANT
GLOVE BIO SURGEON STRL SZ7 (GLOVE) ×2 IMPLANT
GLOVE BIO SURGEONS STRL SZ 6.5 (GLOVE) ×1
GLOVE BIOGEL PI IND STRL 6.5 (GLOVE) IMPLANT
GLOVE BIOGEL PI IND STRL 7.0 (GLOVE) ×1 IMPLANT
GLOVE BIOGEL PI INDICATOR 6.5 (GLOVE) ×2
GLOVE BIOGEL PI INDICATOR 7.0 (GLOVE) ×4
GLOVE SURG SS PI 7.5 STRL IVOR (GLOVE) ×3 IMPLANT
GOWN STRL REUS W/TWL LRG LVL3 (GOWN DISPOSABLE) ×9 IMPLANT
HEMOSTAT ARISTA ABSORB 3G PWDR (HEMOSTASIS) ×2 IMPLANT
HEMOSTAT SNOW SURGICEL 2X4 (HEMOSTASIS) ×3 IMPLANT
HEMOSTAT SURGICEL 4X8 (HEMOSTASIS) ×2 IMPLANT
INST SET LAPROSCOPIC AP (KITS) ×3 IMPLANT
IV NS IRRIG 3000ML ARTHROMATIC (IV SOLUTION) ×2 IMPLANT
KIT TURNOVER KIT A (KITS) ×3 IMPLANT
MANIFOLD NEPTUNE II (INSTRUMENTS) ×3 IMPLANT
NDL HYPO 18GX1.5 BLUNT FILL (NEEDLE) ×1 IMPLANT
NDL INSUFFLATION 14GA 120MM (NEEDLE) ×1 IMPLANT
NEEDLE HYPO 18GX1.5 BLUNT FILL (NEEDLE) ×3 IMPLANT
NEEDLE HYPO 22GX1.5 SAFETY (NEEDLE) ×3 IMPLANT
NEEDLE INSUFFLATION 14GA 120MM (NEEDLE) ×3 IMPLANT
NS IRRIG 1000ML POUR BTL (IV SOLUTION) ×3 IMPLANT
PACK LAP CHOLE LZT030E (CUSTOM PROCEDURE TRAY) ×3 IMPLANT
PAD ARMBOARD 7.5X6 YLW CONV (MISCELLANEOUS) ×3 IMPLANT
PENCIL HANDSWITCHING (ELECTRODE) ×2 IMPLANT
SET BASIN LINEN APH (SET/KITS/TRAYS/PACK) ×3 IMPLANT
SET TUBE IRRIG SUCTION NO TIP (IRRIGATION / IRRIGATOR) ×2 IMPLANT
SLEEVE ENDOPATH XCEL 5M (ENDOMECHANICALS) ×3 IMPLANT
SPONGE GAUZE 2X2 8PLY STER LF (GAUZE/BANDAGES/DRESSINGS) ×4
SPONGE GAUZE 2X2 8PLY STRL LF (GAUZE/BANDAGES/DRESSINGS) ×4 IMPLANT
STAPLER VISISTAT (STAPLE) ×2 IMPLANT
SUT MNCRL AB 4-0 PS2 18 (SUTURE) ×3 IMPLANT
SUT VICRYL 0 UR6 27IN ABS (SUTURE) ×3 IMPLANT
SYR 20CC LL (SYRINGE) ×3 IMPLANT
SYS BAG RETRIEVAL 10MM (BASKET) ×1
SYSTEM BAG RETRIEVAL 10MM (BASKET) ×1 IMPLANT
TAPE PAPER 2X10 WHT MICROPORE (GAUZE/BANDAGES/DRESSINGS) ×2 IMPLANT
TROCAR ENDO BLADELESS 11MM (ENDOMECHANICALS) ×3 IMPLANT
TROCAR XCEL NON-BLD 5MMX100MML (ENDOMECHANICALS) ×3 IMPLANT
TROCAR XCEL UNIV SLVE 11M 100M (ENDOMECHANICALS) ×3 IMPLANT
TUBE CONNECTING 12'X1/4 (SUCTIONS) ×1
TUBE CONNECTING 12X1/4 (SUCTIONS) ×2 IMPLANT
TUBING INSUFFLATION (TUBING) ×3 IMPLANT
WARMER LAPAROSCOPE (MISCELLANEOUS) ×3 IMPLANT

## 2019-03-11 NOTE — Progress Notes (Signed)
PROGRESS NOTE    Dorothy HammedJennifer L Flowers  WJX:[id number] DOB: 1986-01-23 DOA: 03/10/2019 PCP: Patient, No Pcp Per     Brief Narrative:  33 y.o. female with past medical history significant for morbid obesity and history of asthma; presented to emergency department secondary to abdominal pain, nausea and vomiting.  Patient reports symptom has been present since 720-second 20 and worsening.  She was seen in the emergency department on 03/09/2019 for evaluation demonstrated cholelithiasis without acute cholecystitis.  Her symptoms were controlled with IV pain medications, antiemetics and discharged home with instruction to follow-up with general surgery as an outpatient.  Patient expressed that her pain continue progressing or becomes intolerant, 10 out of 10 in intensity, localized in her right upper quadrant and radiates to her back, associated nausea/vomiting without hematemesis, unable to keep anything down.  Patient also expressed experiencing chills at home but did not take her temperature.  She denies sick contacts, productive cough, shortness of breath, hematuria, dysuria, melena, hematochezia, headaches, blurred vision, focal weakness or any other complaints.  In the ED patient work-up demonstrated mild hyponatremia and hypokalemia, hyperglycemia, elevated WBCs in the 15, 000 range and severe right upper quadrant pain on examination.  General surgery was consulted and recommended admission to the hospital with intention for cholecystectomy in the morning.  IV fluids resuscitation initiated and the patient started on IV antibiotics.  TRH has been consulted to place patient in the hospital for further evaluation and management.    Assessment & Plan: 1-abdominal pain, nausea/vomiting: In the setting of cholelithiasis with acute cholecystitis. -Patient is afebrile -Continue having right upper quadrant pain, nausea vomiting -WBCs trending up -Continue supportive care -Continue IV antibiotics -Plan  is for cholecystectomy later today -Will follow general surgery recommendations for postoperative management.  2-morbid obesity -Body mass index is 45.19 kg/m. -Low calorie diet, portion control and increase physical activity has been discussed with patient.  3-prediabetes: -Patient presented with hyperglycemia -No prior history of diabetes, but extensive family history for it. -A1c 5.8 -Patient will benefit of modified carbohydrate diet, weight loss and most likely initiation of low-dose metformin.  4-mild hyponatremia and hypokalemia -Electrolytes have been appropriately repleted -Continue IV fluids -Follow trend intermittently and further replete as needed  5-hx of asthma -stable and well controlled.  -no complaints of SOB.  DVT prophylaxis: Heparin Code Status: Full code Family Communication: No family at bedside. Disposition Plan: Continue supportive care, IV antibiotics and PRN antiemetics/analgesics.  General surgery with plan for laparoscopic cholecystectomy later today; will follow recommendation for postoperative management.  Consultants:   General surgery  Procedures:   Anticipated laparoscopic cholecystectomy later today.  Antimicrobials:  Anti-infectives (From admission, onward)   Start     Dose/Rate Route Frequency Ordered Stop   03/11/19 1230  vancomycin (VANCOCIN) 1,500 mg in sodium chloride 0.9 % 500 mL IVPB     1,500 mg 250 mL/hr over 120 Minutes Intravenous On call to O.R. 03/11/19 1148 03/11/19 1430   03/11/19 0915  [MAR Hold]  piperacillin-tazobactam (ZOSYN) IVPB 3.375 g     (MAR Hold since Mon 03/11/2019 at 1110.Hold Reason: Transfer to a Procedural area.)   3.375 g 12.5 mL/hr over 240 Minutes Intravenous Every 8 hours 03/11/19 0911     03/10/19 1800  Ampicillin-Sulbactam (UNASYN) 3 g in sodium chloride 0.9 % 100 mL IVPB  Status:  Discontinued     3 g 200 mL/hr over 30 Minutes Intravenous Every 6 hours 03/10/19 1207 03/11/19 0843   03/10/19 1130   piperacillin-tazobactam (  ZOSYN) IVPB 3.375 g     3.375 g 100 mL/hr over 30 Minutes Intravenous  Once 03/10/19 1119 03/10/19 1231       Subjective: Afebrile, no chest pain or shortness of breath.  Continue complaining of right upper quadrant pain, nausea and vomiting.  Objective: Vitals:   03/11/19 0645 03/11/19 1126 03/11/19 1415 03/11/19 1430  BP: 132/76 131/82 132/83 139/86  Pulse: (!) 102 (!) 110 (!) 104 (!) 102  Resp: 16 18 13  (!) 31  Temp: 99.8 F (37.7 C) 99.2 F (37.3 C) 99.8 F (37.7 C)   TempSrc: Oral Oral    SpO2: 94% 98% 96%   Weight:      Height:        Intake/Output Summary (Last 24 hours) at 03/11/2019 1444 Last data filed at 03/11/2019 1422 Gross per 24 hour  Intake 2492.06 ml  Output 25 ml  Net 2467.06 ml   Filed Weights   03/10/19 0911  Weight: 127 kg    Examination: General exam: Alert, awake, oriented x 3; still complaining of right upper quadrant pain and intermittent episodes of nausea/vomiting.  No fever Respiratory system: Clear to auscultation. Respiratory effort normal. Cardiovascular system:RRR. No murmurs, rubs, gallops. Gastrointestinal system: Abdomen is obese, nondistended, soft and tender to palpation in her right upper quadrant and midepigastric area.  Positive bowel sounds. Central nervous system: Alert and oriented. No focal neurological deficits. Extremities: No C/C/E, +pedal pulses Skin: No rashes, lesions or ulcers Psychiatry: Judgement and insight appear normal. Mood & affect appropriate.     Data Reviewed: I have personally reviewed following labs and imaging studies  CBC: Recent Labs  Lab 03/09/19 0840 03/10/19 0938 03/11/19 0613  WBC 11.5* 15.9* 27.2*  NEUTROABS 8.9* 13.7*  --   HGB 13.5 13.9 13.6  HCT 41.5 42.1 41.4  MCV 87.9 86.6 86.8  PLT 407* 436* 385   Basic Metabolic Panel: Recent Labs  Lab 03/09/19 0840 03/10/19 0938 03/10/19 1700 03/11/19 0613  NA 135 133*  --  133*  K 4.1 3.3*  --  3.5  CL 101 98   --  100  CO2 23 22  --  23  GLUCOSE 104* 124*  --  132*  BUN 8 7  --  <5*  CREATININE 0.62 0.62  --  0.51  CALCIUM 8.8* 9.1  --  8.5*  MG  --   --  1.9  --   PHOS  --   --  2.8  --    GFR: Estimated Creatinine Clearance: 135.2 mL/min (by C-G formula based on SCr of 0.51 mg/dL).   Liver Function Tests: Recent Labs  Lab 03/09/19 0840 03/10/19 0938  AST 25 20  ALT 17 21  ALKPHOS 92 90  BILITOT 0.8 0.7  PROT 8.1 7.9  ALBUMIN 3.9 3.9   Recent Labs  Lab 03/09/19 0840 03/10/19 0938  LIPASE 16 16   HbA1C: Recent Labs    03/10/19 1700  HGBA1C 5.8*   Urine analysis:    Component Value Date/Time   COLORURINE YELLOW 03/09/2019 0854   APPEARANCEUR CLEAR 03/09/2019 0854   APPEARANCEUR Clear 03/15/2016 1158   LABSPEC 1.006 03/09/2019 0854   PHURINE 8.0 03/09/2019 0854   GLUCOSEU NEGATIVE 03/09/2019 0854   HGBUR SMALL (A) 03/09/2019 0854   BILIRUBINUR NEGATIVE 03/09/2019 0854   BILIRUBINUR Negative 03/15/2016 1158   KETONESUR 5 (A) 03/09/2019 0854   PROTEINUR NEGATIVE 03/09/2019 0854   UROBILINOGEN 0.2 01/01/2015 0938   NITRITE NEGATIVE 03/09/2019 0854  LEUKOCYTESUR NEGATIVE 03/09/2019 0854    Recent Results (from the past 240 hour(s))  Urine culture     Status: Abnormal   Collection Time: 03/09/19  8:40 AM   Specimen: Urine, Clean Catch  Result Value Ref Range Status   Specimen Description   Final    URINE, CLEAN CATCH Performed at [facility], [address] [place], [place] [address]    Special Requests   Final    NONE Performed at [facility], [address] [place], [place] [address]    Culture (A)  Final    <10,000 COLONIES/mL INSIGNIFICANT GROWTH Performed at [facility], [address] [place], [place] [address]    Report Status 03/10/2019 FINAL  Final  SARS Coronavirus 2 (CEPHEID - Performed in [facility] hospital lab), Hosp Order     Status: None   Collection Time: 03/10/19  9:20 AM   Specimen: Nasopharyngeal Swab  Result Value Ref  Range Status   SARS Coronavirus 2 NEGATIVE NEGATIVE Final    Comment: (NOTE) If result is NEGATIVE SARS-CoV-2 target nucleic acids are NOT DETECTED. The SARS-CoV-2 RNA is generally detectable in upper and lower  respiratory specimens during the acute phase of infection. The lowest  concentration of SARS-CoV-2 viral copies this assay can detect is 250  copies / mL. A negative result does not preclude SARS-CoV-2 infection  and should not be used as the sole basis for treatment or other  patient management decisions.  A negative result may occur with  improper specimen collection / handling, submission of specimen other  than nasopharyngeal swab, presence of viral mutation(s) within the  areas targeted by this assay, and inadequate number of viral copies  (<250 copies / mL). A negative result must be combined with clinical  observations, patient history, and epidemiological information. If result is POSITIVE SARS-CoV-2 target nucleic acids are DETECTED. The SARS-CoV-2 RNA is generally detectable in upper and lower  respiratory specimens dur ing the acute phase of infection.  Positive  results are indicative of active infection with SARS-CoV-2.  Clinical  correlation with patient history and other diagnostic information is  necessary to determine patient infection status.  Positive results do  not rule out bacterial infection or co-infection with other viruses. If result is PRESUMPTIVE POSTIVE SARS-CoV-2 nucleic acids MAY BE PRESENT.   A presumptive positive result was obtained on the submitted specimen  and confirmed on repeat testing.  While 2019 novel coronavirus  (SARS-CoV-2) nucleic acids may be present in the submitted sample  additional confirmatory testing may be necessary for epidemiological  and / or clinical management purposes  to differentiate between  SARS-CoV-2 and other Sarbecovirus currently known to infect humans.  If clinically indicated additional testing with an  alternate test  methodology [phone]([id number]) is advised. The SARS-CoV-2 RNA is generally  detectable in upper and lower respiratory sp ecimens during the acute  phase of infection. The expected result is Negative. Fact Sheet for Patients:  BoilerBrush.com.cyhttps://www.fda.gov/media/136312/download Fact Sheet for Healthcare Providers: https://pope.com/https://www.fda.gov/media/136313/download This test is not yet approved or cleared by the [place] FDA and has been authorized for detection and/or diagnosis of SARS-CoV-2 by FDA under an Emergency Use Authorization (EUA).  This EUA will remain in effect (meaning this test can be used) for the duration of the COVID-19 declaration under Section 564(b)(1) of the Act, 21 U.S.C. section 360bbb-3(b)(1), unless the authorization is terminated or revoked sooner. Performed at [facility], [address] [place], [place] [address]   Surgical pcr  screen     Status: Abnormal   Collection Time: 03/10/19  9:00 PM   Specimen: Nasal Mucosa; Nasal Swab  Result Value Ref Range Status   MRSA, PCR POSITIVE (A) NEGATIVE Final    Comment: RESULT CALLED TO, READ BACK BY AND VERIFIED WITH: THOMAS,K @ 2328 ON 03/10/19 BY JUW    Staphylococcus aureus POSITIVE (A) NEGATIVE Final    Comment: (NOTE) The Xpert SA Assay (FDA approved for NASAL specimens in patients 62 years of age and older), is one component of a comprehensive surveillance program. It is not intended to diagnose infection nor to guide or monitor treatment. Performed at [facility], [address], [place], [place] [address]      Scheduled Meds: . [MAR Hold] Chlorhexidine Gluconate Cloth  6 each Topical Q0600  . Chlorhexidine Gluconate Cloth  6 each Topical Once   And  . Chlorhexidine Gluconate Cloth  6 each Topical Once  . [MAR Hold] heparin injection (subcutaneous)  5,000 Units Subcutaneous Q8H  . [MAR Hold] mupirocin ointment  1 application Nasal BID   Continuous Infusions: . 0.9 % NaCl with KCl 20 mEq / L 100 mL/hr at  03/11/19 0412  . lactated ringers 50 mL/hr at 03/11/19 1148  . [MAR Hold] piperacillin-tazobactam (ZOSYN)  IV 3.375 g (03/11/19 0928)     LOS: 0 days    Time spent: 30 minutes.    Barton Dubois, MD [facility] Pager [phone]   03/11/2019, 2:44 PM

## 2019-03-11 NOTE — Progress Notes (Signed)
Pharmacy Antibiotic Note  Dorothy Flowers is a 33 y.o. female admitted on 03/10/2019 with intra-abdominal infection.  Pharmacy has been consulted for zosyn dosing.  Plan: Zosyn 3.375gm IV q8h EID over 4 hours F/U cxs and clinical progress Monitor V/S, labs  Height: 5\' 6"  (167.6 cm) Weight: 279 lb 15.8 oz (127 kg) IBW/kg (Calculated) : 59.3  Temp (24hrs), Avg:99.1 F (37.3 C), Min:97.9 F (36.6 C), Max:99.8 F (37.7 C)  Recent Labs  Lab 03/09/19 0840 03/10/19 0938 03/11/19 0613  WBC 11.5* 15.9* 27.2*  CREATININE 0.62 0.62 0.51    Estimated Creatinine Clearance: 135.2 mL/min (by C-G formula based on SCr of 0.51 mg/dL).    Allergies  Allergen Reactions  . Cephalosporins Shortness Of Breath, Swelling and Other (See Comments)    Patient says she can take PCN and amoxicillin  . Keflex [Cephalexin] Shortness Of Breath and Rash  . Adhesive [Tape] Itching and Rash    Antimicrobials this admission: Unasyn 7/26 >> 7/27 Zosyn 7/27>> Zosyn 3.375gm IV x 1 in ED  Microbiology results: 7/25 UC: insignificant growth 7/25 MRSA PCR is positive SARS-2 CV is negative Thank you for allowing pharmacy to be a part of this patient's care.  Isac Sarna, BS [place], [place] Clinical Pharmacist Pager [phone] 03/11/2019 8:51 AM

## 2019-03-11 NOTE — Transfer of Care (Signed)
Immediate Anesthesia Transfer of Care Note  Patient: Dorothy Flowers  Procedure(s) Performed: LAPAROSCOPIC CHOLECYSTECTOMY (N/A Abdomen)  Patient Location: PACU  Anesthesia Type:General  Level of Consciousness: awake, alert  and oriented  Airway & Oxygen Therapy: Patient Spontanous Breathing and Patient connected to face mask oxygen  Post-op Assessment: Report given to RN, Post -op Vital signs reviewed and stable and Patient moving all extremities X 4  Post vital signs: Reviewed and stable  Last Vitals:  Vitals Value Taken Time  BP 132/83 03/11/19 1415  Temp    Pulse 104 03/11/19 1421  Resp 42 03/11/19 1421  SpO2 99 % 03/11/19 1421  Vitals shown include unvalidated device data.  Last Pain:  Vitals:   03/11/19 1126  TempSrc: Oral  PainSc: 5       Patients Stated Pain Goal: 0 (05/31/50 0258)  Complications: No apparent anesthesia complications

## 2019-03-11 NOTE — Anesthesia Preprocedure Evaluation (Signed)
Anesthesia Evaluation  Patient identified by MRN, date of birth, ID band Patient awake    Reviewed: Allergy & Precautions, NPO status , Patient's Chart, lab work & pertinent test results  Airway Mallampati: I  TM Distance: >3 FB Neck ROM: Full    Dental no notable dental hx. (+) Poor Dentition, Chipped, Missing   Pulmonary asthma , former smoker,  Reports childhood asthma -no inhaler use for years  Snores  BMI >45   Pulmonary exam normal breath sounds clear to auscultation       Cardiovascular Exercise Tolerance: Good negative cardio ROS Normal cardiovascular examI Rhythm:Regular Rate:Normal     Neuro/Psych negative neurological ROS  negative psych ROS   GI/Hepatic negative GI ROS, Neg liver ROS,   Endo/Other  Morbid obesity  Renal/GU negative Renal ROS  negative genitourinary   Musculoskeletal negative musculoskeletal ROS (+)   Abdominal   Peds negative pediatric ROS (+)  Hematology negative hematology ROS (+)   Anesthesia Other Findings MRSA +  Reproductive/Obstetrics negative OB ROS                             Anesthesia Physical Anesthesia Plan  ASA: III  Anesthesia Plan: General   Post-op Pain Management:    Induction: Intravenous  PONV Risk Score and Plan: Ondansetron, Dexamethasone and Treatment may vary due to age or medical condition  Airway Management Planned: Oral ETT  Additional Equipment:   Intra-op Plan:   Post-operative Plan: Extubation in OR  Informed Consent: I have reviewed the patients History and Physical, chart, labs and discussed the procedure including the risks, benefits and alternatives for the proposed anesthesia with the patient or authorized representative who has indicated his/her understanding and acceptance.     Dental advisory given  Plan Discussed with: CRNA  Anesthesia Plan Comments: (Plan Full PPE use  Plan GETA -WTP with same  after Q&A)        Anesthesia Quick Evaluation

## 2019-03-11 NOTE — Anesthesia Procedure Notes (Signed)
Procedure Name: Intubation Date/Time: 03/11/2019 1:00 PM Performed by: Lenice Llamas, MD Pre-anesthesia Checklist: Patient identified, Patient being monitored, Timeout performed, Emergency Drugs available and Suction available Patient Re-evaluated:Patient Re-evaluated prior to induction Oxygen Delivery Method: Circle System Utilized Preoxygenation: Pre-oxygenation with 100% oxygen Induction Type: IV induction Ventilation: Mask ventilation without difficulty Laryngoscope Size: Mac and 3 Grade View: Grade II Tube type: Oral Tube size: 7.0 mm Number of attempts: 1 Airway Equipment and Method: stylet Placement Confirmation: ETT inserted through vocal cords under direct vision,  positive ETCO2 and breath sounds checked- equal and bilateral Secured at: 21 cm Tube secured with: Tape Dental Injury: Teeth and Oropharynx as per pre-operative assessment

## 2019-03-11 NOTE — Op Note (Signed)
Patient:  Dorothy Flowers  DOB:  [birth year]  MRN:  [id number]   Preop Diagnosis: Acute cholecystitis, cholelithiasis  Postop Diagnosis: Same  Procedure: Laparoscopic cholecystectomy  Surgeon: Aviva Signs, MD  Assistant: Curlene Labrum, MD  Anes: General endotracheal  Indications: Patient is a morbidly obese 33 year old white female who presents with acute cholecystitis secondary to cholelithiasis.  The risks and benefits of the procedure including bleeding, infection, hepatobiliary injury, and the possibility of an open procedure were fully explained to the patient, who gave informed consent.  Procedure note: The patient was placed in the supine position.  After induction of general endotracheal anesthesia, the abdomen was prepped and draped using the usual sterile technique with ChloraPrep.  Surgical site confirmation was performed.  Is a supraumbilical incision was made down to the fascia.  A Veress needle was introduced into the abdominal cavity and confirmation of placement was done using the saline drop test.  The abdomen was then insufflated to 17 mmHg pressure.  An 11 mm trocar was introduced into the abdominal cavity under direct visualization without difficulty.  The patient was placed in reverse Trendelenburg position and an additional 11 mm trocar was placed in the epigastric region and 5 mm trochars were placed in the right upper quadrant and right flank regions.  The liver was inspected and noted to be somewhat swollen and fragile.  The gallbladder was noted to be diffusely inflamed with areas of gangrenous changes.  The gallbladder was decompressed at the fundus in order to facilitate exposure.  The gallbladder was retracted in a dynamic fashion in order to provide a critical view of the triangle of Calot.  The cystic duct was first identified.  Its junction to the infundibulum was fully identified.  Endoclips were placed proximally and distally on the cystic duct, and the  cystic duct was divided.  This was likewise doubly the cystic artery.  The gallbladder was freed away from the gallbladder fossa using Bovie electrocautery.  The gallbladder was delivered through the epigastric trocar site using an Endo Catch bag.  The gallbladder fossa was inspected and no abnormal bleeding or bile leakage was noted.  Surgicel and Arista were placed in the gallbladder fossa.  All fluid and air were then evacuated from the abdominal cavity prior to the removal of the trochars.  All wounds were irrigated with normal saline.  All wounds were injected with Exparel.  The epigastric fascia was reapproximated using an 0 Vicryl interrupted suture.  All skin incisions were closed using staples.  Betadine ointment and dry sterile dressings were applied.  All tape and needle counts were correct at the end of the procedure.  The patient was extubated in the operating room and transferred to PACU in stable condition.  Complications: None  EBL: Minimal  Specimen: Gallbladder

## 2019-03-11 NOTE — Interval H&P Note (Signed)
History and Physical Interval Note:  03/11/2019 12:21 PM  Dorothy Flowers  has presented today for surgery, with the diagnosis of acute cholecystitis, cholelithiasis.  The various methods of treatment have been discussed with the patient and family. After consideration of risks, benefits and other options for treatment, the patient has consented to  Procedure(s): LAPAROSCOPIC CHOLECYSTECTOMY (N/A) as a surgical intervention.  The patient's history has been reviewed, patient examined, no change in status, stable for surgery.  I have reviewed the patient's chart and labs.  Questions were answered to the patient's satisfaction.     Aviva Signs

## 2019-03-11 NOTE — Consult Note (Signed)
Reason for Consult: Acute cholecystitis, cholelithiasis Referring Physician: Dr. Fredirick MaudlinMadera  Dorothy Flowers is an 33 y.o. female.  HPI: Patient is a 33 year old morbidly obese white female who presented back to the emergency room on 03/10/2019 with worsening right upper quadrant abdominal pain and nausea.  Patient was seen in the ER a day earlier and diagnosed with cholelithiasis and biliary colic.  She was discharged home and was supposed to see a general surgeon in the office later in the week.  She stated the pain got worse and she presented back to the emergency room.  She was admitted to the hospital for further evaluation and treatment.  She was noted to have an elevated leukocytosis at 15,000.  Her LFTs were within normal limits.  Ultrasound the gallbladder revealed a single stone in the neck of the gallbladder.  Past Medical History:  Diagnosis Date  . Asthma    as child  . MRSA (methicillin resistant Staphylococcus aureus)     Past Surgical History:  Procedure Laterality Date  . CESAREAN SECTION N/A 10/07/2016   Procedure: CESAREAN SECTION;  Surgeon: Dorothy StaggersMichael L Ervin, MD;  [facility];  Service: Obstetrics;  Laterality: N/A;  . WISDOM TOOTH EXTRACTION      Family History  Problem Relation Age of Onset  . Hypertension Mother   . Stroke Mother   . Diabetes Maternal Aunt   . Diabetes Maternal Grandmother   . Heart disease Father   . Hypertension Father   . Stroke Father   . COPD Paternal Grandmother        smokes  . Cancer Paternal Grandfather   . Heart disease Paternal Grandfather   . Mental illness Paternal Uncle   . Cleft lip Son   . Cleft palate Son     Social History:  reports that she has quit smoking. Her smoking use included cigarettes. She smoked 0.00 packs per day. She has never used smokeless tobacco. She reports current alcohol use. She reports that she does not use drugs.  Allergies:  Allergies  Allergen Reactions  . Cephalosporins  Shortness Of Breath, Swelling and Other (See Comments)    Patient says she can take PCN and amoxicillin  . Keflex [Cephalexin] Shortness Of Breath and Rash  . Adhesive [Tape] Itching and Rash    Medications: I have reviewed the patient's current medications.  Results for orders placed or performed during the hospital encounter of 03/10/19 (from the past 48 hour(s))  SARS Coronavirus 2 (CEPHEID - Performed in [facility] lab), Hosp Order     Status: None   Collection Time: 03/10/19  9:20 AM   Specimen: Nasopharyngeal Swab  Result Value Ref Range   SARS Coronavirus 2 NEGATIVE NEGATIVE    Comment: (NOTE) If result is NEGATIVE SARS-CoV-2 target nucleic acids are NOT DETECTED. The SARS-CoV-2 RNA is generally detectable in upper and lower  respiratory specimens during the acute phase of infection. The lowest  concentration of SARS-CoV-2 viral copies this assay can detect is 250  copies / mL. A negative result does not preclude SARS-CoV-2 infection  and should not be used as the sole basis for treatment or other  patient management decisions.  A negative result may occur with  improper specimen collection / handling, submission of specimen other  than nasopharyngeal swab, presence of viral mutation(s) within the  areas targeted by this assay, and inadequate number of viral copies  (<250 copies / mL). A negative result must be combined with clinical  observations,  patient history, and epidemiological information. If result is POSITIVE SARS-CoV-2 target nucleic acids are DETECTED. The SARS-CoV-2 RNA is generally detectable in upper and lower  respiratory specimens dur ing the acute phase of infection.  Positive  results are indicative of active infection with SARS-CoV-2.  Clinical  correlation with patient history and other diagnostic information is  necessary to determine patient infection status.  Positive results do  not rule out bacterial infection or co-infection with other  viruses. If result is PRESUMPTIVE POSTIVE SARS-CoV-2 nucleic acids MAY BE PRESENT.   A presumptive positive result was obtained on the submitted specimen  and confirmed on repeat testing.  While 2019 novel coronavirus  (SARS-CoV-2) nucleic acids may be present in the submitted sample  additional confirmatory testing may be necessary for epidemiological  and / or clinical management purposes  to differentiate between  SARS-CoV-2 and other Sarbecovirus currently known to infect humans.  If clinically indicated additional testing with an alternate test  methodology [phone] is advised. The SARS-CoV-2 RNA is generally  detectable in upper and lower respiratory sp ecimens during the acute  phase of infection. The expected result is Negative. Fact Sheet for Patients:  BoilerBrush.com.cyhttps://www.fda.gov/media/136312/download Fact Sheet for Healthcare Providers: https://pope.com/https://www.fda.gov/media/136313/download This test is not yet approved or cleared by the [place] FDA and has been authorized for detection and/or diagnosis of SARS-CoV-2 by FDA under an Emergency Use Authorization (EUA).  This EUA will remain in effect (meaning this test can be used) for the duration of the COVID-19 declaration under Section 564(b)(1) of the Act, 21 U.S.C. section 360bbb-3(b)(1), unless the authorization is terminated or revoked sooner. Performed at [facility], [address] [place], [place] [address]   Comprehensive metabolic panel     Status: Abnormal   Collection Time: 03/10/19  9:38 AM  Result Value Ref Range   Sodium 133 (L) 135 - 145 mmol/L   Potassium 3.3 (L) 3.5 - 5.1 mmol/L   Chloride 98 98 - 111 mmol/L   CO2 22 22 - 32 mmol/L   Glucose, Bld 124 (H) 70 - 99 mg/dL   BUN 7 6 - 20 mg/dL   Creatinine, Ser 8.110.62 0.44 - 1.00 mg/dL   Calcium 9.1 8.9 - 91.410.3 mg/dL   Total Protein 7.9 6.5 - 8.1 g/dL   Albumin 3.9 3.5 - 5.0 g/dL   AST 20 15 - 41 U/L   ALT 21 0 - 44 U/L   Alkaline Phosphatase 90 38 - 126 U/L    Total Bilirubin 0.7 0.3 - 1.2 mg/dL   GFR calc non Af Amer >60 >60 mL/min   GFR calc Af Amer >60 >60 mL/min   Anion gap 13 5 - 15    Comment: Performed at [facility], [address] [place], [place] [address]  Lipase, blood     Status: None   Collection Time: 03/10/19  9:38 AM  Result Value Ref Range   Lipase 16 11 - 51 U/L    Comment: Performed at [facility], [address] [place], [place] [address]  CBC with Differential     Status: Abnormal   Collection Time: 03/10/19  9:38 AM  Result Value Ref Range   WBC 15.9 (H) 4.0 - 10.5 K/uL   RBC 4.86 3.87 - 5.11 MIL/uL   Hemoglobin 13.9 12.0 - 15.0 g/dL   HCT 86.542.1 78.436.0 - 69.646.0 %   MCV 86.6 80.0 - 100.0 fL   MCH 28.6 26.0 - 34.0 pg   MCHC 33.0 30.0 - 36.0  g/dL   RDW 16.112.3 09.611.5 - 04.515.5 %   Platelets 436 (H) 150 - 400 K/uL   nRBC 0.0 0.0 - 0.2 %   Neutrophils Relative % 86 %   Neutro Abs 13.7 (H) 1.7 - 7.7 K/uL   Lymphocytes Relative 9 %   Lymphs Abs 1.4 0.7 - 4.0 K/uL   Monocytes Relative 5 %   Monocytes Absolute 0.8 0.1 - 1.0 K/uL   Eosinophils Relative 0 %   Eosinophils Absolute 0.0 0.0 - 0.5 K/uL   Basophils Relative 0 %   Basophils Absolute 0.0 0.0 - 0.1 K/uL   Immature Granulocytes 0 %   Abs Immature Granulocytes 0.05 0.00 - 0.07 K/uL    Comment: Performed at [facility], [address] [place], [place] [address]  Phosphorus     Status: None   Collection Time: 03/10/19  5:00 PM  Result Value Ref Range   Phosphorus 2.8 2.5 - 4.6 mg/dL    Comment: Performed at [facility], [address] [place], [place] [address]  Magnesium     Status: None   Collection Time: 03/10/19  5:00 PM  Result Value Ref Range   Magnesium 1.9 1.7 - 2.4 mg/dL    Comment: Performed at [facility], [address] [place], [place] [address]  Surgical pcr screen     Status: Abnormal   Collection Time: 03/10/19  9:00 PM   Specimen: Nasal Mucosa; Nasal Swab  Result Value Ref Range   MRSA, PCR POSITIVE (A) NEGATIVE    Comment: RESULT CALLED TO, READ  BACK BY AND VERIFIED WITH: THOMAS,K @ 2328 ON 03/10/19 BY JUW    Staphylococcus aureus POSITIVE (A) NEGATIVE    Comment: (NOTE) The Xpert SA Assay (FDA approved for NASAL specimens in patients 34 years of age and older), is one component of a comprehensive surveillance program. It is not intended to diagnose infection nor to guide or monitor treatment. Performed at [facility], [address] [place], [place] [address]   CBC     Status: Abnormal   Collection Time: 03/11/19  6:13 AM  Result Value Ref Range   WBC 27.2 (H) 4.0 - 10.5 K/uL   RBC 4.77 3.87 - 5.11 MIL/uL   Hemoglobin 13.6 12.0 - 15.0 g/dL   HCT 65.741.4 84.636.0 - 96.246.0 %   MCV 86.8 80.0 - 100.0 fL   MCH 28.5 26.0 - 34.0 pg   MCHC 32.9 30.0 - 36.0 g/dL   RDW 95.212.8 84.111.5 - 32.415.5 %   Platelets 385 150 - 400 K/uL   nRBC 0.0 0.0 - 0.2 %    Comment: Performed at [facility], [address] [place], [place] [address]  Basic metabolic panel     Status: Abnormal   Collection Time: 03/11/19  6:13 AM  Result Value Ref Range   Sodium 133 (L) 135 - 145 mmol/L   Potassium 3.5 3.5 - 5.1 mmol/L   Chloride 100 98 - 111 mmol/L   CO2 23 22 - 32 mmol/L   Glucose, Bld 132 (H) 70 - 99 mg/dL   BUN <5 (L) 6 - 20 mg/dL   Creatinine, Ser 7.250.51 0.44 - 1.00 mg/dL   Calcium 8.5 (L) 8.9 - 10.3 mg/dL   GFR calc non Af Amer >60 >60 mL/min   GFR calc Af Amer >60 >60 mL/min   Anion gap 10 5 - 15    Comment: Performed at [facility], [address] [place], [place] [address]    Dg Chest  2 View  Result Date: 03/09/2019 CLINICAL DATA:  34 year old female with history of shortness of breath and upper abdominal pain since Wednesday. EXAM: CHEST - 2 VIEW COMPARISON:  Chest x-ray 05/30/2018. FINDINGS: Lung volumes are normal. No consolidative airspace disease. No pleural effusions. No pneumothorax. No pulmonary nodule or mass noted. Pulmonary vasculature and the cardiomediastinal silhouette are within normal limits. IMPRESSION: No radiographic evidence of acute  cardiopulmonary disease. Electronically Signed   By: Vinnie Langton M.D.   On: 03/09/2019 10:43   US Abdomen Limited  Result Date: 03/09/2019 CLINICAL DATA:  Right upper quadrant pain EXAM: ULTRASOUND ABDOMEN LIMITED RIGHT UPPER QUADRANT COMPARISON:  None. FINDINGS: Gallbladder: 24 mm gallstone fixed in the gallbladder neck. No wall thickening or pericholecystic edema. Common bile duct: Diameter: 5 mm.  Where visualized, no filling defect. Liver: Echogenic liver compared to the kidney. No focal abnormality. Portal vein is patent on color Doppler imaging with normal direction of blood flow towards the liver. IMPRESSION: Gallstone fixed in the gallbladder neck. There is focal gallbladder tenderness that could reflect gallbladder obstruction but no inflammatory changes typical of acute cholecystitis. Hepatic steatosis. Electronically Signed   By: Monte Fantasia M.D.   On: 03/09/2019 10:25    ROS:  Pertinent items are noted in HPI.  Blood pressure 132/76, pulse (!) 102, temperature 99.8 F (37.7 C), temperature source Oral, resp. rate 16, height 5\' 6"  (1.676 m), weight 127 kg, last menstrual period 02/19/2019, SpO2 94 %. Physical Exam: Morbidly obese white female no acute distress Head is normocephalic, atraumatic Eyes without scleral icterus Lungs are clear to auscultation with equal breath sounds bilaterally Heart examination reveals a regular rate and rhythm without S3, S4, murmurs Abdomen is soft with tenderness in the right upper quadrant to palpation.  No hepatosplenomegaly could be appreciated due to her body habitus.  No rigidity is noted.  Labs and ultrasound report reviewed  Assessment/Plan: Impression: Acute cholecystitis, cholelithiasis, morbid obesity, history of asthma, MRSA positive Plan: Patient be taken to the operating room today for laparoscopic cholecystectomy.  The risks and benefits of the procedure including bleeding, infection, hepatobiliary injury, and the possibility  of an open procedure were fully explained to the patient, who gave informed consent.  Aviva Signs 03/11/2019, 8:43 AM

## 2019-03-11 NOTE — H&P (View-Only) (Signed)
Reason for Consult: Acute cholecystitis, cholelithiasis Referring Physician: Dr. Fredirick MaudlinMadera  Dorothy Flowers is an 33 y.o. female.  HPI: Patient is a 33 year old morbidly obese white female who presented back to the emergency room on 03/10/2019 with worsening right upper quadrant abdominal pain and nausea.  Patient was seen in the ER a day earlier and diagnosed with cholelithiasis and biliary colic.  She was discharged home and was supposed to see a general surgeon in the office later in the week.  She stated the pain got worse and she presented back to the emergency room.  She was admitted to the hospital for further evaluation and treatment.  She was noted to have an elevated leukocytosis at 15,000.  Her LFTs were within normal limits.  Ultrasound the gallbladder revealed a single stone in the neck of the gallbladder.  Past Medical History:  Diagnosis Date  . Asthma    as child  . MRSA (methicillin resistant Staphylococcus aureus)     Past Surgical History:  Procedure Laterality Date  . CESAREAN SECTION N/A 10/07/2016   Procedure: CESAREAN SECTION;  Surgeon: Hermina StaggersMichael L Ervin, MD;  [facility];  Service: Obstetrics;  Laterality: N/A;  . WISDOM TOOTH EXTRACTION      Family History  Problem Relation Age of Onset  . Hypertension Mother   . Stroke Mother   . Diabetes Maternal Aunt   . Diabetes Maternal Grandmother   . Heart disease Father   . Hypertension Father   . Stroke Father   . COPD Paternal Grandmother        smokes  . Cancer Paternal Grandfather   . Heart disease Paternal Grandfather   . Mental illness Paternal Uncle   . Cleft lip Son   . Cleft palate Son     Social History:  reports that she has quit smoking. Her smoking use included cigarettes. She smoked 0.00 packs per day. She has never used smokeless tobacco. She reports current alcohol use. She reports that she does not use drugs.  Allergies:  Allergies  Allergen Reactions  . Cephalosporins  Shortness Of Breath, Swelling and Other (See Comments)    Patient says she can take PCN and amoxicillin  . Keflex [Cephalexin] Shortness Of Breath and Rash  . Adhesive [Tape] Itching and Rash    Medications: I have reviewed the patient's current medications.  Results for orders placed or performed during the hospital encounter of 03/10/19 (from the past 48 hour(s))  SARS Coronavirus 2 (CEPHEID - Performed in [facility] lab), Hosp Order     Status: None   Collection Time: 03/10/19  9:20 AM   Specimen: Nasopharyngeal Swab  Result Value Ref Range   SARS Coronavirus 2 NEGATIVE NEGATIVE    Comment: (NOTE) If result is NEGATIVE SARS-CoV-2 target nucleic acids are NOT DETECTED. The SARS-CoV-2 RNA is generally detectable in upper and lower  respiratory specimens during the acute phase of infection. The lowest  concentration of SARS-CoV-2 viral copies this assay can detect is 250  copies / mL. A negative result does not preclude SARS-CoV-2 infection  and should not be used as the sole basis for treatment or other  patient management decisions.  A negative result may occur with  improper specimen collection / handling, submission of specimen other  than nasopharyngeal swab, presence of viral mutation(s) within the  areas targeted by this assay, and inadequate number of viral copies  (<250 copies / mL). A negative result must be combined with clinical  observations,  patient history, and epidemiological information. If result is POSITIVE SARS-CoV-2 target nucleic acids are DETECTED. The SARS-CoV-2 RNA is generally detectable in upper and lower  respiratory specimens dur ing the acute phase of infection.  Positive  results are indicative of active infection with SARS-CoV-2.  Clinical  correlation with patient history and other diagnostic information is  necessary to determine patient infection status.  Positive results do  not rule out bacterial infection or co-infection with other  viruses. If result is PRESUMPTIVE POSTIVE SARS-CoV-2 nucleic acids MAY BE PRESENT.   A presumptive positive result was obtained on the submitted specimen  and confirmed on repeat testing.  While 2019 novel coronavirus  (SARS-CoV-2) nucleic acids may be present in the submitted sample  additional confirmatory testing may be necessary for epidemiological  and / or clinical management purposes  to differentiate between  SARS-CoV-2 and other Sarbecovirus currently known to infect humans.  If clinically indicated additional testing with an alternate test  methodology [phone] is advised. The SARS-CoV-2 RNA is generally  detectable in upper and lower respiratory sp ecimens during the acute  phase of infection. The expected result is Negative. Fact Sheet for Patients:  BoilerBrush.com.cyhttps://www.fda.gov/media/136312/download Fact Sheet for Healthcare Providers: https://pope.com/https://www.fda.gov/media/136313/download This test is not yet approved or cleared by the [place] FDA and has been authorized for detection and/or diagnosis of SARS-CoV-2 by FDA under an Emergency Use Authorization (EUA).  This EUA will remain in effect (meaning this test can be used) for the duration of the COVID-19 declaration under Section 564(b)(1) of the Act, 21 U.S.C. section 360bbb-3(b)(1), unless the authorization is terminated or revoked sooner. Performed at [facility], [address] [place], [place] [address]   Comprehensive metabolic panel     Status: Abnormal   Collection Time: 03/10/19  9:38 AM  Result Value Ref Range   Sodium 133 (L) 135 - 145 mmol/L   Potassium 3.3 (L) 3.5 - 5.1 mmol/L   Chloride 98 98 - 111 mmol/L   CO2 22 22 - 32 mmol/L   Glucose, Bld 124 (H) 70 - 99 mg/dL   BUN 7 6 - 20 mg/dL   Creatinine, Ser 8.110.62 0.44 - 1.00 mg/dL   Calcium 9.1 8.9 - 91.410.3 mg/dL   Total Protein 7.9 6.5 - 8.1 g/dL   Albumin 3.9 3.5 - 5.0 g/dL   AST 20 15 - 41 U/L   ALT 21 0 - 44 U/L   Alkaline Phosphatase 90 38 - 126 U/L    Total Bilirubin 0.7 0.3 - 1.2 mg/dL   GFR calc non Af Amer >60 >60 mL/min   GFR calc Af Amer >60 >60 mL/min   Anion gap 13 5 - 15    Comment: Performed at [facility], [address] [place], [place] [address]  Lipase, blood     Status: None   Collection Time: 03/10/19  9:38 AM  Result Value Ref Range   Lipase 16 11 - 51 U/L    Comment: Performed at [facility], [address] [place], [place] [address]  CBC with Differential     Status: Abnormal   Collection Time: 03/10/19  9:38 AM  Result Value Ref Range   WBC 15.9 (H) 4.0 - 10.5 K/uL   RBC 4.86 3.87 - 5.11 MIL/uL   Hemoglobin 13.9 12.0 - 15.0 g/dL   HCT 86.542.1 78.436.0 - 69.646.0 %   MCV 86.6 80.0 - 100.0 fL   MCH 28.6 26.0 - 34.0 pg   MCHC 33.0 30.0 - 36.0  g/dL   RDW 12.3 11.5 - 15.5 %   Platelets 436 (H) 150 - 400 K/uL   nRBC 0.0 0.0 - 0.2 %   Neutrophils Relative % 86 %   Neutro Abs 13.7 (H) 1.7 - 7.7 K/uL   Lymphocytes Relative 9 %   Lymphs Abs 1.4 0.7 - 4.0 K/uL   Monocytes Relative 5 %   Monocytes Absolute 0.8 0.1 - 1.0 K/uL   Eosinophils Relative 0 %   Eosinophils Absolute 0.0 0.0 - 0.5 K/uL   Basophils Relative 0 %   Basophils Absolute 0.0 0.0 - 0.1 K/uL   Immature Granulocytes 0 %   Abs Immature Granulocytes 0.05 0.00 - 0.07 K/uL    Comment: Performed at [facility], [address] [place], [place] [address]  Phosphorus     Status: None   Collection Time: 03/10/19  5:00 PM  Result Value Ref Range   Phosphorus 2.8 2.5 - 4.6 mg/dL    Comment: Performed at [facility], [address] [place], [place] [address]  Magnesium     Status: None   Collection Time: 03/10/19  5:00 PM  Result Value Ref Range   Magnesium 1.9 1.7 - 2.4 mg/dL    Comment: Performed at [facility], [address] [place], [place] [address]  Surgical pcr screen     Status: Abnormal   Collection Time: 03/10/19  9:00 PM   Specimen: Nasal Mucosa; Nasal Swab  Result Value Ref Range   MRSA, PCR POSITIVE (A) NEGATIVE    Comment: RESULT CALLED TO, READ  BACK BY AND VERIFIED WITH: THOMAS,K @ 2328 ON 03/10/19 BY JUW    Staphylococcus aureus POSITIVE (A) NEGATIVE    Comment: (NOTE) The Xpert SA Assay (FDA approved for NASAL specimens in patients 22 years of age and older), is one component of a comprehensive surveillance program. It is not intended to diagnose infection nor to guide or monitor treatment. Performed at [facility], [address] [place], [place] [address]   CBC     Status: Abnormal   Collection Time: 03/11/19  6:13 AM  Result Value Ref Range   WBC 27.2 (H) 4.0 - 10.5 K/uL   RBC 4.77 3.87 - 5.11 MIL/uL   Hemoglobin 13.6 12.0 - 15.0 g/dL   HCT 41.4 36.0 - 46.0 %   MCV 86.8 80.0 - 100.0 fL   MCH 28.5 26.0 - 34.0 pg   MCHC 32.9 30.0 - 36.0 g/dL   RDW 12.8 11.5 - 15.5 %   Platelets 385 150 - 400 K/uL   nRBC 0.0 0.0 - 0.2 %    Comment: Performed at [facility], [address] [place], [place] [address]  Basic metabolic panel     Status: Abnormal   Collection Time: 03/11/19  6:13 AM  Result Value Ref Range   Sodium 133 (L) 135 - 145 mmol/L   Potassium 3.5 3.5 - 5.1 mmol/L   Chloride 100 98 - 111 mmol/L   CO2 23 22 - 32 mmol/L   Glucose, Bld 132 (H) 70 - 99 mg/dL   BUN <5 (L) 6 - 20 mg/dL   Creatinine, Ser 0.51 0.44 - 1.00 mg/dL   Calcium 8.5 (L) 8.9 - 10.3 mg/dL   GFR calc non Af Amer >60 >60 mL/min   GFR calc Af Amer >60 >60 mL/min   Anion gap 10 5 - 15    Comment: Performed at [facility], [address] [place],  [address]    Dg Chest   2 View  Result Date: 03/09/2019 CLINICAL DATA:  34 year old female with history of shortness of breath and upper abdominal pain since Wednesday. EXAM: CHEST - 2 VIEW COMPARISON:  Chest x-ray 05/30/2018. FINDINGS: Lung volumes are normal. No consolidative airspace disease. No pleural effusions. No pneumothorax. No pulmonary nodule or mass noted. Pulmonary vasculature and the cardiomediastinal silhouette are within normal limits. IMPRESSION: No radiographic evidence of acute  cardiopulmonary disease. Electronically Signed   By: Vinnie Langton M.D.   On: 03/09/2019 10:43   US Abdomen Limited  Result Date: 03/09/2019 CLINICAL DATA:  Right upper quadrant pain EXAM: ULTRASOUND ABDOMEN LIMITED RIGHT UPPER QUADRANT COMPARISON:  None. FINDINGS: Gallbladder: 24 mm gallstone fixed in the gallbladder neck. No wall thickening or pericholecystic edema. Common bile duct: Diameter: 5 mm.  Where visualized, no filling defect. Liver: Echogenic liver compared to the kidney. No focal abnormality. Portal vein is patent on color Doppler imaging with normal direction of blood flow towards the liver. IMPRESSION: Gallstone fixed in the gallbladder neck. There is focal gallbladder tenderness that could reflect gallbladder obstruction but no inflammatory changes typical of acute cholecystitis. Hepatic steatosis. Electronically Signed   By: Monte Fantasia M.D.   On: 03/09/2019 10:25    ROS:  Pertinent items are noted in HPI.  Blood pressure 132/76, pulse (!) 102, temperature 99.8 F (37.7 C), temperature source Oral, resp. rate 16, height 5\' 6"  (1.676 m), weight 127 kg, last menstrual period 02/19/2019, SpO2 94 %. Physical Exam: Morbidly obese white female no acute distress Head is normocephalic, atraumatic Eyes without scleral icterus Lungs are clear to auscultation with equal breath sounds bilaterally Heart examination reveals a regular rate and rhythm without S3, S4, murmurs Abdomen is soft with tenderness in the right upper quadrant to palpation.  No hepatosplenomegaly could be appreciated due to her body habitus.  No rigidity is noted.  Labs and ultrasound report reviewed  Assessment/Plan: Impression: Acute cholecystitis, cholelithiasis, morbid obesity, history of asthma, MRSA positive Plan: Patient be taken to the operating room today for laparoscopic cholecystectomy.  The risks and benefits of the procedure including bleeding, infection, hepatobiliary injury, and the possibility  of an open procedure were fully explained to the patient, who gave informed consent.  Aviva Signs 03/11/2019, 8:43 AM

## 2019-03-12 ENCOUNTER — Encounter (HOSPITAL_COMMUNITY): Payer: Self-pay | Admitting: General Surgery

## 2019-03-12 LAB — COMPREHENSIVE METABOLIC PANEL
ALT: 35 U/L (ref 0–44)
AST: 35 U/L (ref 15–41)
Albumin: 2.6 g/dL — ABNORMAL LOW (ref 3.5–5.0)
Alkaline Phosphatase: 71 U/L (ref 38–126)
Anion gap: 7 (ref 5–15)
BUN: 10 mg/dL (ref 6–20)
CO2: 25 mmol/L (ref 22–32)
Calcium: 8 mg/dL — ABNORMAL LOW (ref 8.9–10.3)
Chloride: 104 mmol/L (ref 98–111)
Creatinine, Ser: 0.76 mg/dL (ref 0.44–1.00)
GFR calc Af Amer: >60 mL/min (ref >60)
GFR calc non Af Amer: >60 mL/min (ref >60)
Glucose, Bld: 107 mg/dL — ABNORMAL HIGH (ref 70–99)
Potassium: 3.4 mmol/L — ABNORMAL LOW (ref 3.5–5.1)
Sodium: 136 mmol/L (ref 135–145)
Total Bilirubin: 0.8 mg/dL (ref 0.3–1.2)
Total Protein: 5.9 g/dL — ABNORMAL LOW (ref 6.5–8.1)

## 2019-03-12 LAB — CBC
HCT: 34.8 % — ABNORMAL LOW (ref 36.0–46.0)
Hemoglobin: 11.3 g/dL — ABNORMAL LOW (ref 12.0–15.0)
MCH: 29.1 pg (ref 26.0–34.0)
MCHC: 32.5 g/dL (ref 30.0–36.0)
MCV: 89.7 fL (ref 80.0–100.0)
Platelets: 296 10*3/uL (ref 150–400)
RBC: 3.88 MIL/uL (ref 3.87–5.11)
RDW: 13.1 % (ref 11.5–15.5)
WBC: 14.4 10*3/uL — ABNORMAL HIGH (ref 4.0–10.5)
nRBC: 0 % (ref 0.0–0.2)

## 2019-03-12 LAB — HIV ANTIBODY (ROUTINE TESTING W REFLEX): HIV Screen 4th Generation wRfx: NONREACTIVE

## 2019-03-12 MED ORDER — OXYCODONE-ACETAMINOPHEN 7.5-325 MG PO TABS: 1.0000 | ORAL_TABLET | Freq: Four times a day (QID) | ORAL | 0 refills | Status: AC | PRN

## 2019-03-12 NOTE — Anesthesia Postprocedure Evaluation (Signed)
Anesthesia Post Note  Patient: Dorothy Flowers  Procedure(s) Performed: LAPAROSCOPIC CHOLECYSTECTOMY (N/A Abdomen)  Patient location during evaluation: PACU Anesthesia Type: General Level of consciousness: awake and alert and oriented Pain management: pain level controlled Vital Signs Assessment: post-procedure vital signs reviewed and stable Respiratory status: spontaneous breathing Cardiovascular status: blood pressure returned to baseline and stable Postop Assessment: no apparent nausea or vomiting Anesthetic complications: no Comments: Late entry     Last Vitals:  Vitals:   03/12/19 0050 03/12/19 0451  BP: (!) 108/59 (!) 103/57  Pulse: 72 80  Resp:    Temp: 36.7 C 36.9 C  SpO2: 97% 97%    Last Pain:  Vitals:   03/12/19 0751  TempSrc:   PainSc: Asleep                 Rutha Melgoza

## 2019-03-12 NOTE — Progress Notes (Signed)
Nsg Discharge Note  Admit Date:  03/10/2019 Discharge date: 03/12/2019   Dorothy Flowers to be D/C'd Home per MD order.  AVS completed.  Patient able to verbalize understanding.  Discharge Medication: Allergies as of 03/12/2019      Reactions   Cephalosporins Shortness Of Breath, Swelling, Other (See Comments)   Patient says she can take PCN and amoxicillin   Keflex [cephalexin] Shortness Of Breath, Rash   Adhesive [tape] Itching, Rash      Medication List    STOP taking these medications   oxyCODONE-acetaminophen 5-325 MG tablet Commonly known as: Percocet Replaced by: oxyCODONE-acetaminophen 7.5-325 MG tablet     TAKE these medications   ibuprofen 200 MG tablet Commonly known as: ADVIL Take 400 mg by mouth every 6 (six) hours as needed.   ondansetron 4 MG disintegrating tablet Commonly known as: Zofran ODT Take 1 tablet (4 mg total) by mouth every 8 (eight) hours as needed for nausea or vomiting.   oxyCODONE-acetaminophen 7.5-325 MG tablet Commonly known as: Percocet Take 1 tablet by mouth every 6 (six) hours as needed. Replaces: oxyCODONE-acetaminophen 5-325 MG tablet       Discharge Assessment: Vitals:   03/12/19 0050 03/12/19 0451  BP: (!) 108/59 (!) 103/57  Pulse: 72 80  Resp:    Temp: 98.1 F (36.7 C) 98.4 F (36.9 C)  SpO2: 97% 97%   Skin clean, dry and intact without evidence of skin break down, no evidence of skin tears noted. IV catheter discontinued intact. Site without signs and symptoms of complications - no redness or edema noted at insertion site, patient denies c/o pain - only slight tenderness at site.  Dressing with slight pressure applied.  D/c Instructions-Education: Discharge instructions given to patient with verbalized understanding. D/c education completed with patient including follow up instructions, medication list, d/c activities limitations if indicated, with other d/c instructions as indicated by MD - patient able to verbalize  understanding, all questions fully answered. Patient instructed to return to ED, call 911, or call MD for any changes in condition.  Patient escorted via [place], and D/C home via private auto.  Sahirah Rudell C, RN 03/12/2019 9:07 AM

## 2019-03-12 NOTE — Discharge Summary (Signed)
Physician Discharge Summary  Patient ID: JOE TANNEY MRN: [id number] DOB/AGE: [birth year] 33 y.o.  Admit date: 03/10/2019 Discharge date: 03/12/2019  Admission Diagnoses: Acute cholecystitis, cholelithiasis  Discharge Diagnoses: Same Principal Problem:   Acute cholecystitis Active Problems:   Obesity   Asthma   Biliary colic   Cholelithiasis   Discharged Condition: good  Hospital Course: Patient is a 33 year old white female who presented to the emergency room with recurrent episodes of right upper quadrant abdominal pain, nausea, and vomiting.  She had known history of acute cholecystitis with cholelithiasis.  She underwent laparoscopic cholecystectomy on 03/11/2019.  She tolerated the procedure well.  Her postoperative course has been unremarkable.  Her diet was advanced without difficulty.  Patient is being discharged home on 03/12/2019 in good and improving condition.  Treatments: surgery: Laparoscopic cholecystectomy on 03/11/2019  Discharge Exam: Blood pressure (!) 103/57, pulse 80, temperature 98.4 F (36.9 C), temperature source Oral, resp. rate 20, height 5\' 6"  (1.676 m), weight 127 kg, last menstrual period 02/19/2019, SpO2 97 %. General appearance: alert, cooperative and no distress Resp: clear to auscultation bilaterally Cardio: regular rate and rhythm, S1, S2 normal, no murmur, click, rub or gallop GI: Soft, incisions healing well.  Disposition: Discharge disposition: 01-Home or Self Care       Discharge Instructions    Diet - low sodium heart healthy   Complete by: As directed    Increase activity slowly   Complete by: As directed      Allergies as of 03/12/2019      Reactions   Cephalosporins Shortness Of Breath, Swelling, Other (See Comments)   Patient says she can take PCN and amoxicillin   Keflex [cephalexin] Shortness Of Breath, Rash   Adhesive [tape] Itching, Rash      Medication List    STOP taking these medications    oxyCODONE-acetaminophen 5-325 MG tablet Commonly known as: Percocet Replaced by: oxyCODONE-acetaminophen 7.5-325 MG tablet     TAKE these medications   ibuprofen 200 MG tablet Commonly known as: ADVIL Take 400 mg by mouth every 6 (six) hours as needed.   ondansetron 4 MG disintegrating tablet Commonly known as: Zofran ODT Take 1 tablet (4 mg total) by mouth every 8 (eight) hours as needed for nausea or vomiting.   oxyCODONE-acetaminophen 7.5-325 MG tablet Commonly known as: Percocet Take 1 tablet by mouth every 6 (six) hours as needed. Replaces: oxyCODONE-acetaminophen 5-325 MG tablet      Follow-up Information    Aviva Signs, MD. Schedule an appointment as soon as possible for a visit on 03/26/2019.   Specialty: General Surgery Contact information: [address] [place] [address] [phone]           Signed: Aviva Signs 03/12/2019, 7:34 AM

## 2019-03-12 NOTE — Discharge Instructions (Signed)
Laparoscopic Cholecystectomy, Care After This sheet gives you information about how to care for yourself after your procedure. Your health care provider may also give you more specific instructions. If you have problems or questions, contact your health care provider. What can I expect after the procedure? After the procedure, it is common to have:  Pain at your incision sites. You will be given medicines to control this pain.  Mild nausea or vomiting.  Bloating and possible shoulder pain from the air-like gas that was used during the procedure. Follow these instructions at home: Incision care   Follow instructions from your health care provider about how to take care of your incisions. Make sure you: ? Wash your hands with soap and water before you change your bandage (dressing). If soap and water are not available, use hand sanitizer. ? Change your dressing as told by your health care provider. ? Leave stitches (sutures), skin glue, or adhesive strips in place. These skin closures may need to be in place for 2 weeks or longer. If adhesive strip edges start to loosen and curl up, you may trim the loose edges. Do not remove adhesive strips completely unless your health care provider tells you to do that.  Do not take baths, swim, or use a hot tub until your health care provider approves. Ask your health care provider if you can take showers. You may only be allowed to take sponge baths for bathing.  Check your incision area every day for signs of infection. Check for: ? More redness, swelling, or pain. ? More fluid or blood. ? Warmth. ? Pus or a bad smell. Activity  Do not drive or use heavy machinery while taking prescription pain medicine.  Do not lift anything that is heavier than 10 lb (4.5 kg) until your health care provider approves.  Do not play contact sports until your health care provider approves.  Do not drive for 24 hours if you were given a medicine to help you relax  (sedative).  Rest as needed. Do not return to work or school until your health care provider approves. General instructions  Take over-the-counter and prescription medicines only as told by your health care provider.  To prevent or treat constipation while you are taking prescription pain medicine, your health care provider may recommend that you: ? Drink enough fluid to keep your urine clear or pale yellow. ? Take over-the-counter or prescription medicines. ? Eat foods that are high in fiber, such as fresh fruits and vegetables, whole grains, and beans. ? Limit foods that are high in fat and processed sugars, such as fried and sweet foods. Contact a health care provider if:  You develop a rash.  You have more redness, swelling, or pain around your incisions.  You have more fluid or blood coming from your incisions.  Your incisions feel warm to the touch.  You have pus or a bad smell coming from your incisions.  You have a fever.  One or more of your incisions breaks open. Get help right away if:  You have trouble breathing.  You have chest pain.  You have increasing pain in your shoulders.  You faint or feel dizzy when you stand.  You have severe pain in your abdomen.  You have nausea or vomiting that lasts for more than one day.  You have leg pain. This information is not intended to replace advice given to you by your health care provider. Make sure you discuss any questions you have   with your health care provider. Document Released: 08/01/2005 Document Revised: 07/14/2017 Document Reviewed: 01/18/2016 Elsevier Patient Education  2020 Elsevier Inc.  

## 2019-03-26 ENCOUNTER — Ambulatory Visit (INDEPENDENT_AMBULATORY_CARE_PROVIDER_SITE_OTHER): Payer: Self-pay | Admitting: General Surgery

## 2019-03-26 ENCOUNTER — Other Ambulatory Visit: Payer: Self-pay

## 2019-03-26 ENCOUNTER — Encounter: Payer: Self-pay | Admitting: General Surgery

## 2019-03-26 VITALS — BP 117/68 | HR 91 | Temp 98.4°F | Resp 18 | Ht 65.0 in | Wt 282.0 lb

## 2019-03-26 DIAGNOSIS — Z09 Encounter for follow-up examination after completed treatment for conditions other than malignant neoplasm: Secondary | ICD-10-CM

## 2019-03-26 NOTE — Progress Notes (Signed)
Subjective:     Dorothy Flowers  Here for postoperative visit.  Doing well.  Preoperative symptoms have resolved.  She denies any fever or chills. Objective:    BP 117/68 (BP Location: Left Arm, Patient Position: Sitting, Cuff Size: Normal)   Pulse 91   Temp 98.4 F (36.9 C) (Tympanic)   Resp 18   Ht 5\' 5"  (1.651 m)   Wt 282 lb (127.9 kg)   SpO2 96%   BMI 46.93 kg/m   General:  alert, cooperative and no distress  Abdomen soft, incisions healing well.  Staples removed, Steri-Strips applied.  Mild erythema is more skin irritation.  No infection present. Final pathology consistent with diagnosis.     Assessment:    Doing well postoperatively.    Plan:   May resume normal activity.  Follow-up here as needed.

## 2020-05-19 ENCOUNTER — Emergency Department (HOSPITAL_COMMUNITY): Payer: Self-pay

## 2020-05-19 ENCOUNTER — Other Ambulatory Visit: Payer: Self-pay

## 2020-05-19 ENCOUNTER — Emergency Department (HOSPITAL_COMMUNITY)
Admission: EM | Admit: 2020-05-19 | Discharge: 2020-05-19 | Disposition: A | Discharge status: Home / Self Care | Payer: Self-pay | Attending: Emergency Medicine | Admitting: Emergency Medicine

## 2020-05-19 ENCOUNTER — Encounter (HOSPITAL_COMMUNITY): Payer: Self-pay | Admitting: Emergency Medicine

## 2020-05-19 DIAGNOSIS — J45909 Unspecified asthma, uncomplicated: Secondary | ICD-10-CM | POA: Insufficient documentation

## 2020-05-19 DIAGNOSIS — N200 Calculus of kidney: Secondary | ICD-10-CM

## 2020-05-19 DIAGNOSIS — N132 Hydronephrosis with renal and ureteral calculous obstruction: Secondary | ICD-10-CM | POA: Insufficient documentation

## 2020-05-19 DIAGNOSIS — Z87891 Personal history of nicotine dependence: Secondary | ICD-10-CM | POA: Insufficient documentation

## 2020-05-19 LAB — URINALYSIS, ROUTINE W REFLEX MICROSCOPIC
Bilirubin Urine: NEGATIVE
Glucose, UA: NEGATIVE mg/dL
Ketones, ur: 5 mg/dL — AB
Nitrite: NEGATIVE
Protein, ur: 30 mg/dL — AB
RBC / HPF: >50 RBC/hpf — ABNORMAL HIGH (ref 0–5)
Specific Gravity, Urine: 1.029 (ref 1.005–1.030)
pH: 5 (ref 5.0–8.0)

## 2020-05-19 LAB — CBC WITH DIFFERENTIAL/PLATELET
Abs Immature Granulocytes: 0.05 10*3/uL (ref 0.00–0.07)
Basophils Absolute: 0.1 10*3/uL (ref 0.0–0.1)
Basophils Relative: 1 %
Eosinophils Absolute: 0.2 10*3/uL (ref 0.0–0.5)
Eosinophils Relative: 2 %
HCT: 39.8 % (ref 36.0–46.0)
Hemoglobin: 13.6 g/dL (ref 12.0–15.0)
Immature Granulocytes: 1 %
Lymphocytes Relative: 18 %
Lymphs Abs: 1.7 10*3/uL (ref 0.7–4.0)
MCH: 30.2 pg (ref 26.0–34.0)
MCHC: 34.2 g/dL (ref 30.0–36.0)
MCV: 88.2 fL (ref 80.0–100.0)
Monocytes Absolute: 0.6 10*3/uL (ref 0.1–1.0)
Monocytes Relative: 6 %
Neutro Abs: 6.9 10*3/uL (ref 1.7–7.7)
Neutrophils Relative %: 72 %
Platelets: 379 10*3/uL (ref 150–400)
RBC: 4.51 MIL/uL (ref 3.87–5.11)
RDW: 12.8 % (ref 11.5–15.5)
WBC: 9.5 10*3/uL (ref 4.0–10.5)
nRBC: 0 % (ref 0.0–0.2)

## 2020-05-19 LAB — BASIC METABOLIC PANEL
Anion gap: 9 (ref 5–15)
BUN: 19 mg/dL (ref 6–20)
CO2: 22 mmol/L (ref 22–32)
Calcium: 8.6 mg/dL — ABNORMAL LOW (ref 8.9–10.3)
Chloride: 105 mmol/L (ref 98–111)
Creatinine, Ser: 0.85 mg/dL (ref 0.44–1.00)
GFR calc Af Amer: >60 mL/min (ref >60)
GFR calc non Af Amer: >60 mL/min (ref >60)
Glucose, Bld: 122 mg/dL — ABNORMAL HIGH (ref 70–99)
Potassium: 3.7 mmol/L (ref 3.5–5.1)
Sodium: 136 mmol/L (ref 135–145)

## 2020-05-19 LAB — I-STAT BETA HCG BLOOD, ED (MC, WL, AP ONLY): I-stat hCG, quantitative: <5 m[IU]/mL (ref <5)

## 2020-05-19 MED ORDER — ONDANSETRON HCL 4 MG/2ML IJ SOLN
4.0000 mg | Freq: Once | INTRAMUSCULAR | Status: AC
  Administered 2020-05-19: 4 mg via INTRAVENOUS
  Filled 2020-05-19: qty 2

## 2020-05-19 MED ORDER — OXYCODONE-ACETAMINOPHEN 5-325 MG PO TABS: 1.0000 | ORAL_TABLET | Freq: Four times a day (QID) | ORAL | 0 refills | Status: DC | PRN

## 2020-05-19 MED ORDER — MORPHINE SULFATE (PF) 4 MG/ML IV SOLN
4.0000 mg | Freq: Once | INTRAVENOUS | Status: AC
  Administered 2020-05-19: 4 mg via INTRAVENOUS
  Filled 2020-05-19: qty 1

## 2020-05-19 MED ORDER — OXYCODONE-ACETAMINOPHEN 5-325 MG PO TABS
1.0000 | ORAL_TABLET | Freq: Once | ORAL | Status: AC
  Administered 2020-05-19: 1 via ORAL
  Filled 2020-05-19: qty 1

## 2020-05-19 MED ORDER — TAMSULOSIN HCL 0.4 MG PO CAPS
0.4000 mg | ORAL_CAPSULE | Freq: Every day | ORAL | 0 refills | Status: DC
Start: 2020-05-19 — End: 2022-11-23

## 2020-05-19 MED ORDER — KETOROLAC TROMETHAMINE 30 MG/ML IJ SOLN
30.0000 mg | Freq: Once | INTRAMUSCULAR | Status: AC
  Administered 2020-05-19: 30 mg via INTRAVENOUS
  Filled 2020-05-19: qty 1

## 2020-05-19 MED ORDER — SODIUM CHLORIDE 0.9 % IV BOLUS
1000.0000 mL | Freq: Once | INTRAVENOUS | Status: AC
  Administered 2020-05-19: 1000 mL via INTRAVENOUS

## 2020-05-19 MED ORDER — ONDANSETRON 4 MG PO TBDP: 4.0000 mg | ORAL_TABLET | Freq: Three times a day (TID) | ORAL | 0 refills | Status: DC | PRN

## 2020-05-19 NOTE — ED Provider Notes (Signed)
[facility] EMERGENCY DEPARTMENT Provider Note   CSN: [id number] Arrival date & time: 05/19/20  0539     History Chief Complaint  Patient presents with  . Flank Pain    Dorothy Flowers is a 34 y.o. female.  HPI     This is a 34 year old female with history of obesity and asthma who presents with right-sided flank pain.  Patient reports onset of symptoms last night.  She has had constant right-sided flank abdominal pain that radiates into her right lower quadrant.  Nothing seems to make it better or worse.  She rates her pain a 10 out of 10.  She took ibuprofen with no relief.  She also reports some burning discomfort with urination.  No hematuria or fevers.  She has had multiple episodes of nonbilious, nonbloody emesis.  Denies any upper respiratory symptoms, Covid exposures.  Past Medical History:  Diagnosis Date  . Asthma    as child  . MRSA (methicillin resistant Staphylococcus aureus)     Patient Active Problem List   Diagnosis Date Noted  . Biliary colic 03/10/2019  . Cholelithiasis 03/10/2019  . Acute cholecystitis 03/10/2019  . Family history of cleft lip and palate 11/24/2016  . History of low transverse cesarean section 11/24/2016  . Susceptible to varicella (non-immune), currently pregnant 03/16/2016  . Obesity 10/12/2006  . Asthma 10/12/2006  . ACNE 10/12/2006    Past Surgical History:  Procedure Laterality Date  . CESAREAN SECTION N/A 10/07/2016   Procedure: CESAREAN SECTION;  Surgeon: [name], MD;  Location: [facility] BIRTHING SUITES;  Service: Obstetrics;  Laterality: N/A;  . CHOLECYSTECTOMY N/A 03/11/2019   Procedure: LAPAROSCOPIC CHOLECYSTECTOMY;  Surgeon: Franky Macho, MD;  Location: [facility] ORS;  Service: General;  Laterality: N/A;  . WISDOM TOOTH EXTRACTION       OB History    Gravida  2   Para  2   Term  2   Preterm      AB      Living  2     SAB      TAB      Ectopic      Multiple  0   Live Births  2           Family  History  Problem Relation Age of Onset  . Hypertension Mother   . Stroke Mother   . Diabetes Maternal Aunt   . Diabetes Maternal Grandmother   . Heart disease Father   . Hypertension Father   . Stroke Father   . COPD Paternal Grandmother        smokes  . Cancer Paternal Grandfather   . Heart disease Paternal Grandfather   . Mental illness Paternal Uncle   . Cleft lip Son   . Cleft palate Son     Social History   Tobacco Use  . Smoking status: Former Smoker    Packs/day: 0.00    Types: Cigarettes  . Smokeless tobacco: Never Used  Vaping Use  . Vaping Use: Never used  Substance Use Topics  . Alcohol use: Yes    Comment: occas  . Drug use: No    Home Medications Prior to Admission medications   Medication Sig Start Date End Date Taking? Authorizing Provider  ibuprofen (ADVIL) 200 MG tablet Take 400 mg by mouth every 6 (six) hours as needed.    [provider]  ondansetron (ZOFRAN ODT) 4 MG disintegrating tablet Take 1 tablet (4 mg total) by mouth every 8 (eight)  hours as needed for nausea or vomiting. 05/19/20   Ariel Wingrove, Mayer Maskerourtney F, MD  oxyCODONE-acetaminophen (PERCOCET/ROXICET) 5-325 MG tablet Take 1 tablet by mouth every 6 (six) hours as needed for severe pain. 05/19/20   Peytyn Trine, Mayer Maskerourtney F, MD  tamsulosin (FLOMAX) 0.4 MG CAPS capsule Take 1 capsule (0.4 mg total) by mouth daily. 05/19/20   Querida Beretta, Mayer Maskerourtney F, MD    Allergies    Cephalosporins, Keflex [cephalexin], and Adhesive [tape]  Review of Systems   Review of Systems  Constitutional: Negative for fever.  Respiratory: Negative for shortness of breath.   Cardiovascular: Negative for chest pain.  Gastrointestinal: Positive for abdominal pain, nausea and vomiting. Negative for diarrhea.  Genitourinary: Positive for dysuria and flank pain. Negative for difficulty urinating.  Musculoskeletal: Negative for back pain.  All other systems reviewed and are negative.   Physical Exam Updated Vital Signs BP (!)  146/90 (BP Location: Right Arm)   Pulse (!) 58   Temp 97.6 F (36.4 C) (Oral)   Resp 16   LMP 04/19/2020   SpO2 99%   Physical Exam Vitals and nursing note reviewed.  Constitutional:      Appearance: She is well-developed.     Comments: Morbidly obese, no acute distress  HENT:     Head: Normocephalic and atraumatic.     Mouth/Throat:     Mouth: Mucous membranes are moist.  Eyes:     Pupils: Pupils are equal, round, and reactive to light.  Cardiovascular:     Rate and Rhythm: Normal rate and regular rhythm.     Heart sounds: Normal heart sounds.  Pulmonary:     Effort: Pulmonary effort is normal. No respiratory distress.     Breath sounds: No wheezing.  Abdominal:     General: Bowel sounds are normal.     Palpations: Abdomen is soft.     Tenderness: There is no abdominal tenderness. There is no right CVA tenderness or left CVA tenderness.  Musculoskeletal:     Cervical back: Neck supple.     Right lower leg: No edema.     Left lower leg: No edema.  Skin:    General: Skin is warm and dry.  Neurological:     Mental Status: She is alert and oriented to person, place, and time.  Psychiatric:        Mood and Affect: Mood normal.     ED Results / Procedures / Treatments   Labs (all labs ordered are listed, but only abnormal results are displayed) Labs Reviewed  BASIC METABOLIC PANEL - Abnormal; Notable for the following components:      Result Value   Glucose, Bld 122 (*)    Calcium 8.6 (*)    All other components within normal limits  CBC WITH DIFFERENTIAL/PLATELET  URINALYSIS, ROUTINE W REFLEX MICROSCOPIC  I-STAT BETA HCG BLOOD, ED ([facility], [facility], [facility] ONLY)    EKG None  Radiology CT Renal Stone Study  Result Date: 05/19/2020 CLINICAL DATA:  Right-sided flank pain starting yesterday. Vomiting. EXAM: CT ABDOMEN AND PELVIS WITHOUT CONTRAST TECHNIQUE: Multidetector CT imaging of the abdomen and pelvis was performed following the standard protocol without IV contrast.  COMPARISON:  None. FINDINGS: Lower chest: Unremarkable Hepatobiliary: Liver measures 24.7 cm craniocaudal length. Diffuse low attenuation of liver parenchyma is compatible with fatty deposition. Gallbladder is surgically absent. No intrahepatic or extrahepatic biliary dilation. Pancreas: No focal mass lesion. No dilatation of the main duct. No intraparenchymal cyst. No peripancreatic edema. Spleen: No splenomegaly. No focal mass  lesion. Adrenals/Urinary Tract: No adrenal nodule or mass. Left kidney and ureter are unremarkable. No stones are seen in the right kidney although mild right hydroureteronephrosis evident with distal periureteric edema/stranding. The secondary changes are due to the presence of a 5 x 5 x 6 mm stone in the distal right ureter at the UVJ. Bladder is decompressed without evidence of intraluminal stone. Stomach/Bowel: Stomach is unremarkable. No gastric wall thickening. No evidence of outlet obstruction. Duodenum is normally positioned as is the ligament of Treitz. No small bowel wall thickening. No small bowel dilatation. The terminal ileum is normal. The appendix is normal. No gross colonic mass. No colonic wall thickening. Vascular/Lymphatic: No abdominal aortic aneurysm. No abdominal aortic atherosclerotic calcification. There is no gastrohepatic or hepatoduodenal ligament lymphadenopathy. No retroperitoneal or mesenteric lymphadenopathy. No pelvic sidewall lymphadenopathy. Upper normal lymph nodes are seen in the groin regions bilaterally. Reproductive: The uterus is unremarkable.  There is no adnexal mass. Other: No intraperitoneal free fluid. Musculoskeletal: No worrisome lytic or sclerotic osseous abnormality. IMPRESSION: 1. 5 x 5 x 6 mm distal right ureteral stone at the UVJ causes mild right hydroureteronephrosis. No other urinary stone disease evident. 2. Hepatomegaly with hepatic steatosis. Electronically Signed   By: Kennith Center M.D.   On: 05/19/2020 06:53     Procedures Procedures (including critical care time)  Medications Ordered in ED Medications  sodium chloride 0.9 % bolus 1,000 mL (1,000 mLs Intravenous New Bag/Given 05/19/20 0618)  morphine 4 MG/ML injection 4 mg (4 mg Intravenous Given 05/19/20 0619)  ondansetron (ZOFRAN) injection 4 mg (4 mg Intravenous Given 05/19/20 0618)  ketorolac (TORADOL) 30 MG/ML injection 30 mg (30 mg Intravenous Given 05/19/20 0713)  oxyCODONE-acetaminophen (PERCOCET/ROXICET) 5-325 MG per tablet 1 tablet (1 tablet Oral Given 05/19/20 3903)    ED Course  I have reviewed the triage vital signs and the nursing notes.  Pertinent labs & imaging results that were available during my care of the patient were reviewed by me and considered in my medical decision making (see chart for details).  Clinical Course as of May 20 719  Tue May 19, 2020  0719 Patient noted to have a kidney stone in the right UVJ.  She is advised of this diagnosis.  She continues to have some pain.  Creatinine is normal.  Patient was given Toradol and Percocet.  She is providing a urine sample.  Assuming she can be pain managed, she will be managed expectantly.  Patient signed out to oncoming provider.   [CH]    Clinical Course User Index [CH] Serra Younan, Mayer Masker, MD   MDM Rules/Calculators/A&P                           Patient presents with right-sided flank and lower abdominal pain.  She is overall nontoxic and vital signs are reassuring.  History is highly suggestive of kidney stones.  She has no reproducible pain on exam.  There are no overlying skin changes to suggest shingles.  Doubt appendicitis.  She is status post cholecystectomy.  Labs obtained patient given pain and nausea medication.  See clinical course above.  CT scan shows an obstructive distal right ureteral stone.  Final Clinical Impression(s) / ED Diagnoses Final diagnoses:  Kidney stone    Rx / DC Orders ED Discharge Orders         Ordered     oxyCODONE-acetaminophen (PERCOCET/ROXICET) 5-325 MG tablet  Every 6 hours PRN  05/19/20 0719    ondansetron (ZOFRAN ODT) 4 MG disintegrating tablet  Every 8 hours PRN        05/19/20 0719    tamsulosin (FLOMAX) 0.4 MG CAPS capsule  Daily        05/19/20 0719           Shon Baton, MD 05/19/20 [phone]

## 2020-05-19 NOTE — Discharge Instructions (Addendum)
As discussed, your evaluation today has been largely reassuring beyond the diagnosis of a kidney stone.  But, it is important that you monitor your condition carefully, and do not hesitate to return to the ED if you develop new, or concerning changes in your condition.  Otherwise, please follow-up with your physician for appropriate ongoing care.

## 2020-05-19 NOTE — ED Provider Notes (Signed)
8:52 AM Pain negligible.  We discussed discharge, outpatient follow-up, analgesics, patient discharged stable condition.   Gerhard Munch, MD 05/19/20 [phone]

## 2020-05-19 NOTE — ED Triage Notes (Signed)
Pt reports right sided flank pain that started yesterday. Denies urinary symptoms. Reports emesis X 1.

## 2020-06-16 IMAGING — DX CHEST - 2 VIEW
2 series · 2 of 2 positions shown · non-contrast
Comparison: Chest x-ray 05/30/2018.

CLINICAL DATA: 33-year-old female with history of shortness of
breath and upper abdominal pain since [REDACTED].

EXAM:
CHEST - 2 VIEW

[chest pa]
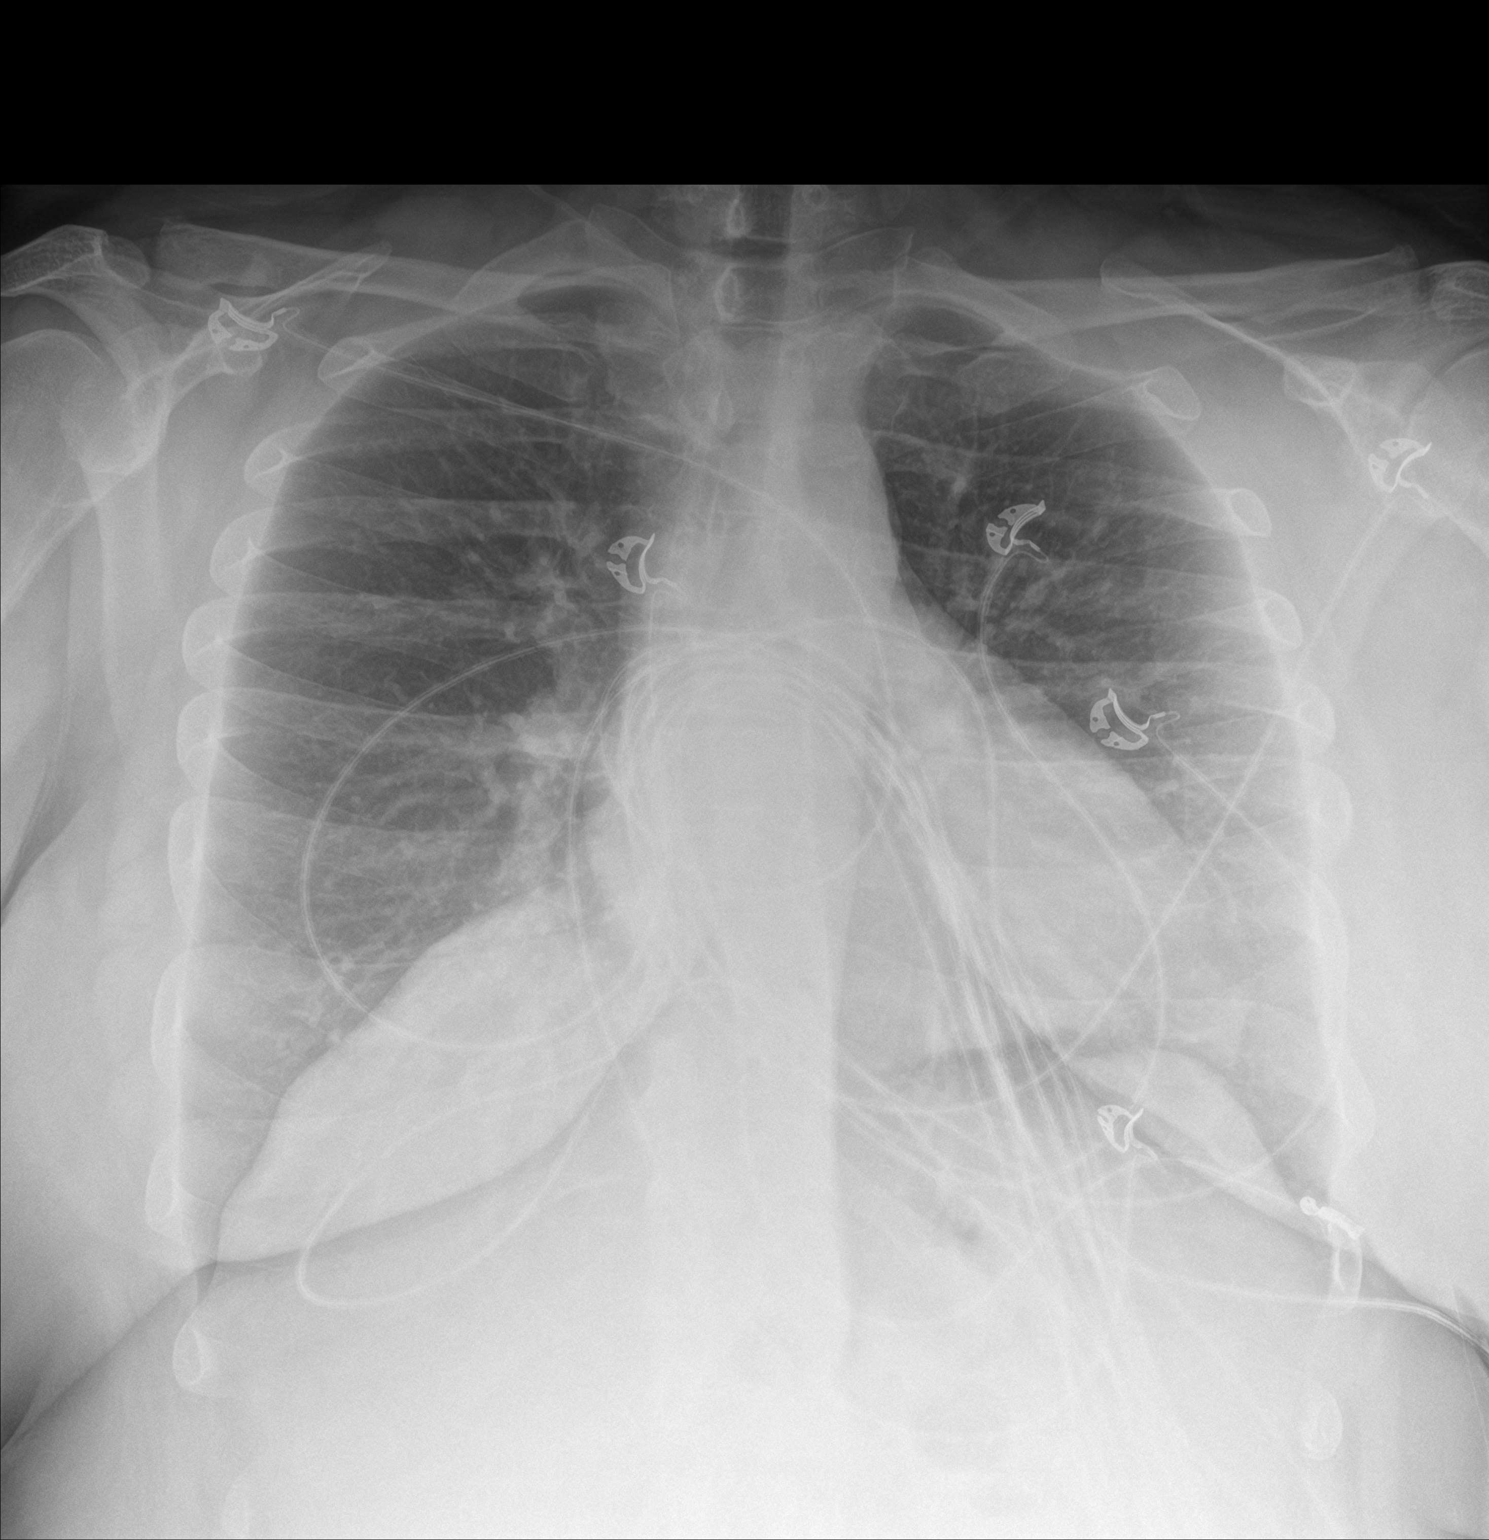

[chest lat]
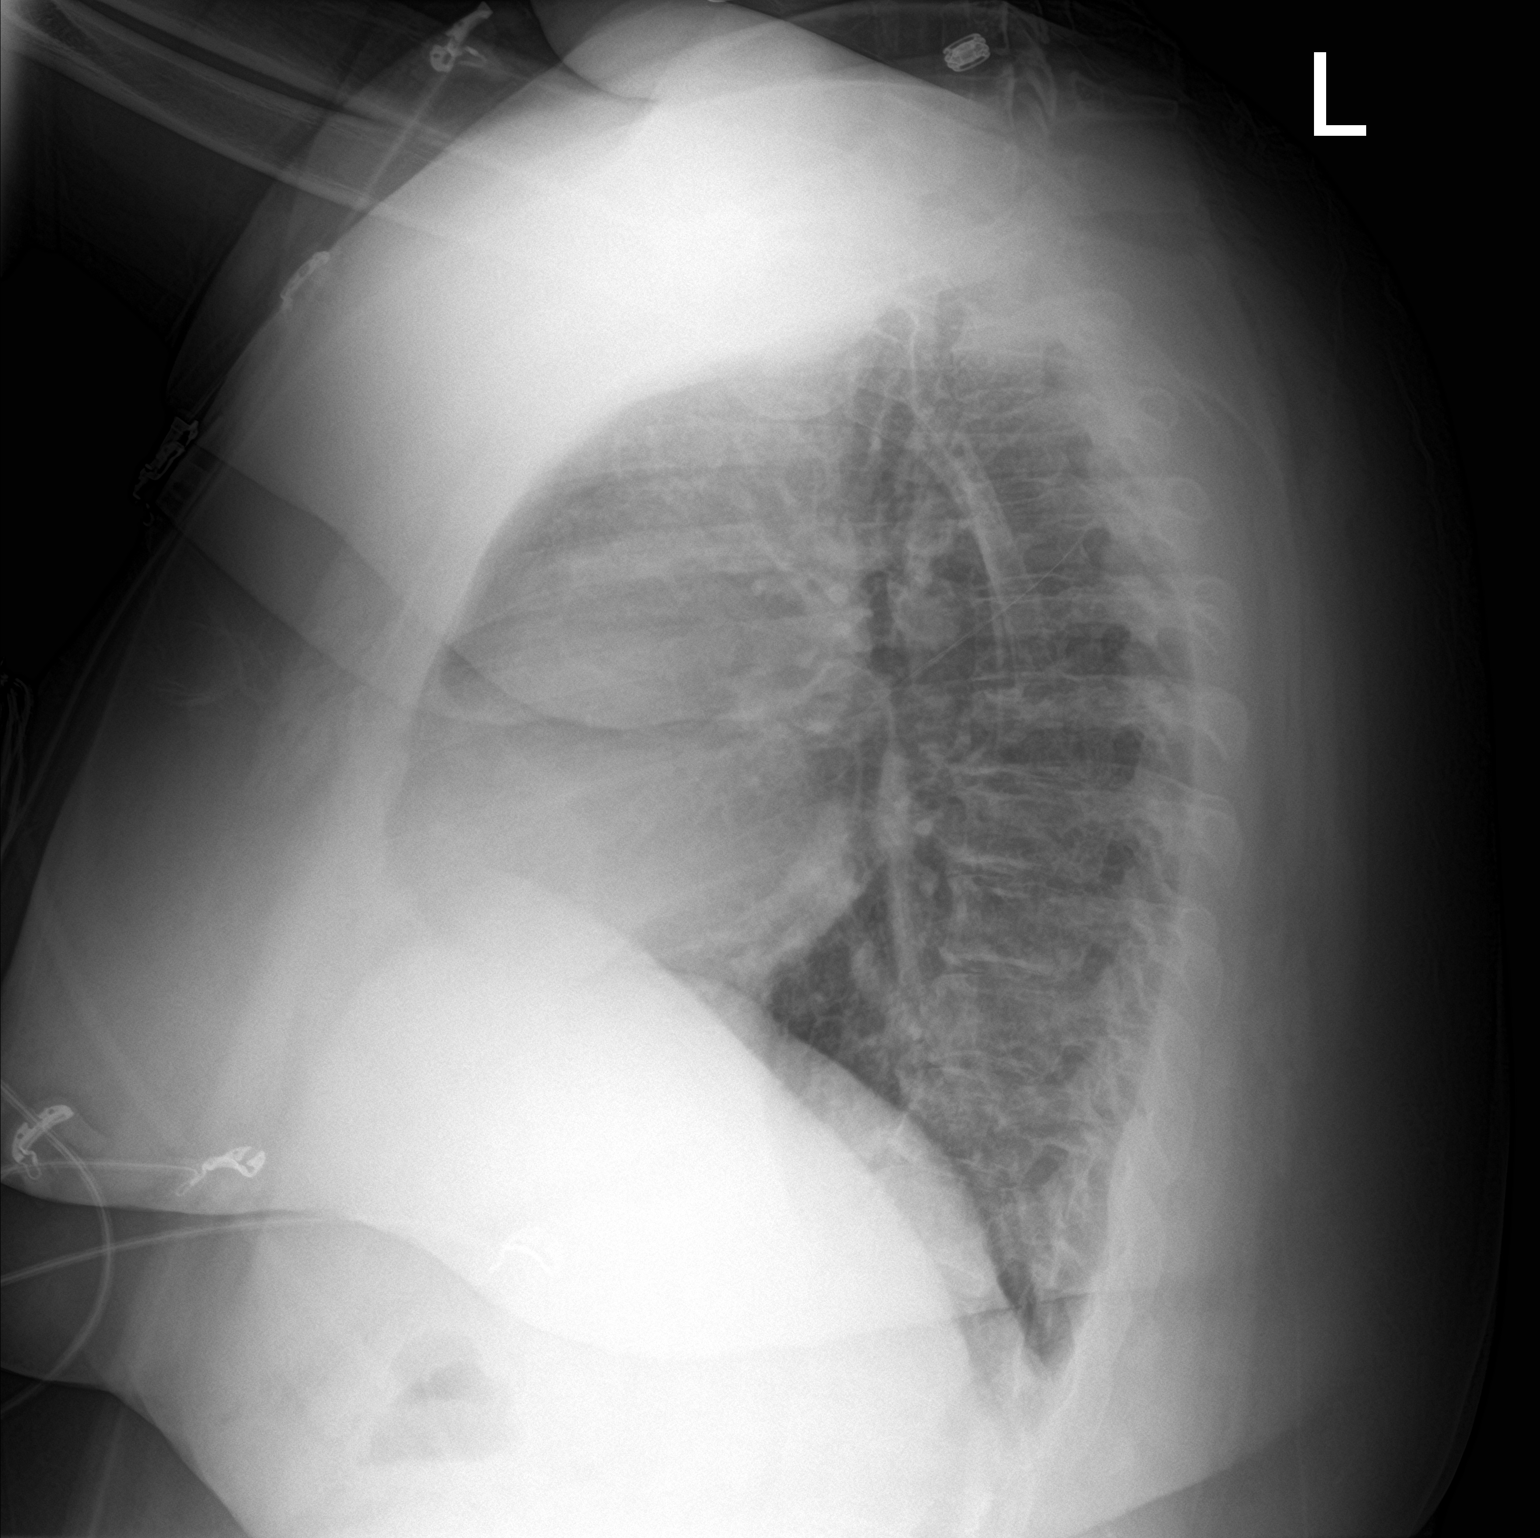

[2 of 2 positions shown; findings below may reference images not displayed]

FINDINGS: Lung volumes are normal. No consolidative airspace disease. No
pleural effusions. No pneumothorax. No pulmonary nodule or mass
noted. Pulmonary vasculature and the cardiomediastinal silhouette
are within normal limits.
IMPRESSION: No radiographic evidence of acute cardiopulmonary disease.

## 2021-08-27 IMAGING — CT CT RENAL STONE PROTOCOL
2 of 4 series · 16 of 46 positions shown, 18 images · non-contrast
Comparison: None.

CLINICAL DATA: Right-sided flank pain starting yesterday. Vomiting.

EXAM:
CT ABDOMEN AND PELVIS WITHOUT CONTRAST
TECHNIQUE: Multidetector CT imaging of the abdomen and pelvis was performed
following the standard protocol without IV contrast.

[Series 2: axial st · axial · 0.98mm/px · z∈[+1111,+1571]mm · 13 of 104 slices shown, 15 images]
[im 6/104  soft-tissue]
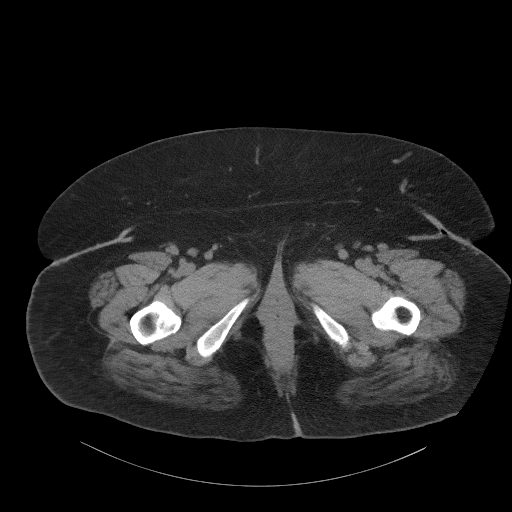
[im 6/104  bone]
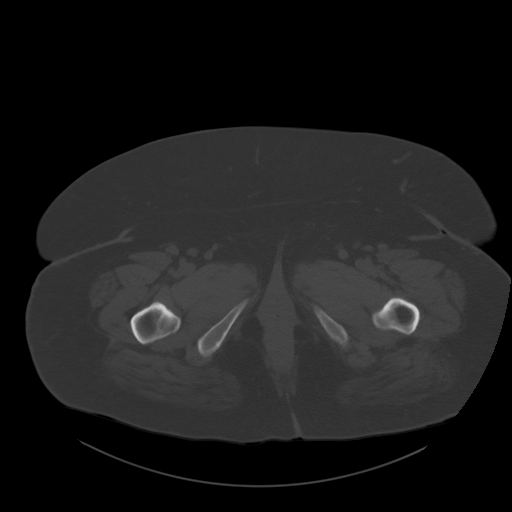
[im 12/104  soft-tissue]
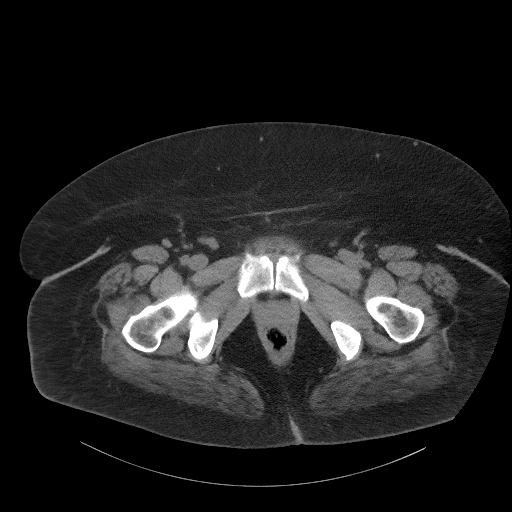
[im 23/104  soft-tissue]
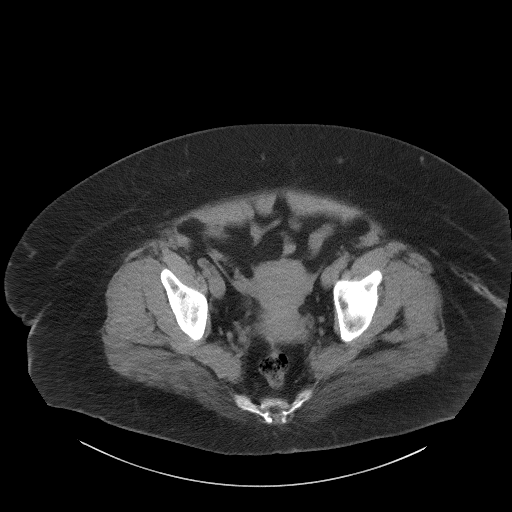
[im 29/104  soft-tissue]
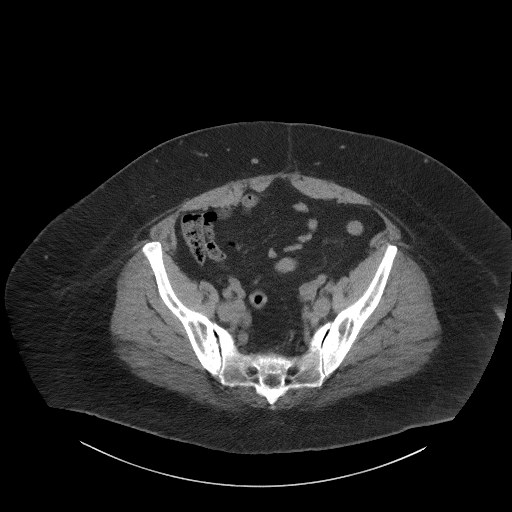
[im 35/104  soft-tissue]
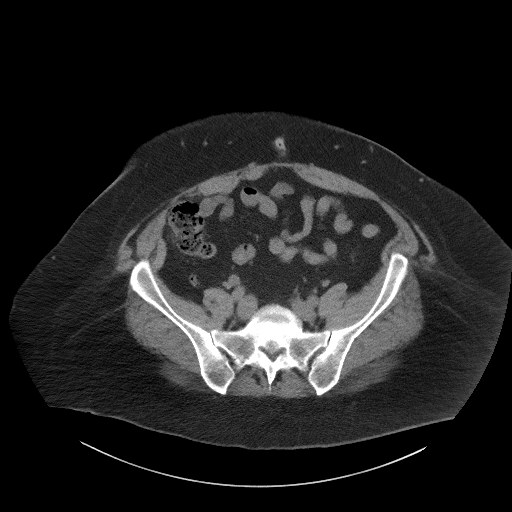
[im 46/104  soft-tissue]
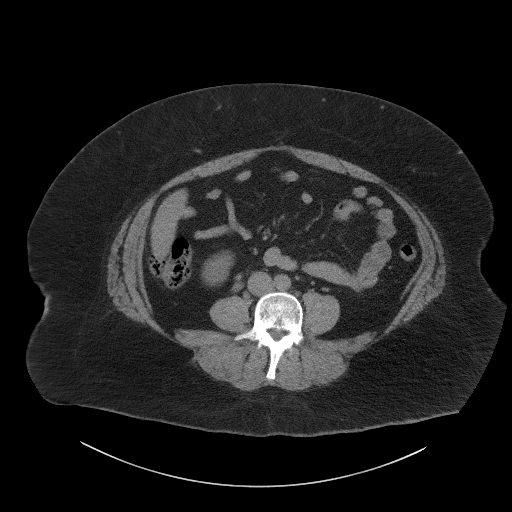
[im 52/104  soft-tissue]
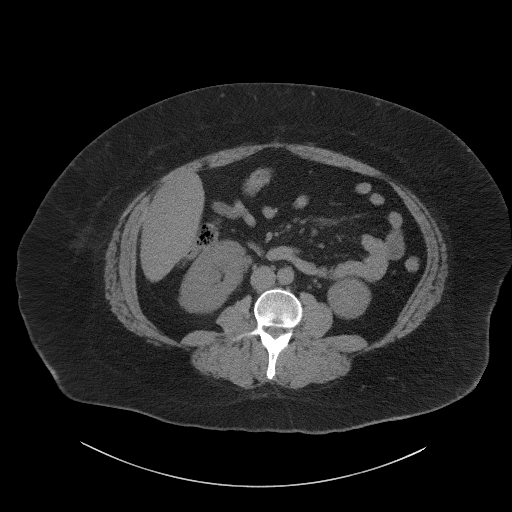
[im 58/104  soft-tissue]
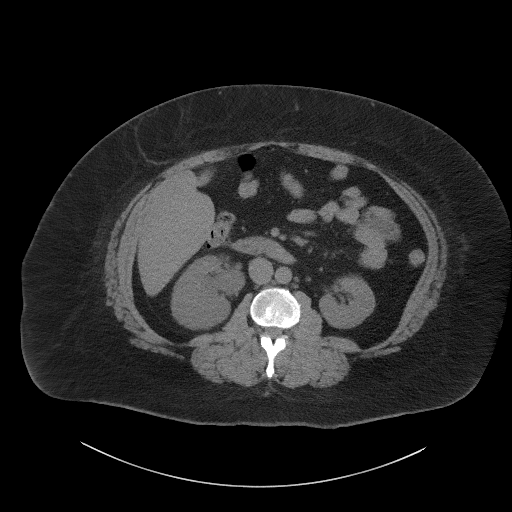
[im 69/104  soft-tissue]
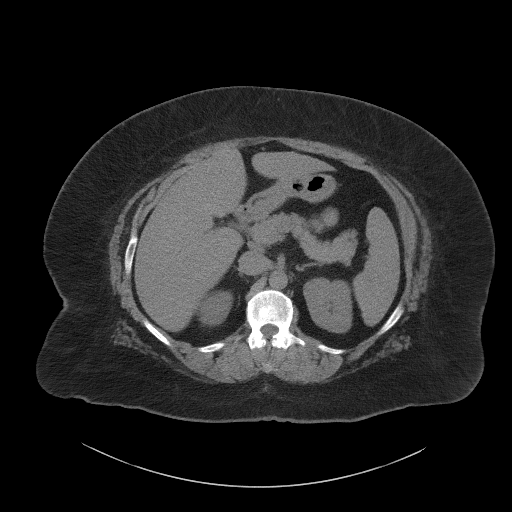
[im 69/104  bone]
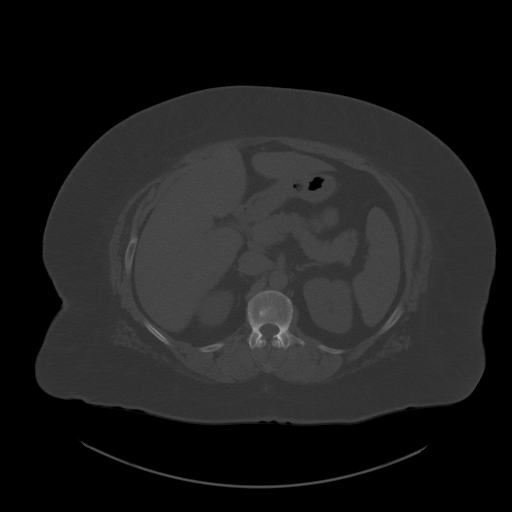
[im 75/104  soft-tissue]
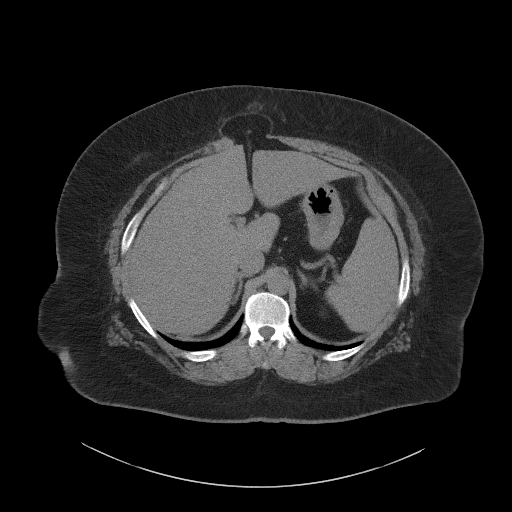
[im 81/104  soft-tissue]
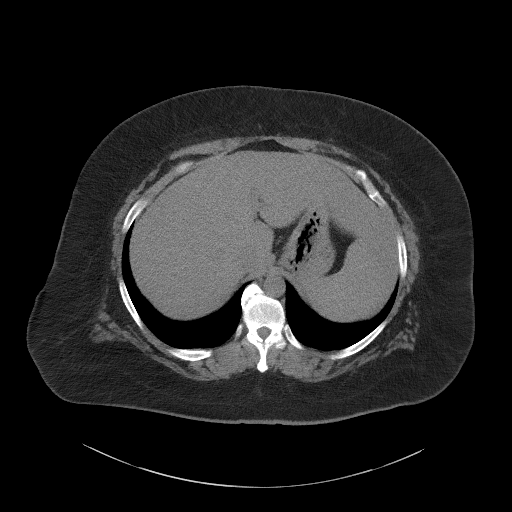
[im 92/104  soft-tissue]
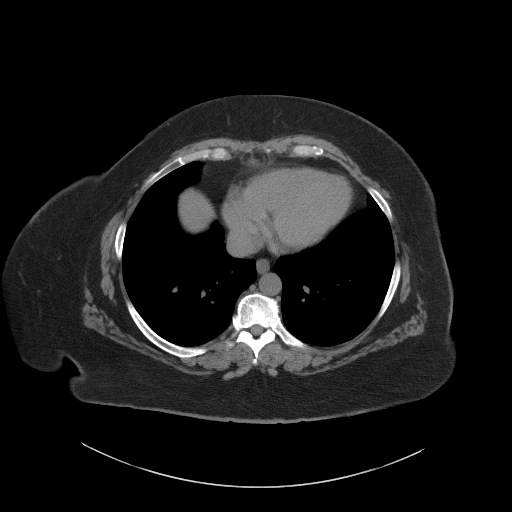
[im 98/104  soft-tissue]
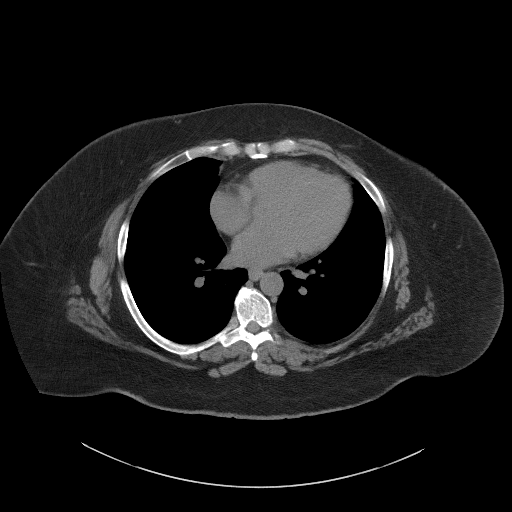

[Series 5: coronal st · coronal · 0.87mm/px · 3 of 106 slices shown]
[im 36/106  soft-tissue]
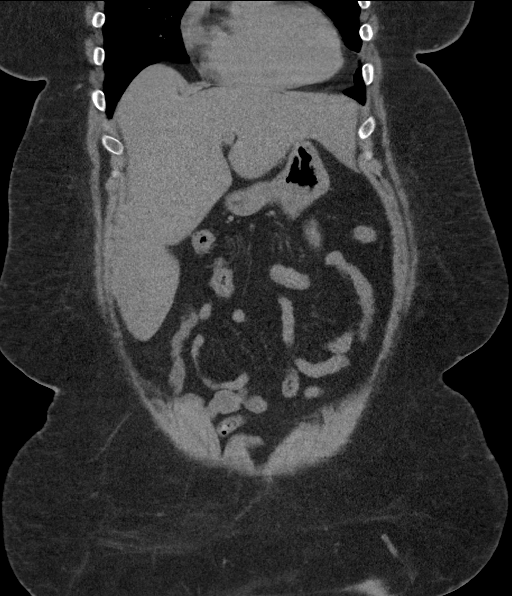
[im 47/106  soft-tissue]
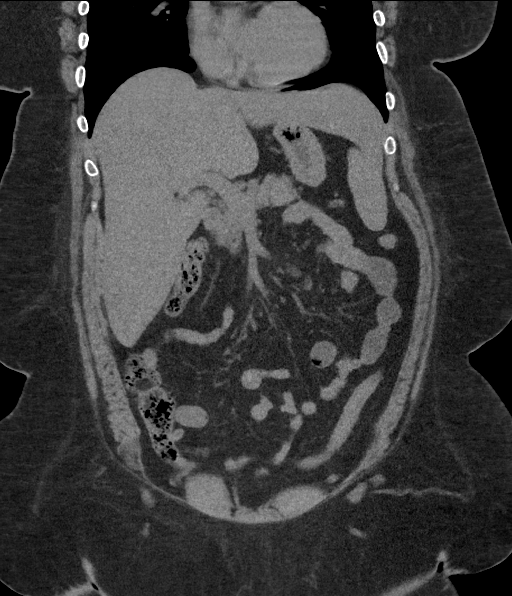
[im 59/106  soft-tissue]
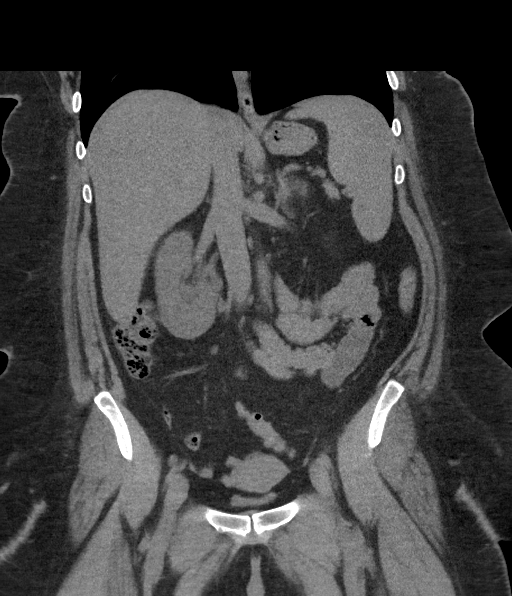

[16 of 46 positions shown; findings below may reference images not displayed]

FINDINGS: Lower chest: Unremarkable

Hepatobiliary: Liver measures 24.7 cm craniocaudal length. Diffuse
low attenuation of liver parenchyma is compatible with fatty
deposition. Gallbladder is surgically absent. No intrahepatic or
extrahepatic biliary dilation.

Pancreas: No focal mass lesion. No dilatation of the main duct. No
intraparenchymal cyst. No peripancreatic edema.

Spleen: No splenomegaly. No focal mass lesion.

Adrenals/Urinary Tract: No adrenal nodule or mass. Left kidney and
ureter are unremarkable.

No stones are seen in the right kidney although mild right
hydroureteronephrosis evident with distal periureteric
edema/stranding. The secondary changes are due to the presence of a
5 x 5 x 6 mm stone in the distal right ureter at the UVJ.

Bladder is decompressed without evidence of intraluminal stone.

Stomach/Bowel: Stomach is unremarkable. No gastric wall thickening.
No evidence of outlet obstruction. Duodenum is normally positioned
as is the ligament of Treitz. No small bowel wall thickening. No
small bowel dilatation. The terminal ileum is normal. The appendix
is normal. No gross colonic mass. No colonic wall thickening.

Vascular/Lymphatic: No abdominal aortic aneurysm. No abdominal
aortic atherosclerotic calcification. There is no gastrohepatic or
hepatoduodenal ligament lymphadenopathy. No retroperitoneal or
mesenteric lymphadenopathy. No pelvic sidewall lymphadenopathy.
Upper normal lymph nodes are seen in the groin regions bilaterally.

Reproductive: The uterus is unremarkable.  There is no adnexal mass.

Other: No intraperitoneal free fluid.

Musculoskeletal: No worrisome lytic or sclerotic osseous
abnormality.
IMPRESSION: 1. 5 x 5 x 6 mm distal right ureteral stone at the UVJ causes mild
right hydroureteronephrosis. No other urinary stone disease evident.
2. Hepatomegaly with hepatic steatosis.

## 2022-11-17 ENCOUNTER — Other Ambulatory Visit: Payer: Self-pay

## 2022-11-17 ENCOUNTER — Encounter (HOSPITAL_COMMUNITY): Payer: Self-pay | Admitting: Emergency Medicine

## 2022-11-17 ENCOUNTER — Emergency Department (HOSPITAL_COMMUNITY)
Admission: EM | Admit: 2022-11-17 | Discharge: 2022-11-17 | Disposition: A | Discharge status: Home / Self Care | Payer: Medicaid Other | Attending: Emergency Medicine | Admitting: Emergency Medicine

## 2022-11-17 DIAGNOSIS — D649 Anemia, unspecified: Secondary | ICD-10-CM | POA: Insufficient documentation

## 2022-11-17 DIAGNOSIS — K625 Hemorrhage of anus and rectum: Secondary | ICD-10-CM | POA: Insufficient documentation

## 2022-11-17 DIAGNOSIS — J45909 Unspecified asthma, uncomplicated: Secondary | ICD-10-CM | POA: Insufficient documentation

## 2022-11-17 LAB — COMPREHENSIVE METABOLIC PANEL
ALT: 20 U/L (ref 0–44)
AST: 17 U/L (ref 15–41)
Albumin: 3 g/dL — ABNORMAL LOW (ref 3.5–5.0)
Alkaline Phosphatase: 90 U/L (ref 38–126)
Anion gap: 4 — ABNORMAL LOW (ref 5–15)
BUN: 10 mg/dL (ref 6–20)
CO2: 24 mmol/L (ref 22–32)
Calcium: 7.8 mg/dL — ABNORMAL LOW (ref 8.9–10.3)
Chloride: 107 mmol/L (ref 98–111)
Creatinine, Ser: 0.61 mg/dL (ref 0.44–1.00)
GFR, Estimated: >60 mL/min (ref >60)
Glucose, Bld: 101 mg/dL — ABNORMAL HIGH (ref 70–99)
Potassium: 3.4 mmol/L — ABNORMAL LOW (ref 3.5–5.1)
Sodium: 135 mmol/L (ref 135–145)
Total Bilirubin: 0.3 mg/dL (ref 0.3–1.2)
Total Protein: 6.2 g/dL — ABNORMAL LOW (ref 6.5–8.1)

## 2022-11-17 LAB — CBC
HCT: 33.2 % — ABNORMAL LOW (ref 36.0–46.0)
Hemoglobin: 11.1 g/dL — ABNORMAL LOW (ref 12.0–15.0)
MCH: 30.2 pg (ref 26.0–34.0)
MCHC: 33.4 g/dL (ref 30.0–36.0)
MCV: 90.5 fL (ref 80.0–100.0)
Platelets: 318 10*3/uL (ref 150–400)
RBC: 3.67 MIL/uL — ABNORMAL LOW (ref 3.87–5.11)
RDW: 12.5 % (ref 11.5–15.5)
WBC: 8.4 10*3/uL (ref 4.0–10.5)
nRBC: 0 % (ref 0.0–0.2)

## 2022-11-17 LAB — TYPE AND SCREEN
ABO/RH(D): O POS
Antibody Screen: NEGATIVE

## 2022-11-17 MED ORDER — HYDROCORTISONE ACETATE 25 MG RE SUPP: 25.0000 mg | Freq: Two times a day (BID) | RECTAL | 0 refills | Status: AC

## 2022-11-17 NOTE — ED Triage Notes (Signed)
Pt c/o rectal bleeding since Sunday. Pt states it is bright red blood.

## 2022-11-17 NOTE — ED Provider Notes (Signed)
[facility] Provider Note   CSN: [id number] Arrival date & time: 11/17/22  1230     History  Chief Complaint  Patient presents with   Rectal Bleeding    Dorothy Flowers is a 37 y.o. female with history of asthma only, presenting for evaluation of rectal bleeding.  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  The symptoms lasted for 2 days, she was then symptom-free for 3 days and then developed episodes of rectal bleeding which started 5 days ago.  She endorses bright red blood with bowel movements, several times daily since.  She denies fevers or chills, has no abdominal pain, no rectal pain.  She does have an external hemorrhoid which has been present since the birth of her child, this is not painful currently.    The history is provided by the patient.       Home Medications Prior to Admission medications   Medication Sig Start Date End Date Taking? Authorizing Provider  hydrocortisone (ANUSOL-HC) 25 MG suppository Place 1 suppository (25 mg total) rectally 2 (two) times daily. 11/17/22  Yes Clotine Heiner, Almyra Free, PA-C  ibuprofen (ADVIL) 200 MG tablet Take 400 mg by mouth every 6 (six) hours as needed.    [provider]  ondansetron (ZOFRAN ODT) 4 MG disintegrating tablet Take 1 tablet (4 mg total) by mouth every 8 (eight) hours as needed for nausea or vomiting. 05/19/20   Horton, Barbette Hair, MD  oxyCODONE-acetaminophen (PERCOCET/ROXICET) 5-325 MG tablet Take 1 tablet by mouth every 6 (six) hours as needed for severe pain. 05/19/20   Horton, Barbette Hair, MD  tamsulosin (FLOMAX) 0.4 MG CAPS capsule Take 1 capsule (0.4 mg total) by mouth daily. 05/19/20   Horton, Barbette Hair, MD      Allergies    Cephalosporins, Keflex [cephalexin], and Adhesive [tape]    Review of Systems   Review of Systems  Constitutional:  Negative for fever.  HENT:  Negative for  congestion and sore throat.   Eyes: Negative.   Respiratory:  Negative for chest tightness and shortness of breath.   Cardiovascular:  Negative for chest pain.  Gastrointestinal:  Positive for anal bleeding. Negative for abdominal pain, nausea and vomiting.  Genitourinary: Negative.   Musculoskeletal:  Negative for arthralgias, joint swelling and neck pain.  Skin: Negative.  Negative for rash and wound.  Neurological:  Negative for dizziness, weakness, light-headedness, numbness and headaches.  Psychiatric/Behavioral: Negative.      Physical Exam Updated Vital Signs BP 128/78 (BP Location: Right Arm)   Pulse 65   Temp 98 F (36.7 C) (Oral)   Resp (!) 22   Ht 5\' 5"  (1.651 m)   Wt 128 kg   LMP 10/10/2022   SpO2 100%   BMI 46.96 kg/m  Physical Exam Vitals and nursing note reviewed.  Constitutional:      Appearance: She is well-developed.  HENT:     Head: Normocephalic and atraumatic.  Eyes:     Conjunctiva/sclera: Conjunctivae normal.  Cardiovascular:     Rate and Rhythm: Normal rate and regular rhythm.     Heart sounds: Normal heart sounds.  Pulmonary:     Effort: Pulmonary effort is normal.     Breath sounds: Normal breath sounds. No wheezing.  Abdominal:     General: Bowel sounds are normal.     Palpations: Abdomen is soft.  Tenderness: There is no abdominal tenderness.  Genitourinary:    Rectum: Guaiac result positive. No mass, tenderness or anal fissure.     Comments: There is a small external hemorrhoidal tag, it is flat nontender. ? Internal hemorrhoid, stool residue is not grossly bloody but is strongly Hemoccult positive. Musculoskeletal:        General: Normal range of motion.     Cervical back: Normal range of motion.  Skin:    General: Skin is warm and dry.  Neurological:     Mental Status: She is alert.     ED Results / Procedures / Treatments   Labs (all labs ordered are listed, but only abnormal results are displayed) Labs Reviewed   COMPREHENSIVE METABOLIC PANEL - Abnormal; Notable for the following components:      Result Value   Potassium 3.4 (*)    Glucose, Bld 101 (*)    Calcium 7.8 (*)    Total Protein 6.2 (*)    Albumin 3.0 (*)    Anion gap 4 (*)    All other components within normal limits  CBC - Abnormal; Notable for the following components:   RBC 3.67 (*)    Hemoglobin 11.1 (*)    HCT 33.2 (*)    All other components within normal limits  POC OCCULT BLOOD, ED  POC OCCULT BLOOD, ED  POC URINE PREG, ED  TYPE AND SCREEN    EKG None  Radiology No results found.  Procedures Procedures    Medications Ordered in ED Medications - No data to display  ED Course/ Medical Decision Making/ A&P                             Medical Decision Making Patient with a 5-day history of rectal bleeding with bowel movements, bright red, no abdominal pain, started several days after having a GI bug, with the diarrhea nausea and vomiting having resolved.  Afebrile, no abdominal pain.  No rectal pain on exam.  I suspect this may be internal hemorrhoidal bleeding.  Amount and/or Complexity of Data Reviewed Labs: ordered.    Details: Labs are significant for a normal BUN, suggesting source of blood is from the lower GI tract.  She has a normal WBC count at 8.4, her hemoglobin is 11.1, historically she does have a chronic mild anemia. Discussion of management or test interpretation with external provider(s): Patient was discussed with Dr. Marletta Lor who will plan to see her in his office next Wednesday, his office will call her for an appointment time for consideration of sigmoidoscopy.    In the interim patient was given strict return precautions for any worsening symptoms or new symptoms of weakness, dizziness, chest pain, shortness of breath.  Patient was prescribed Anusol suppositories.  Risk Prescription drug management.           Final Clinical Impression(s) / ED Diagnoses Final diagnoses:  Rectal  bleeding    Rx / DC Orders ED Discharge Orders          Ordered    hydrocortisone (ANUSOL-HC) 25 MG suppository  2 times daily        11/17/22 1834              Burgess Amor, PA-C 11/17/22 1843    Rondel Baton, MD 11/19/22 [phone]

## 2022-11-17 NOTE — ED Notes (Signed)
Dc instructions and scripts reviewed with pt will follow up next week. No questions or concerns at this time.

## 2022-11-17 NOTE — ED Provider Triage Note (Signed)
Emergency Medicine Provider Triage Evaluation Note  Dorothy Flowers , a 37 y.o. female  was evaluated in triage.  Pt complains of rectal bleeding with bm's x 5 days.  No abdominal pain, no dizziness, nv.  H/o hemorrhoids but never had bleeding with these.  Sound like "water faucet running" when she is passing blood.  Review of Systems  Positive: Rectal bleeding,  Negative: Fever, n/v, abd pain, dizziness  Physical Exam  BP 131/80 (BP Location: Right Arm)   Pulse 66   Temp 97.9 F (36.6 C) (Oral)   Resp 20   Ht 5\' 5"  (1.651 m)   Wt 128 kg   LMP 10/10/2022   SpO2 100%   BMI 46.96 kg/m  Gen:   Awake, no distress   Resp:  Normal effort  MSK:   Moves extremities without difficulty  Other:    Medical Decision Making  Medically screening exam initiated at 4:29 PM.  Appropriate orders placed.  Stephani Police Seedorf was informed that the remainder of the evaluation will be completed by another provider, this initial triage assessment does not replace that evaluation, and the importance of remaining in the ED until their evaluation is complete.     Evalee Jefferson, PA-C 11/17/22 1631

## 2022-11-17 NOTE — Discharge Instructions (Signed)
As discussed we suspect your symptoms are secondary to an internal hemorrhoid, these typically will heal on their own, however you should have a flexible sigmoidoscopy to make sure there is no other reasons for the source of your bleeding.  Dr. Ave Filter office should call you tomorrow for an appointment time next Wednesday.  In the interim I have prescribed you suppository to use twice daily which may help relieve your symptoms.  If you develop worse bleeding, weakness, lightheadedness, chest pain or shortness of breath in association with this bleeding, do not wait till next Wednesday, come back here for further evaluation.

## 2022-11-23 ENCOUNTER — Ambulatory Visit (INDEPENDENT_AMBULATORY_CARE_PROVIDER_SITE_OTHER): Payer: Self-pay | Admitting: Internal Medicine

## 2022-11-23 ENCOUNTER — Encounter: Payer: Self-pay | Admitting: Internal Medicine

## 2022-11-23 VITALS — BP 118/82 | HR 76 | Temp 98.3°F | Ht 65.0 in | Wt 290.0 lb

## 2022-11-23 DIAGNOSIS — D62 Acute posthemorrhagic anemia: Secondary | ICD-10-CM

## 2022-11-23 DIAGNOSIS — K625 Hemorrhage of anus and rectum: Secondary | ICD-10-CM

## 2022-11-23 NOTE — Progress Notes (Signed)
Primary Care Physician:  Patient, No Pcp Per Primary Gastroenterologist:  Dr. Marletta Lor  Chief Complaint  Patient presents with   Rectal Bleeding    Hospital follow up on rectal bleeding. Bleeding stopped this past Saturday.     HPI:   Dorothy Flowers is a 37 y.o. female who presents to clinic today for ER follow-up visit.  Seen at [facility], ER 11/17/2022 for rectal bleeding.  Patient notes 4 days of rectal bleeding leading up to this evaluation.  Notes bright red blood in the toilet.  No rectal pain or discomfort.  No abdominal pain.  No previous episodes of rectal bleeding prior.  No previous colonoscopy.  In the ER found to have hemoglobin of 11.1 with baseline around 13.  Treated conservatively, discharged home on Anusol.  Today, states her bleeding continued for a few days after emergency room visit.  Has see since that time.  Denies any fatigue or generalized weakness.  No dizziness.  Denies any upper GI symptoms including heartburn, reflux, dysphagia/odynophagia, epigastric or chest pain.  Past Medical History:  Diagnosis Date   Asthma    as child   MRSA (methicillin resistant Staphylococcus aureus)     Past Surgical History:  Procedure Laterality Date   CESAREAN SECTION N/A 10/07/2016   Procedure: CESAREAN SECTION;  Surgeon: [name], MD;  Location: [facility] BIRTHING SU[facility];  Service: Obstetrics;  Laterality: N/A;   CHOLECYSTECTOMY N/A 03/11/2019   Procedure: LAPAROSCOPIC CHOLECYSTECTOMY;  Surgeon: Franky Macho, MD;  Location: [facility] ORS;  Service: General;  Laterality: N/A;   WISDOM TOOTH EXTRACTION      Current Outpatient Medications  Medication Sig Dispense Refill   hydrocortisone (ANUSOL-HC) 25 MG suppository Place 1 suppository (25 mg total) rectally 2 (two) times daily. 12 suppository 0   ibuprofen (ADVIL) 200 MG tablet Take 400 mg by mouth every 6 (six) hours as needed.     No current facility-administered medications for this visit.    Allergies as of  11/23/2022 - Review Complete 11/23/2022  Allergen Reaction Noted   Cephalosporins Shortness Of Breath, Swelling, and Other (See Comments) 05/11/2014   Keflex [cephalexin] Shortness Of Breath and Rash 01/01/2015   Adhesive [tape] Itching and Rash 10/17/2016    Family History  Problem Relation Age of Onset   Hypertension Mother    Stroke Mother    Diabetes Maternal Aunt    Diabetes Maternal Grandmother    Heart disease Father    Hypertension Father    Stroke Father    COPD Paternal Grandmother        smokes   Cancer Paternal Grandfather    Heart disease Paternal Grandfather    Mental illness Paternal Uncle    Cleft lip Son    Cleft palate Son     Social History   Socioeconomic History   Marital status: Single    Spouse name: Not on file   Number of children: Not on file   Years of education: Not on file   Highest education level: Not on file  Occupational History   Not on file  Tobacco Use   Smoking status: Every Day    Types: E-cigarettes    Passive exposure: Current   Smokeless tobacco: Never  Vaping Use   Vaping Use: Every day  Substance and Sexual Activity   Alcohol use: Yes    Comment: occas   Drug use: No   Sexual activity: Not Currently    Birth control/protection: None  Other Topics Concern  Not on file  Social History Narrative   Not on file   Social Determinants of Health   Financial Resource Strain: Not on file  Food Insecurity: Not on file  Transportation Needs: Not on file  Physical Activity: Not on file  Stress: Not on file  Social Connections: Not on file  Intimate Partner Violence: Not on file    Subjective: Review of Systems  Constitutional:  Negative for chills and fever.  HENT:  Negative for congestion and hearing loss.   Eyes:  Negative for blurred vision and double vision.  Respiratory:  Negative for cough and shortness of breath.   Cardiovascular:  Negative for chest pain and palpitations.  Gastrointestinal:  Positive for  blood in stool. Negative for abdominal pain, constipation, diarrhea, heartburn, melena and vomiting.  Genitourinary:  Negative for dysuria and urgency.  Musculoskeletal:  Negative for joint pain and myalgias.  Skin:  Negative for itching and rash.  Neurological:  Negative for dizziness and headaches.  Psychiatric/Behavioral:  Negative for depression. The patient is not nervous/anxious.        Objective: BP 118/82 (BP Location: Left Arm, Patient Position: Sitting, Cuff Size: Large)   Pulse 76   Temp 98.3 F (36.8 C) (Oral)   Ht 5\' 5"  (1.651 m)   Wt 290 lb (131.5 kg)   LMP 10/10/2022   BMI 48.26 kg/m  Physical Exam Constitutional:      Appearance: Normal appearance.  HENT:     Head: Normocephalic and atraumatic.  Eyes:     Extraocular Movements: Extraocular movements intact.     Conjunctiva/sclera: Conjunctivae normal.  Cardiovascular:     Rate and Rhythm: Normal rate and regular rhythm.  Pulmonary:     Effort: Pulmonary effort is normal.     Breath sounds: Normal breath sounds.  Abdominal:     General: Bowel sounds are normal.     Palpations: Abdomen is soft.  Musculoskeletal:        General: No swelling. Normal range of motion.     Cervical back: Normal range of motion and neck supple.  Skin:    General: Skin is warm and dry.     Coloration: Skin is not jaundiced.  Neurological:     General: No focal deficit present.     Mental Status: She is alert and oriented to person, place, and time.  Psychiatric:        Mood and Affect: Mood normal.        Behavior: Behavior normal.      Assessment: *Rectal bleeding *Acute blood loss anemia  Plan: Discussed rectal bleeding in depth with patient today.  Likely benign anorectal source such as diverticular bleed or hemorrhoidal.  That being said, will schedule for colonoscopy to further evaluate.  The risks including infection, bleed, or perforation as well as benefits, limitations, alternatives and imponderables have  been reviewed with the patient. Questions have been answered. All parties agreeable.  Check CBC today.  We will call with results.  Further recommendations to follow.   11/23/2022 4:32 PM   Disclaimer: This note was dictated with voice recognition software. Similar sounding words can inadvertently be transcribed and may not be corrected upon review.

## 2022-11-23 NOTE — Patient Instructions (Addendum)
We will schedule you for colonoscopy to further evaluate your rectal bleeding.  I will check CBC today at [facility] to check your blood counts.  We will call with results.  It was very nice meeting you today.  Dr. Marletta Lor

## 2022-11-24 ENCOUNTER — Telehealth (INDEPENDENT_AMBULATORY_CARE_PROVIDER_SITE_OTHER): Payer: Self-pay | Admitting: Internal Medicine

## 2022-11-24 LAB — CBC
Hematocrit: 35.6 % (ref 34.0–46.6)
Hemoglobin: 11.7 g/dL (ref 11.1–15.9)
MCH: 29.5 pg (ref 26.6–33.0)
MCHC: 32.9 g/dL (ref 31.5–35.7)
MCV: 90 fL (ref 79–97)
Platelets: 427 10*3/uL (ref 150–450)
RBC: 3.97 x10E6/uL (ref 3.77–5.28)
RDW: 12.5 % (ref 11.7–15.4)
WBC: 9.9 10*3/uL (ref 3.4–10.8)

## 2022-11-24 MED ORDER — PEG 3350-KCL-NA BICARB-NACL 420 G PO SOLR: 4000.0000 mL | Freq: Once | ORAL | 0 refills | Status: AC

## 2022-11-24 NOTE — Telephone Encounter (Signed)
Pt contacted and scheduled colonoscopy. Prep sent to pharmacy. Instructions will be mailed to pt. Pt states at this time she does not have insurance. Will call with pre op appt

## 2022-12-08 NOTE — Telephone Encounter (Signed)
Pt contacted and made aware of pre op appt.  

## 2022-12-21 NOTE — Patient Instructions (Signed)
Dorothy Flowers  12/21/2022     @PREFPERIOPPHARMACY @   Your procedure is scheduled on  12/26/2022.   Report to [facility] at  0900 A.M.   Call this number if you have problems the morning of surgery:  [phone]  If you experience any cold or flu symptoms such as cough, fever, chills, shortness of breath, etc. between now and your scheduled surgery, please notify us at the above number.   Remember:  Follow the diet and prep instructions given to you by the office.    Take these medicines the morning of surgery with A SIP OF WATER                                            None    Do not wear jewelry, make-up or nail polish.  Do not wear lotions, powders, or perfumes, or deodorant.  Do not shave 48 hours prior to surgery.  Men may shave face and neck.  Do not bring valuables to the hospital.  [facility] is not responsible for any belongings or valuables.  Contacts, dentures or bridgework may not be worn into surgery.  Leave your suitcase in the car.  After surgery it may be brought to your room.  For patients admitted to the hospital, discharge time will be determined by your treatment team.  Patients discharged the day of surgery will not be allowed to drive home and must have someone with them for 24 hours.    Special instructions:   DO NOT smoke tobacco or vape for 24 hours before your procedure.  Please read over the following fact sheets that you were given. Anesthesia Post-op Instructions and Care and Recovery After Surgery      Colonoscopy, Adult, Care After The following information offers guidance on how to care for yourself after your procedure. Your health care provider may also give you more specific instructions. If you have problems or questions, contact your health care provider. What can I expect after the procedure? After the procedure, it is common to have: A small amount of blood in your stool for 24 hours after the procedure. Some  gas. Mild cramping or bloating of your abdomen. Follow these instructions at home: Eating and drinking  Drink enough fluid to keep your urine pale yellow. Follow instructions from your health care provider about eating or drinking restrictions. Resume your normal diet as told by your health care provider. Avoid heavy or fried foods that are hard to digest. Activity Rest as told by your health care provider. Avoid sitting for a long time without moving. Get up to take short walks every 1-2 hours. This is important to improve blood flow and breathing. Ask for help if you feel weak or unsteady. Return to your normal activities as told by your health care provider. Ask your health care provider what activities are safe for you. Managing cramping and bloating  Try walking around when you have cramps or feel bloated. If directed, apply heat to your abdomen as told by your health care provider. Use the heat source that your health care provider recommends, such as a moist heat pack or a heating pad. Place a towel between your skin and the heat source. Leave the heat on for 20-30 minutes. Remove the heat if your skin turns bright red. This is especially important  if you are unable to feel pain, heat, or cold. You have a greater risk of getting burned. General instructions If you were given a sedative during the procedure, it can affect you for several hours. Do not drive or operate machinery until your health care provider says that it is safe. For the first 24 hours after the procedure: Do not sign important documents. Do not drink alcohol. Do your regular daily activities at a slower pace than normal. Eat soft foods that are easy to digest. Take over-the-counter and prescription medicines only as told by your health care provider. Keep all follow-up visits. This is important. Contact a health care provider if: You have blood in your stool 2-3 days after the procedure. Get help right away  if: You have more than a small spotting of blood in your stool. You have large blood clots in your stool. You have swelling of your abdomen. You have nausea or vomiting. You have a fever. You have increasing pain in your abdomen that is not relieved with medicine. These symptoms may be an emergency. Get help right away. Call 911. Do not wait to see if the symptoms will go away. Do not drive yourself to the hospital. Summary After the procedure, it is common to have a small amount of blood in your stool. You may also have mild cramping and bloating of your abdomen. If you were given a sedative during the procedure, it can affect you for several hours. Do not drive or operate machinery until your health care provider says that it is safe. Get help right away if you have a lot of blood in your stool, nausea or vomiting, a fever, or increased pain in your abdomen. This information is not intended to replace advice given to you by your health care provider. Make sure you discuss any questions you have with your health care provider. Document Revised: 03/24/2021 Document Reviewed: 03/24/2021 Elsevier Patient Education  [place] After The following information offers guidance on how to care for yourself after your procedure. Your health care provider may also give you more specific instructions. If you have problems or questions, contact your health care provider. What can I expect after the procedure? After the procedure, it is common to have: Tiredness. Little or no memory about what happened during or after the procedure. Impaired judgment when it comes to making decisions. Nausea or vomiting. Some trouble with balance. Follow these instructions at home: For the time period you were told by your health care provider:  Rest. Do not participate in activities where you could fall or become injured. Do not drive or use machinery. Do not drink  alcohol. Do not take sleeping pills or medicines that cause drowsiness. Do not make important decisions or sign legal documents. Do not take care of children on your own. Medicines Take over-the-counter and prescription medicines only as told by your health care provider. If you were prescribed antibiotics, take them as told by your health care provider. Do not stop using the antibiotic even if you start to feel better. Eating and drinking Follow instructions from your health care provider about what you may eat and drink. Drink enough fluid to keep your urine pale yellow. If you vomit: Drink clear fluids slowly and in small amounts as you are able. Clear fluids include water, ice chips, low-calorie sports drinks, and fruit juice that has water added to it (diluted fruit juice). Eat light and bland foods in small  amounts as you are able. These foods include bananas, applesauce, rice, lean meats, toast, and crackers. General instructions  Have a responsible adult stay with you for the time you are told. It is important to have someone help care for you until you are awake and alert. If you have sleep apnea, surgery and some medicines can increase your risk for breathing problems. Follow instructions from your health care provider about wearing your sleep device: When you are sleeping. This includes during daytime naps. While taking prescription pain medicines, sleeping medicines, or medicines that make you drowsy. Do not use any products that contain nicotine or tobacco. These products include cigarettes, chewing tobacco, and vaping devices, such as e-cigarettes. If you need help quitting, ask your health care provider. Contact a health care provider if: You feel nauseous or vomit every time you eat or drink. You feel light-headed. You are still sleepy or having trouble with balance after 24 hours. You get a rash. You have a fever. You have redness or swelling around the IV site. Get help  right away if: You have trouble breathing. You have new confusion after you get home. These symptoms may be an emergency. Get help right away. Call 911. Do not wait to see if the symptoms will go away. Do not drive yourself to the hospital. This information is not intended to replace advice given to you by your health care provider. Make sure you discuss any questions you have with your health care provider. Document Revised: 12/27/2021 Document Reviewed: 12/27/2021 Elsevier Patient Education  [place].

## 2022-12-22 ENCOUNTER — Other Ambulatory Visit: Payer: Self-pay

## 2022-12-22 ENCOUNTER — Encounter (HOSPITAL_COMMUNITY)
Admission: RE | Admit: 2022-12-22 | Discharge: 2022-12-22 | Disposition: A | Discharge status: Home / Self Care | Payer: Self-pay | Source: Ambulatory Visit | Attending: Internal Medicine | Admitting: Internal Medicine

## 2022-12-22 ENCOUNTER — Encounter (HOSPITAL_COMMUNITY): Payer: Self-pay

## 2022-12-22 VITALS — BP 124/81 | HR 68 | Temp 97.8°F | Resp 18 | Ht 65.0 in | Wt 289.9 lb

## 2022-12-22 DIAGNOSIS — F17200 Nicotine dependence, unspecified, uncomplicated: Secondary | ICD-10-CM

## 2022-12-22 DIAGNOSIS — Z01818 Encounter for other preprocedural examination: Secondary | ICD-10-CM

## 2022-12-22 DIAGNOSIS — E66813 Obesity, class 3: Secondary | ICD-10-CM

## 2022-12-22 HISTORY — DX: Anemia, unspecified: D64.9

## 2022-12-22 LAB — POCT PREGNANCY, URINE: Preg Test, Ur: NEGATIVE

## 2022-12-25 ENCOUNTER — Encounter (HOSPITAL_COMMUNITY): Payer: Self-pay | Admitting: Anesthesiology

## 2022-12-26 ENCOUNTER — Encounter (HOSPITAL_COMMUNITY): Admission: RE | Payer: Self-pay | Source: Home / Self Care

## 2022-12-26 ENCOUNTER — Ambulatory Visit (HOSPITAL_COMMUNITY): Admission: RE | Admit: 2022-12-26 | Payer: Medicaid Other | Source: Home / Self Care

## 2022-12-26 DIAGNOSIS — K625 Hemorrhage of anus and rectum: Secondary | ICD-10-CM

## 2022-12-26 SURGERY — COLONOSCOPY WITH PROPOFOL
Anesthesia: Monitor Anesthesia Care
